# Patient Record
Sex: Female | Born: 1945
Health system: Southern US, Community
[De-identification: ages and names within clinical notes are randomized; demographics above are authoritative.]

## PROBLEM LIST (undated history)

## (undated) DIAGNOSIS — E785 Hyperlipidemia, unspecified: Secondary | ICD-10-CM

## (undated) DIAGNOSIS — M797 Fibromyalgia: Secondary | ICD-10-CM

## (undated) DIAGNOSIS — M8580 Other specified disorders of bone density and structure, unspecified site: Secondary | ICD-10-CM

## (undated) DIAGNOSIS — I10 Essential (primary) hypertension: Secondary | ICD-10-CM

## (undated) DIAGNOSIS — I639 Cerebral infarction, unspecified: Secondary | ICD-10-CM

## (undated) DIAGNOSIS — M1990 Unspecified osteoarthritis, unspecified site: Secondary | ICD-10-CM

## (undated) DIAGNOSIS — H269 Unspecified cataract: Secondary | ICD-10-CM

## (undated) DIAGNOSIS — H409 Unspecified glaucoma: Secondary | ICD-10-CM

## (undated) HISTORY — DX: Essential (primary) hypertension: I10

## (undated) HISTORY — DX: Unspecified osteoarthritis, unspecified site: M19.90

## (undated) HISTORY — PX: CATARACT EXTRACTION: SUR2

## (undated) HISTORY — DX: Cerebral infarction, unspecified: I63.9

## (undated) HISTORY — DX: Other specified disorders of bone density and structure, unspecified site: M85.80

## (undated) HISTORY — PX: CYSTOCELE REPAIR: SHX163

## (undated) HISTORY — DX: Unspecified cataract: H26.9

## (undated) HISTORY — DX: Unspecified glaucoma: H40.9

## (undated) HISTORY — PX: ROTATOR CUFF REPAIR: SHX139

## (undated) HISTORY — PX: ABDOMINAL HYSTERECTOMY: SHX81

## (undated) HISTORY — PX: COLONOSCOPY: SHX174

## (undated) HISTORY — DX: Hyperlipidemia, unspecified: E78.5

---

## 2008-03-29 LAB — CONVERTED CEMR LAB: Pap Smear: NORMAL

## 2008-04-07 ENCOUNTER — Encounter: Payer: Self-pay | Admitting: Internal Medicine

## 2009-11-30 ENCOUNTER — Other Ambulatory Visit: Admission: RE | Admit: 2009-11-30 | Discharge: 2009-11-30 | Payer: Self-pay | Admitting: Internal Medicine

## 2009-11-30 ENCOUNTER — Ambulatory Visit: Payer: Self-pay | Admitting: Internal Medicine

## 2009-11-30 DIAGNOSIS — F329 Major depressive disorder, single episode, unspecified: Secondary | ICD-10-CM

## 2009-11-30 DIAGNOSIS — M810 Age-related osteoporosis without current pathological fracture: Secondary | ICD-10-CM | POA: Insufficient documentation

## 2009-11-30 DIAGNOSIS — F3289 Other specified depressive episodes: Secondary | ICD-10-CM | POA: Insufficient documentation

## 2009-11-30 DIAGNOSIS — I10 Essential (primary) hypertension: Secondary | ICD-10-CM | POA: Insufficient documentation

## 2009-11-30 DIAGNOSIS — E785 Hyperlipidemia, unspecified: Secondary | ICD-10-CM | POA: Insufficient documentation

## 2009-11-30 LAB — CONVERTED CEMR LAB
ALT: 20 units/L (ref 0–35)
AST: 21 units/L (ref 0–37)
Albumin: 4.7 g/dL (ref 3.5–5.2)
Alkaline Phosphatase: 66 units/L (ref 39–117)
BUN: 18 mg/dL (ref 6–23)
Basophils Absolute: 0 10*3/uL (ref 0.0–0.1)
Basophils Relative: 0.7 % (ref 0.0–3.0)
Bilirubin, Direct: <0.1 mg/dL (ref 0.0–0.3)
CO2: 17 meq/L — ABNORMAL LOW (ref 19–32)
Calcium: 9.8 mg/dL (ref 8.4–10.5)
Chloride: 106 meq/L (ref 96–112)
Cholesterol, target level: 200 mg/dL
Cholesterol: 197 mg/dL (ref 0–200)
Creatinine, Ser: 0.82 mg/dL (ref 0.40–1.20)
Eosinophils Absolute: 0.1 10*3/uL (ref 0.0–0.7)
Glucose, Bld: 71 mg/dL (ref 70–99)
Hemoglobin, Urine: NEGATIVE
Hemoglobin: 13.3 g/dL (ref 12.0–15.0)
Ketones, ur: NEGATIVE mg/dL
LDL Goal: 130 mg/dL
Leukocytes, UA: NEGATIVE
MCHC: 33.1 g/dL (ref 30.0–36.0)
Monocytes Relative: 9.2 % (ref 3.0–12.0)
Neutrophils Relative %: 51.9 % (ref 43.0–77.0)
Platelets: 186 10*3/uL (ref 150.0–400.0)
Potassium: 4 meq/L (ref 3.5–5.3)
RDW: 13.1 % (ref 11.5–14.6)
Specific Gravity, Urine: 1.025 (ref 1.000–1.030)
Total Protein, Urine: NEGATIVE mg/dL
Total Protein: 7.8 g/dL (ref 6.0–8.3)
pH: 5.5 (ref 5.0–8.0)

## 2009-12-02 ENCOUNTER — Encounter: Payer: Self-pay | Admitting: Internal Medicine

## 2009-12-04 ENCOUNTER — Telehealth (INDEPENDENT_AMBULATORY_CARE_PROVIDER_SITE_OTHER): Payer: Self-pay | Admitting: *Deleted

## 2009-12-05 ENCOUNTER — Encounter: Payer: Self-pay | Admitting: Internal Medicine

## 2009-12-13 ENCOUNTER — Telehealth (INDEPENDENT_AMBULATORY_CARE_PROVIDER_SITE_OTHER): Payer: Self-pay | Admitting: *Deleted

## 2009-12-19 ENCOUNTER — Encounter: Admission: RE | Admit: 2009-12-19 | Discharge: 2009-12-19 | Payer: Self-pay | Admitting: Internal Medicine

## 2009-12-19 LAB — HM MAMMOGRAPHY

## 2010-03-20 ENCOUNTER — Ambulatory Visit: Payer: Self-pay | Admitting: Internal Medicine

## 2010-03-20 DIAGNOSIS — J3089 Other allergic rhinitis: Secondary | ICD-10-CM | POA: Insufficient documentation

## 2010-03-20 DIAGNOSIS — J019 Acute sinusitis, unspecified: Secondary | ICD-10-CM | POA: Insufficient documentation

## 2010-05-13 ENCOUNTER — Telehealth: Payer: Self-pay | Admitting: Internal Medicine

## 2010-12-17 NOTE — Progress Notes (Signed)
Summary: Medical authorization received  Medical authorization received and faxed to Dr. Weldon Picking @ [phone]. Wilder Glade  December 04, 2009 4:47 PM

## 2010-12-17 NOTE — Assessment & Plan Note (Signed)
Summary: sore throat, achey-lb   Vital Signs:  Patient profile:   65 year old female Height:      64 inches Weight:      144 pounds BMI:     24.81 O2 Sat:      94 % on Room air Temp:     98.5 degrees F oral Pulse rate:   56 / minute Pulse rhythm:   regular Resp:     16 per minute BP sitting:   140 / 82  (left arm) Cuff size:   large  Vitals Entered By: Rock Nephew CMA (Mar 20, 2010 4:00 PM)  O2 Flow:  Room air CC: allergies, headache, Bilateral ear pressure, sore throat, URI symptoms Is Patient Diabetic? No Pain Assessment Patient in pain? no        Primary Care Provider:  Etta Grandchild MD  CC:  allergies, headache, Bilateral ear pressure, sore throat, and URI symptoms.  History of Present Illness:  URI Symptoms      This is a 65 year old woman who presents with URI symptoms.  The symptoms began 5 days ago.  The severity is described as mild.  The patient reports nasal congestion, purulent nasal discharge, sore throat, and productive cough, but denies earache and sick contacts.  The patient denies fever, stiff neck, dyspnea, wheezing, rash, vomiting, diarrhea, use of an antipyretic, and response to antipyretic.  The patient also reports itchy throat, sneezing, and seasonal symptoms.  The patient denies headache, muscle aches, and severe fatigue.    Preventive Screening-Counseling & Management  Alcohol-Tobacco     Alcohol drinks/day: 1     Alcohol type: wine     >5/day in last 3 mos: no     Alcohol Counseling: not indicated; use of alcohol is not excessive or problematic     Feels need to cut down: no     Feels annoyed by complaints: no     Feels guilty re: drinking: no     Needs 'eye opener' in am: no     Smoking Status: never  Caffeine-Diet-Exercise     Does Patient Exercise: no  Hep-HIV-STD-Contraception     Hepatitis Risk: no risk noted     HIV Risk: no risk noted     STD Risk: no risk noted     SBE monthly: yes     SBE Education/Counseling: to  perform regular SBE      Sexual History:  currently monogamous.        Drug Use:  no.        Blood Transfusions:  no.    Medications Prior to Update: 1)  Lotrel 5-10 Mg Caps (Amlodipine Besy-Benazepril Hcl) .... Once Daily 2)  Lumigan 0.03 % Soln (Bimatoprost) 3)  Estradiol 0.5 Mg Tabs (Estradiol) .... Once Daily 4)  Simvastatin 20 Mg Tabs (Simvastatin) .... Once Daily 5)  Baby Asa 6)  Vitamin D3 2000iu 7)  Citalopram Hydrobromide 20 Mg Tabs (Citalopram Hydrobromide) .... Once Daily 8)  Atelvia .... One By Mouth Once A Week For Osteoporosis  Current Medications (verified): 1)  Lotrel 5-10 Mg Caps (Amlodipine Besy-Benazepril Hcl) .... Once Daily 2)  Lumigan 0.03 % Soln (Bimatoprost) 3)  Estradiol 0.5 Mg Tabs (Estradiol) .... Once Daily 4)  Simvastatin 20 Mg Tabs (Simvastatin) .... Once Daily 5)  Baby Asa 6)  Vitamin D3 2000iu 7)  Citalopram Hydrobromide 20 Mg Tabs (Citalopram Hydrobromide) .... Once Daily 8)  Cefuroxime Axetil 500 Mg Tabs (Cefuroxime Axetil) .... One By  Mouth Two Times A Day For 10 Days  Allergies (verified): 1)  ! Erythromycin  Past History:  Past Medical History: Reviewed history from 11/30/2009 and no changes required. Hyperlipidemia Hypertension Osteoporosis  Family History: n/a  Social History: Married Never Smoked Alcohol use-no Drug use-no Regular exercise-no Drug Use:  no Does Patient Exercise:  no  Review of Systems  The patient denies anorexia, chest pain, dyspnea on exertion, prolonged cough, headaches, hemoptysis, abdominal pain, hematuria, difficulty walking, depression, and enlarged lymph nodes.    Physical Exam  General:  alert, well-developed, well-nourished, well-hydrated, appropriate dress, normal appearance, healthy-appearing, cooperative to examination, and good hygiene.   Head:  normocephalic, atraumatic, no abnormalities observed, and no abnormalities palpated.   Ears:  R ear normal and L ear normal.   Nose:  no airflow  obstruction, no intranasal foreign body, no nasal polyps, no nasal mucosal lesions, no mucosal friability, no active bleeding or clots, no sinus percussion tenderness, no septum abnormalities, nasal dischargemucosal pallor, and mucosal edema.   Mouth:  no exudates, no posterior lymphoid hypertrophy, no postnasal drip, no pharyngeal crowing, no lesions, no aphthous ulcers, no erosions, no tongue abnormalities, no leukoplakia, and pharyngeal erythema.   Neck:  No deformities, masses, or tenderness noted. Lungs:  Normal respiratory effort, chest expands symmetrically. Lungs are clear to auscultation, no crackles or wheezes. Heart:  Normal rate and regular rhythm. S1 and S2 normal without gallop, murmur, click, rub or other extra sounds. Abdomen:  Bowel sounds positive,abdomen soft and non-tender without masses, organomegaly or hernias noted. Msk:  No deformity or scoliosis noted of thoracic or lumbar spine.   Pulses:  R and L carotid,radial,femoral,dorsalis pedis and posterior tibial pulses are full and equal bilaterally Extremities:  No clubbing, cyanosis, edema, or deformity noted with normal full range of motion of all joints.   Neurologic:  No cranial nerve deficits noted. Station and gait are normal. Plantar reflexes are down-going bilaterally. DTRs are symmetrical throughout. Sensory, motor and coordinative functions appear intact. Skin:  Intact without suspicious lesions or rashes Cervical Nodes:  no anterior cervical adenopathy and no posterior cervical adenopathy.   Psych:  Cognition and judgment appear intact. Alert and cooperative with normal attention span and concentration. No apparent delusions, illusions, hallucinations   Impression & Recommendations:  Problem # 1:  SINUSITIS- ACUTE-NOS (ICD-461.9) Assessment New continue otc anti-histamines and decongestants Her updated medication list for this problem includes:    Cefuroxime Axetil 500 Mg Tabs (Cefuroxime axetil) ..... One by mouth  two times a day for 10 days  Instructed on treatment. Call if symptoms persist or worsen.   Problem # 2:  ALLERGIC RHINITIS DUE TO OTHER ALLERGEN (ICD-477.8) Assessment: New  Orders: Depo- Medrol 80mg  (J1040) Admin of Therapeutic Inj  intramuscular or subcutaneous (09811)  Complete Medication List: 1)  Lotrel 5-10 Mg Caps (Amlodipine besy-benazepril hcl) .... Once daily 2)  Lumigan 0.03 % Soln (Bimatoprost) 3)  Estradiol 0.5 Mg Tabs (Estradiol) .... Once daily 4)  Simvastatin 20 Mg Tabs (Simvastatin) .... Once daily 5)  Baby Asa  6)  Vitamin D3 2000iu  7)  Citalopram Hydrobromide 20 Mg Tabs (Citalopram hydrobromide) .... Once daily 8)  Cefuroxime Axetil 500 Mg Tabs (Cefuroxime axetil) .... One by mouth two times a day for 10 days  Patient Instructions: 1)  Please schedule a follow-up appointment in 1 month. 2)  Acute sinusitis symptoms for less than 10 days are not helped by antibiotics.Use warm moist compresses, and over the counter decongestants ( only  as directed). Call if no improvement in 5-7 days, sooner if increasing pain, fever, or new symptoms. Prescriptions: CEFUROXIME AXETIL 500 MG TABS (CEFUROXIME AXETIL) One by mouth two times a day for 10 days  #20 x 0   Entered and Authorized by:   Etta Grandchild MD   Signed by:   Etta Grandchild MD on 03/20/2010   Method used:   Electronically to        [facility] [phone]* (retail)       [address]       [place], [place]  [address]       Ph: [phone] or [phone]       Fax: [phone]   RxID:   [id number]    Medication Administration  Injection # 1:    Medication: Depo- Medrol 80mg     Diagnosis: ALLERGIC RHINITIS DUE TO OTHER ALLERGEN (ICD-477.8)    Route: IM    Site: LUOQ gluteus    Exp Date: 09/17/2012    Lot #: [id number]    Mfr: [employer]    Comments: Patient received a total of 120mg  Depo.    Patient tolerated injection without complications    Given by: Lucious Groves  (Mar 20, 2010 4:20 PM)  Injection # 2:    Medication: Depo- Medrol 40mg     Diagnosis: ALLERGIC RHINITIS DUE TO OTHER ALLERGEN (ICD-477.8)    Route: IM    Site: LUOQ gluteus    Exp Date: 09/17/2012    Lot #: [id number]    Mfr: [employer]  Orders Added: 1)  Depo- Medrol 80mg  [J1040] 2)  Admin of Therapeutic Inj  intramuscular or subcutaneous [96372] 3)  Est. Patient Level IV [43329]

## 2010-12-17 NOTE — Assessment & Plan Note (Signed)
Summary: NEW PT PHY W/PAP--NO GYN--BCBS-#-PKG-STC--RS'D/CD   Vital Signs:  Patient profile:   65 year old female Height:      64 inches Weight:      142 pounds BMI:     24.46 O2 Sat:      94 % on Room air Temp:     98.2 degrees F oral Pulse rate:   51 / minute Pulse rhythm:   regular Resp:     16 per minute BP sitting:   112 / 82  (left arm) Cuff size:   large  Vitals Entered By: Rock Nephew CMA (November 30, 2009 10:33 AM)  O2 Flow:  Room air  Primary Care Provider:  Etta Grandchild MD   History of Present Illness: New to me for a complete physical. 1. She has osteoporosis and wants to restart meds for this 2. She is having anxiety and insomnia (FA's) and wants to restart Citalopram  Depression History:      The patient presents with symptoms of depression which have been present for greater than two weeks.  The patient is having a depressed mood most of the day and has a diminished interest in her usual daily activities.  Positive alarm features for depression include insomnia, fatigue (loss of energy), and feelings of worthlessness (guilt).  However, she denies significant weight loss, significant weight gain, hypersomnia, psychomotor agitation, psychomotor retardation, impaired concentration (indecisiveness), and recurrent thoughts of death or suicide.  The patient denies symptoms of a manic disorder including persistently & abnormally elevated mood, abnormally & persistently irritable mood, less need for sleep, talkative or feels need to keep talking, distractibility, increase in goal-directed activity, psychomotor agitation, inflated self-esteem or grandiosity, excessive buying sprees, and excessive sexual indiscretions.        Psychosocial stress factors include a recent divorce and major life changes.  Risk factors for depression include a personal history of depression.  The patient denies that she feels like life is not worth living, denies that she wishes that she were  dead, and denies that she has thought about ending her life.         Depression Treatment History:  Prior Medication Used:   Start Date: Assessment of Effect:   Comments:  Celexa (citalopram)     11/30/2007   much improvement     --  Hypertension History:      She denies headache, chest pain, palpitations, dyspnea with exertion, orthopnea, PND, peripheral edema, visual symptoms, neurologic problems, syncope, and side effects from treatment.  She notes no problems with any antihypertensive medication side effects.        Positive major cardiovascular risk factors include female age 25 years old or older, hyperlipidemia, and hypertension.  Negative major cardiovascular risk factors include no history of diabetes, negative family history for ischemic heart disease, and non-tobacco-user status.        Further assessment for target organ damage reveals no history of ASHD, cardiac end-organ damage (CHF/LVH), stroke/TIA, peripheral vascular disease, renal insufficiency, or hypertensive retinopathy.    Lipid Management History:      Positive NCEP/ATP III risk factors include female age 38 years old or older and hypertension.  Negative NCEP/ATP III risk factors include non-diabetic, no family history for ischemic heart disease, non-tobacco-user status, no ASHD (atherosclerotic heart disease), no prior stroke/TIA, no peripheral vascular disease, and no history of aortic aneurysm.        The patient states that she knows about the "Therapeutic Lifestyle Change" diet.  Her  compliance with the TLC diet is excellent.  The patient expresses understanding of adjunctive measures for cholesterol lowering.  Adjunctive measures started by the patient include aerobic exercise, fiber, ASA, omega-3 supplements, limit alcohol consumpton, and weight reduction.  She expresses no side effects from her lipid-lowering medication.  The patient denies any symptoms to suggest myopathy or liver disease.       Preventive  Screening-Counseling & Management  Alcohol-Tobacco     Alcohol drinks/day: 1     Alcohol type: wine     >5/day in last 3 mos: no     Alcohol Counseling: not indicated; use of alcohol is not excessive or problematic     Feels need to cut down: no     Feels annoyed by complaints: no     Feels guilty re: drinking: no     Needs 'eye opener' in am: no     Smoking Status: never  Caffeine-Diet-Exercise     Does Patient Exercise: yes     Type of exercise: cv     Exercise (avg: min/session): 40     Times/week: 4  Hep-HIV-STD-Contraception     Hepatitis Risk: no risk noted     HIV Risk: no risk noted     STD Risk: no risk noted     SBE monthly: yes     SBE Education/Counseling: to perform regular SBE      Sexual History:  currently monogamous.        Drug Use:  never.        Blood Transfusions:  no.    Current Medications (verified): 1)  Lotrel 5-10 Mg Caps (Amlodipine Besy-Benazepril Hcl) 2)  Lumigan 0.03 % Soln (Bimatoprost) 3)  Estradiol 0.5 Mg Tabs (Estradiol) 4)  Simvastatin 20 Mg Tabs (Simvastatin) 5)  Baby Asa 6)  Vitamin D3 2000iu  Allergies (verified): 1)  ! Erythromycin  Past History:  Past Medical History: Hyperlipidemia Hypertension Osteoporosis  Social History: Smoking Status:  never Does Patient Exercise:  yes Hepatitis Risk:  no risk noted HIV Risk:  no risk noted STD Risk:  no risk noted Sexual History:  currently monogamous Drug Use:  never Blood Transfusions:  no  Review of Systems       The patient complains of depression.  The patient denies anorexia, fever, weight loss, weight gain, chest pain, syncope, dyspnea on exertion, peripheral edema, prolonged cough, headaches, hemoptysis, abdominal pain, melena, hematochezia, severe indigestion/heartburn, hematuria, incontinence, muscle weakness, suspicious skin lesions, difficulty walking, enlarged lymph nodes, angioedema, and breast masses.    Physical Exam  General:  alert, well-developed,  well-nourished, well-hydrated, appropriate dress, normal appearance, healthy-appearing, cooperative to examination, and good hygiene.   Head:  normocephalic, atraumatic, no abnormalities observed, and no abnormalities palpated.   Eyes:  No corneal or conjunctival inflammation noted. EOMI. Perrla. Funduscopic exam benign, without hemorrhages, exudates or papilledema. Vision grossly normal. Mouth:  Oral mucosa and oropharynx without lesions or exudates.  Teeth in good repair. Neck:  supple, full ROM, no masses, no thyromegaly, no thyroid nodules or tenderness, no JVD, normal carotid upstroke, no carotid bruits, no cervical lymphadenopathy, and no neck tenderness.   Breasts:  No mass, nodules, thickening, tenderness, bulging, retraction, inflamation, nipple discharge or skin changes noted.   Lungs:  normal respiratory effort, no intercostal retractions, no accessory muscle use, normal breath sounds, no dullness, no fremitus, no crackles, and no wheezes.   Heart:  normal rate, regular rhythm, no murmur, no gallop, and no rub.   Abdomen:  soft,  non-tender, normal bowel sounds, no distention, no masses, no guarding, no rigidity, no rebound tenderness, no hepatomegaly, no splenomegaly, and abdominal scar(s).   Rectal:  No external abnormalities noted. Normal sphincter tone. No rectal masses or tenderness. heme negative stool. Genitalia:  normal introitus, no external lesions, no vaginal discharge, mucosa pink and moist, no friaility or hemorrhage, and no adnexal masses or tenderness.   Msk:  normal ROM, no joint tenderness, no joint swelling, no joint warmth, no redness over joints, no joint deformities, no joint instability, and no crepitation.   Pulses:  R and L carotid,radial,femoral,dorsalis pedis and posterior tibial pulses are full and equal bilaterally Extremities:  No clubbing, cyanosis, edema, or deformity noted with normal full range of motion of all joints.   Neurologic:  No cranial nerve deficits  noted. Station and gait are normal. Plantar reflexes are down-going bilaterally. DTRs are symmetrical throughout. Sensory, motor and coordinative functions appear intact. Skin:  turgor normal, color normal, no rashes, no suspicious lesions, no ecchymoses, no petechiae, no purpura, no ulcerations, and no edema.   Cervical Nodes:  no anterior cervical adenopathy and no posterior cervical adenopathy.   Axillary Nodes:  no R axillary adenopathy and no L axillary adenopathy.   Inguinal Nodes:  no R inguinal adenopathy and no L inguinal adenopathy.   Psych:  Oriented X3, memory intact for recent and remote, good eye contact, not anxious appearing, not depressed appearing, not agitated, not suicidal, not homicidal, and subdued.   Additional Exam:  EKG is normal.   Impression & Recommendations:  Problem # 1:  ROUTINE GENERAL MEDICAL EXAM@HEALTH  CARE FACL (ICD-V70.0) Assessment New  Orders: Radiology Referral (Radiology) Hemoccult Guaiac-1 spec.(in office) (82270) EKG w/ Interpretation (93000)  Mammogram: Normal Bilateral (04/04/2008) Pap smear: Normal (03/29/2008) Colonoscopy: Normal (03/23/2007) Bone Density: Osteoporosis (04/05/2008)  Discussed using sunscreen, use of alcohol, drug use, self breast exam, routine dental care, routine eye care, schedule for GYN exam, routine physical exam, seat belts, multiple vitamins, osteoporosis prevention, adequate calcium intake in diet, recommendations for immunizations, mammograms and Pap smears.  Discussed exercise and checking cholesterol.    Problem # 2:  OSTEOPOROSIS (ICD-733.00)  Orders: T-Bone Densitometry (16109) Venipuncture (60454) TLB-Lipid Panel (80061-LIPID) TLB-BMP (Basic Metabolic Panel-BMET) (80048-METABOL) TLB-CBC Platelet - w/Differential (85025-CBCD) TLB-Hepatic/Liver Function Pnl (80076-HEPATIC) TLB-TSH (Thyroid Stimulating Hormone) (84443-TSH) TLB-Udip w/ Micro (81001-URINE) T-Vitamin D (25-Hydroxy) (09811-91478)  Discussed  medication use, applications of heat or ice, and exercises.   Problem # 3:  HYPERTENSION (ICD-401.9) Assessment: Improved  Her updated medication list for this problem includes:    Lotrel 5-10 Mg Caps (Amlodipine besy-benazepril hcl) ..... Once daily  Orders: Venipuncture (29562) TLB-Lipid Panel (80061-LIPID) TLB-BMP (Basic Metabolic Panel-BMET) (80048-METABOL) TLB-CBC Platelet - w/Differential (85025-CBCD) TLB-Hepatic/Liver Function Pnl (80076-HEPATIC) TLB-TSH (Thyroid Stimulating Hormone) (84443-TSH) TLB-Udip w/ Micro (81001-URINE) T-Vitamin D (25-Hydroxy) (13086-57846)  Problem # 4:  HYPERLIPIDEMIA (ICD-272.4) Assessment: Unchanged  Her updated medication list for this problem includes:    Simvastatin 20 Mg Tabs (Simvastatin) ..... Once daily  Orders: Venipuncture (96295) TLB-Lipid Panel (80061-LIPID) TLB-BMP (Basic Metabolic Panel-BMET) (80048-METABOL) TLB-CBC Platelet - w/Differential (85025-CBCD) TLB-Hepatic/Liver Function Pnl (80076-HEPATIC) TLB-TSH (Thyroid Stimulating Hormone) (84443-TSH) TLB-Udip w/ Micro (81001-URINE) T-Vitamin D (25-Hydroxy) (28413-24401)  Problem # 5:  DEPRESSIVE DISORDER (ICD-311) Assessment: Deteriorated  Her updated medication list for this problem includes:    Citalopram Hydrobromide 20 Mg Tabs (Citalopram hydrobromide) ..... Once daily  Discussed treatment options, including trial of antidpressant medication. Will refer to behavioral health. Follow-up call in in 24-48 hours and recheck  in 2 weeks, sooner as needed. Patient agrees to call if any worsening of symptoms or thoughts of doing harm arise. Verified that the patient has no suicidal ideation at this time.   Complete Medication List: 1)  Lotrel 5-10 Mg Caps (Amlodipine besy-benazepril hcl) .... Once daily 2)  Lumigan 0.03 % Soln (Bimatoprost) 3)  Estradiol 0.5 Mg Tabs (Estradiol) .... Once daily 4)  Simvastatin 20 Mg Tabs (Simvastatin) .... Once daily 5)  Baby Asa  6)  Vitamin  D3 2000iu  7)  Citalopram Hydrobromide 20 Mg Tabs (Citalopram hydrobromide) .... Once daily 8)  Atelvia  .... One by mouth once a week for osteoporosis  Hypertension Assessment/Plan:      The patient's hypertensive risk group is category B: At least one risk factor (excluding diabetes) with no target organ damage.  Today's blood pressure is 112/82.  Her blood pressure goal is < 140/90.  Lipid Assessment/Plan:      Based on NCEP/ATP III, the patient's risk factor category is "2 or more risk factors and a calculated 10 year CAD risk of > 20%".  The patient's lipid goals are as follows: Total cholesterol goal is 200; LDL cholesterol goal is 130; HDL cholesterol goal is 40; Triglyceride goal is 150.    Colorectal Screening:  Current Recommendations:    Hemoccult: NEG X 1 today  Colonoscopy Results:    Date of Exam: 03/23/2007    Results: Normal  PAP Screening:    Hx Cervical Dysplasia in last 5 yrs? No    3 normal PAP smears in last 5 yrs? Yes    Last PAP smear:  03/29/2008  PAP Smear Results:    Date of Exam:  03/29/2008    Results:  Normal  Mammogram Screening:    Last Mammogram:  04/04/2008  Mammogram Results:    Date of Exam:  04/04/2008    Results:  Normal Bilateral  Osteoporosis Risk Assessment:  Risk Factors for Fracture or Low Bone Density:   Race (White or Asian):     yes   Hx of Fractures:       no   FH of Osteoporosis:     yes   Hx of falls:       no   Physically inactive:     no   Smoking status:       never   High alcohol use:     no   High caffeine use:     no   Low calcium/Vit. D intake:   yes   Corticosteroid use:     no   Thyroid replacement:     no   Dilantin use:       no   Heparin use:       no   Thyroid disease:     no   Rheumatoid Arthritis:     no   Parathyroid disease:     no  Bone Density (DEXA Scan) Results:    Date of Exam:  04/05/2008    Results:  Osteoporosis  Hormone Replacement Therapy Discussion:    Reviewed risks and benefits of  hormone replacement therapy; based on discussion patient wants to continue HRT.  Patient Instructions: 1)  Please schedule a follow-up appointment in 4 months. 2)  It is important that you exercise regularly at least 20 minutes 5 times a week. If you develop chest pain, have severe difficulty breathing, or feel very tired , stop exercising immediately and seek medical attention. 3)  Schedule your  mammogram. 4)  You need to have a Pap Smear to prevent cervical cancer. 5)  Take calcium +Vitamin D daily. 6)  Check your Blood Pressure regularly. If it is above 140/90: you should make an appointment. Prescriptions: SIMVASTATIN 20 MG TABS (SIMVASTATIN) once daily  #90 x 3   Entered and Authorized by:   Etta Grandchild MD   Signed by:   Etta Grandchild MD on 11/30/2009   Method used:   Electronically to        [facility]* (mail-order)             ,          Ph: [phone]       Fax: [phone]   RxID:   [id number] ESTRADIOL 0.5 MG TABS (ESTRADIOL) once daily  #90 x 3   Entered and Authorized by:   Etta Grandchild MD   Signed by:   Etta Grandchild MD on 11/30/2009   Method used:   Electronically to        [facility]* (mail-order)             ,          Ph: [phone]       Fax: [phone]   RxID:   [id number] LOTREL 5-10 MG CAPS (AMLODIPINE BESY-BENAZEPRIL HCL) once daily  #90 x 3   Entered and Authorized by:   Etta Grandchild MD   Signed by:   Etta Grandchild MD on 11/30/2009   Method used:   Electronically to        [facility]* (mail-order)             ,          Ph: [phone]       Fax: [phone]   RxID:   [id number] ATELVIA One by mouth once a week for osteoporosis  #6 x 0   Entered and Authorized by:   Etta Grandchild MD   Signed by:   Etta Grandchild MD on 11/30/2009   Method used:   Samples Given   RxID:   [id number] CITALOPRAM HYDROBROMIDE 20 MG TABS (CITALOPRAM HYDROBROMIDE) once daily  #30 x 11   Entered and Authorized by:    Etta Grandchild MD   Signed by:   Etta Grandchild MD on 11/30/2009   Method used:   Print then Give to Patient   RxID:   [id number]   Preventive Care Screening  Pap Smear:    Date:  03/17/2008    Results:  normal   Mammogram:    Date:  04/07/2000    Results:  normal      Appended Document: Orders Update    Clinical Lists Changes  Orders: Added new Test order of T- * Misc. Laboratory test [phone]) - Signed

## 2010-12-17 NOTE — Progress Notes (Signed)
Summary: REFILL   Phone Note Refill Request Call back at Home Phone [phone]   Refills Requested: Medication #1:  CITALOPRAM HYDROBROMIDE 20 MG TABS once daily. Patient is requesting rx to go to [facility] and would like a call back when complete. Ok for year of refills?   Initial call taken by: Lamar Sprinkles, CMA,  May 13, 2010 9:38 AM  Follow-up for Phone Call        yes Follow-up by: Etta Grandchild MD,  May 13, 2010 11:16 AM  Additional Follow-up for Phone Call Additional follow up Details #1::        Pt informed  Additional Follow-up by: Lamar Sprinkles, CMA,  May 13, 2010 2:56 PM    Prescriptions: CITALOPRAM HYDROBROMIDE 20 MG TABS (CITALOPRAM HYDROBROMIDE) once daily  #90 x 3   Entered by:   Lamar Sprinkles, CMA   Authorized by:   Etta Grandchild MD   Signed by:   Lamar Sprinkles, CMA on 05/13/2010   Method used:   Faxed to ...       [facility] (mail-order)             , [place]         Ph:        Fax: [phone]   RxID:   [id number]

## 2010-12-17 NOTE — Letter (Signed)
Summary: Lipid Letter  [facility]  [address] [place], [place] [address]   Phone: [phone]  Fax: [phone]    12/02/2009  Pam Scott [address] [place], [place]  [address]  Dear Pam Scott:  We have carefully reviewed your last lipid profile from 11/30/2009 and the results are noted below with a summary of recommendations for lipid management.    Cholesterol:       197     Goal: <200   HDL "good" Cholesterol:   89     Goal: >40   LDL "bad" Cholesterol:   98     Goal: <130   Triglycerides:       51     Goal: <150 EXCELLENT RESULTS   kidney and liver tests are normal and your Vitamin D level is good.    TLC Diet (Therapeutic Lifestyle Change): Saturated Fats & Transfatty acids should be kept < 7% of total calories ***Reduce Saturated Fats Polyunstaurated Fat can be up to 10% of total calories Monounsaturated Fat Fat can be up to 20% of total calories Total Fat should be no greater than 25-35% of total calories Carbohydrates should be 50-60% of total calories Protein should be approximately 15% of total calories Fiber should be at least 20-30 grams a day ***Increased fiber may help lower LDL Total Cholesterol should be < 200mg /day Consider adding plant stanol/sterols to diet (example: Benacol spread) ***A higher intake of unsaturated fat may reduce Triglycerides and Increase HDL    Adjunctive Measures (may lower LIPIDS and reduce risk of Heart Attack) include: Aerobic Exercise (20-30 minutes 3-4 times a week) Limit Alcohol Consumption Weight Reduction Aspirin 75-81 mg a day by mouth (if not allergic or contraindicated) Dietary Fiber 20-30 grams a day by mouth     Current Medications: 1)    Lotrel 5-10 Mg Caps (Amlodipine besy-benazepril hcl) .... Once daily 2)    Lumigan 0.03 % Soln (Bimatoprost) 3)    Estradiol 0.5 Mg Tabs (Estradiol) .... Once daily 4)    Simvastatin 20 Mg Tabs (Simvastatin) .... Once daily 5)    Baby Asa    6)    Vitamin D3 2000iu  7)    Citalopram Hydrobromide 20 Mg Tabs (Citalopram hydrobromide) .... Once daily 8)    Atelvia  .... One by mouth once a week for osteoporosis  If you have any questions, please call. We appreciate being able to work with you.   Sincerely,    [facility] Etta Grandchild MD

## 2010-12-17 NOTE — Letter (Signed)
Summary: Results Follow-up Letter  [facility]  [address] [place], [place] [address]   Phone: [phone]  Fax: [phone]    12/05/2009  [address] [place], [place]  [address]  Dear Ms. Adderly,   The following are the results of your recent test(s):  Test     Result     Pap Smear    Normal____x___  Not Normal_____       Comments: _________________________________________________________   _________________________________________________________  Please call for an appointment as directed _________________________________________________________ _________________________________________________________ _________________________________________________________  Sincerely,  Sanda Linger MD [facility]

## 2010-12-17 NOTE — Progress Notes (Signed)
Summary: Record received  45 pages of records received from Dr. Sanjuana Kava office. Forwarded to Dr. Yetta Barre for review. [id number] aware.

## 2011-01-13 ENCOUNTER — Other Ambulatory Visit: Payer: Self-pay | Admitting: Internal Medicine

## 2011-01-13 DIAGNOSIS — Z1231 Encounter for screening mammogram for malignant neoplasm of breast: Secondary | ICD-10-CM

## 2011-01-22 ENCOUNTER — Ambulatory Visit
Admission: RE | Admit: 2011-01-22 | Discharge: 2011-01-22 | Disposition: A | Discharge status: Home / Self Care | Payer: BC Managed Care – PPO | Source: Ambulatory Visit | Attending: Internal Medicine | Admitting: Internal Medicine

## 2011-01-22 DIAGNOSIS — Z1231 Encounter for screening mammogram for malignant neoplasm of breast: Secondary | ICD-10-CM

## 2011-01-23 ENCOUNTER — Telehealth: Payer: Self-pay | Admitting: Internal Medicine

## 2011-01-28 NOTE — Progress Notes (Signed)
    PAP Screening:    Last PAP smear:  11/30/2009  Mammogram Screening:    Last Mammogram:  01/22/2011  Mammogram Results:    Date of Exam:  01/22/2011    Results:  Normal Bilateral  Osteoporosis Risk Assessment:  Risk Factors for Fracture or Low Bone Density:   Race (White or Asian):     yes   Hx of Fractures:       no   FH of Osteoporosis:     yes   Hx of falls:       no   Physically inactive:     no   Smoking status:       never   High alcohol use:     no   Low calcium/Vit. D intake:   yes   Corticosteroid use:     no   Heparin use:       no   Thyroid disease:     no   Rheumatoid Arthritis:     no

## 2011-03-01 ENCOUNTER — Other Ambulatory Visit: Payer: Self-pay | Admitting: Internal Medicine

## 2011-03-12 ENCOUNTER — Telehealth: Payer: Self-pay

## 2011-03-12 MED ORDER — AMLODIPINE BESY-BENAZEPRIL HCL 5-10 MG PO CAPS: 1.0000 | ORAL_CAPSULE | Freq: Every day | ORAL | Status: DC

## 2011-03-12 NOTE — Telephone Encounter (Signed)
Patient called due to medication being denied. She has made appt for cpx (04/02/11) and I advised that we will send in enough to last until her appt. I lmovm advising that due to this not being a 90day suppply it will be sent to [facility] unless she calls back and given a different local pharmacy

## 2011-03-17 ENCOUNTER — Ambulatory Visit: Payer: BC Managed Care – PPO | Admitting: Internal Medicine

## 2011-03-25 ENCOUNTER — Encounter: Payer: Self-pay | Admitting: Internal Medicine

## 2011-03-27 ENCOUNTER — Encounter: Payer: Self-pay | Admitting: Internal Medicine

## 2011-03-27 ENCOUNTER — Other Ambulatory Visit (INDEPENDENT_AMBULATORY_CARE_PROVIDER_SITE_OTHER): Payer: BC Managed Care – PPO

## 2011-03-27 ENCOUNTER — Other Ambulatory Visit (INDEPENDENT_AMBULATORY_CARE_PROVIDER_SITE_OTHER): Payer: BC Managed Care – PPO | Admitting: Internal Medicine

## 2011-03-27 ENCOUNTER — Ambulatory Visit (INDEPENDENT_AMBULATORY_CARE_PROVIDER_SITE_OTHER): Payer: BC Managed Care – PPO | Admitting: Internal Medicine

## 2011-03-27 DIAGNOSIS — M810 Age-related osteoporosis without current pathological fracture: Secondary | ICD-10-CM

## 2011-03-27 DIAGNOSIS — Z23 Encounter for immunization: Secondary | ICD-10-CM

## 2011-03-27 DIAGNOSIS — Z13220 Encounter for screening for lipoid disorders: Secondary | ICD-10-CM

## 2011-03-27 DIAGNOSIS — Z0000 Encounter for general adult medical examination without abnormal findings: Secondary | ICD-10-CM

## 2011-03-27 DIAGNOSIS — T63461A Toxic effect of venom of wasps, accidental (unintentional), initial encounter: Secondary | ICD-10-CM

## 2011-03-27 DIAGNOSIS — I10 Essential (primary) hypertension: Secondary | ICD-10-CM

## 2011-03-27 DIAGNOSIS — T63441A Toxic effect of venom of bees, accidental (unintentional), initial encounter: Secondary | ICD-10-CM

## 2011-03-27 DIAGNOSIS — E785 Hyperlipidemia, unspecified: Secondary | ICD-10-CM

## 2011-03-27 DIAGNOSIS — W57XXXA Bitten or stung by nonvenomous insect and other nonvenomous arthropods, initial encounter: Secondary | ICD-10-CM

## 2011-03-27 DIAGNOSIS — T148XXA Other injury of unspecified body region, initial encounter: Secondary | ICD-10-CM

## 2011-03-27 DIAGNOSIS — T6391XA Toxic effect of contact with unspecified venomous animal, accidental (unintentional), initial encounter: Secondary | ICD-10-CM

## 2011-03-27 DIAGNOSIS — T148 Other injury of unspecified body region: Secondary | ICD-10-CM

## 2011-03-27 LAB — URINALYSIS, ROUTINE W REFLEX MICROSCOPIC
Bilirubin Urine: NEGATIVE
Ketones, ur: NEGATIVE
Nitrite: NEGATIVE
Urine Glucose: NEGATIVE
pH: 6 (ref 5.0–8.0)

## 2011-03-27 LAB — COMPREHENSIVE METABOLIC PANEL
Albumin: 4.2 g/dL (ref 3.5–5.2)
BUN: 19 mg/dL (ref 6–23)
CO2: 27 mEq/L (ref 19–32)
Calcium: 9.3 mg/dL (ref 8.4–10.5)
Chloride: 105 mEq/L (ref 96–112)
GFR: 52.19 mL/min — ABNORMAL LOW (ref >60.00)
Glucose, Bld: 84 mg/dL (ref 70–99)
Potassium: 4.2 mEq/L (ref 3.5–5.1)
Sodium: 141 mEq/L (ref 135–145)

## 2011-03-27 LAB — LIPID PANEL
Cholesterol: 230 mg/dL — ABNORMAL HIGH (ref 0–200)
Total CHOL/HDL Ratio: 3
Triglycerides: 57 mg/dL (ref 0.0–149.0)
VLDL: 11.4 mg/dL (ref 0.0–40.0)

## 2011-03-27 LAB — CBC WITH DIFFERENTIAL/PLATELET
Basophils Relative: 0.5 % (ref 0.0–3.0)
Lymphocytes Relative: 27.8 % (ref 12.0–46.0)
Lymphs Abs: 1.6 10*3/uL (ref 0.7–4.0)
Monocytes Relative: 10.6 % (ref 3.0–12.0)
Neutro Abs: 3.4 10*3/uL (ref 1.4–7.7)
WBC: 5.9 10*3/uL (ref 4.5–10.5)

## 2011-03-27 LAB — TSH: TSH: 1.2 u[IU]/mL (ref 0.35–5.50)

## 2011-03-27 MED ORDER — SIMVASTATIN 20 MG PO TABS: 20.0000 mg | ORAL_TABLET | Freq: Every day | ORAL | Status: DC

## 2011-03-27 MED ORDER — METHYLPREDNISOLONE ACETATE 80 MG/ML IJ SUSP
120.0000 mg | Freq: Once | INTRAMUSCULAR | Status: AC
  Administered 2011-03-27: 120 mg via INTRAMUSCULAR

## 2011-03-27 MED ORDER — AMLODIPINE BESY-BENAZEPRIL HCL 5-10 MG PO CAPS: 1.0000 | ORAL_CAPSULE | Freq: Every day | ORAL | Status: DC

## 2011-03-27 NOTE — Progress Notes (Signed)
Subjective:    Patient ID: Pam Scott, female    DOB: 1946/01/15, 65 y.o.   MRN: [id number]  Poison [place] This is a new problem. The current episode started in the past 7 days. The problem has been gradually worsening since onset. The rash is diffuse. The rash is characterized by itchiness. She was exposed to a spider bite and plant contact. Pertinent negatives include no anorexia, congestion, cough, diarrhea, eye pain, facial edema, fatigue, fever, joint pain, nail changes, rhinorrhea, shortness of breath, sore throat or vomiting. Past treatments include antihistamine and topical steroids. The treatment provided mild relief.  Hypertension This is a chronic problem. The current episode started more than 1 year ago. The problem has been gradually improving since onset. The problem is controlled. Pertinent negatives include no anxiety, blurred vision, chest pain, headaches, malaise/fatigue, neck pain, orthopnea, palpitations, peripheral edema, PND, shortness of breath or sweats. There are no associated agents to hypertension. Past treatments include ACE inhibitors and calcium channel blockers. The current treatment provides significant improvement. There are no compliance problems.  There is no history of chronic renal disease.  Hyperlipidemia This is a chronic problem. The problem is controlled. Recent lipid tests were reviewed and are variable. She has no history of chronic renal disease, diabetes, hypothyroidism, liver disease, obesity or nephrotic syndrome. Factors aggravating her hyperlipidemia include estrogen. Pertinent negatives include no chest pain, myalgias or shortness of breath. Current antihyperlipidemic treatment includes statins. The current treatment provides moderate improvement of lipids. Compliance problems include medication cost.       Review of Systems  Constitutional: Negative for fever, chills, malaise/fatigue, diaphoresis, activity change, appetite change, fatigue and  unexpected weight change.  HENT: Negative for congestion, sore throat, facial swelling, rhinorrhea, trouble swallowing, neck pain and voice change.   Eyes: Negative for blurred vision and pain.  Respiratory: Negative for apnea, cough, choking, chest tightness, shortness of breath, wheezing and stridor.   Cardiovascular: Negative for chest pain, palpitations, orthopnea, leg swelling and PND.  Gastrointestinal: Negative for nausea, vomiting, abdominal pain, diarrhea, constipation, blood in stool, abdominal distention and anorexia.  Genitourinary: Negative for dysuria, urgency, frequency, hematuria, flank pain, decreased urine volume, vaginal bleeding, vaginal discharge, enuresis, difficulty urinating, genital sores, vaginal pain, menstrual problem, pelvic pain and dyspareunia.  Musculoskeletal: Negative for myalgias, back pain, joint pain, joint swelling, arthralgias and gait problem.  Skin: Positive for rash and wound (tick bite on right hip, husband took the tick out but the area is red and very itchy). Negative for nail changes, color change and pallor.  Neurological: Negative for dizziness, tremors, seizures, syncope, facial asymmetry, speech difficulty, weakness, light-headedness, numbness and headaches.  Hematological: Negative for adenopathy. Does not bruise/bleed easily.  Psychiatric/Behavioral: Negative.        Objective:   Physical Exam  Constitutional: She is oriented to person, place, and time. She appears well-developed and well-nourished. No distress.  HENT:  Head: Normocephalic and atraumatic.  Right Ear: External ear normal.  Left Ear: External ear normal.  Nose: Nose normal.  Mouth/Throat: Oropharynx is clear and moist. No oropharyngeal exudate.  Eyes: Conjunctivae and EOM are normal. Pupils are equal, round, and reactive to light. Right eye exhibits no discharge. Left eye exhibits no discharge. No scleral icterus.  Neck: Normal range of motion. Neck supple. No JVD present. No  tracheal deviation present. No thyromegaly present.  Cardiovascular: Normal rate, regular rhythm, normal heart sounds and intact distal pulses.  Exam reveals no gallop and no friction rub.   No murmur  heard. Pulmonary/Chest: Effort normal and breath sounds normal. No stridor. No respiratory distress. She has no wheezes. She has no rales. She exhibits no tenderness.  Abdominal: Soft. Bowel sounds are normal. She exhibits no distension and no mass. There is no tenderness. There is no rebound and no guarding.  Musculoskeletal: Normal range of motion. She exhibits no edema and no tenderness.  Lymphadenopathy:    She has no cervical adenopathy.  Neurological: She is alert and oriented to person, place, and time. She has normal reflexes. She displays normal reflexes. No cranial nerve deficit. She exhibits normal muscle tone. Coordination normal.  Skin: Skin is warm and dry. Abrasion, lesion and rash noted. No bruising, no burn, no ecchymosis, no laceration, no petechiae and no purpura noted. Rash is papular. Rash is not macular, not nodular, not pustular, not vesicular and not urticarial. She is not diaphoretic. There is erythema. No cyanosis. No pallor. Nails show no clubbing.       Area of tick bite on the right hip shows a central area of excoriation but no FB, there is a faint surrounding area of erythema but no warmth, exudate, ttp, fluctuance, induration.  The exposed areas on her arms show faint papular rash with slight excoriation and scaling, but no pustules/exudate/induration/warmth/streaking.  Psychiatric: She has a normal mood and affect. Her behavior is normal. Judgment and thought content normal.          Assessment & Plan:

## 2011-03-27 NOTE — Assessment & Plan Note (Addendum)
Tdap given, continue fluosinolide cream, gave depo-medrol IM in the office today. There is no evidence of infection or ECM to concern me about tick-borne infection.

## 2011-03-27 NOTE — Patient Instructions (Signed)
Hypertension (High Blood Pressure) As your heart beats, it forces blood through your arteries. This force is your blood pressure. If the pressure is too high, it is called hypertension (HTN) or high blood pressure. HTN is dangerous because you may have it and not know it. High blood pressure may mean that your heart has to work harder to pump blood. Your arteries may be narrow or stiff. The extra work puts you at risk for heart disease, stroke, and other problems.  Blood pressure consists of two numbers, a higher number over a lower, 110/72, for example. It is stated as "110 over 72." The ideal is below 120 for the top number (systolic) and under 80 for the bottom (diastolic). Write down your blood pressure today. You should pay close attention to your blood pressure if you have certain conditions such as:  Heart failure.  Prior heart attack.   Diabetes  Chronic kidney Hypercholesterolemia High Blood Cholesterol Cholesterol is a white, waxy, fat-like protein needed by your body in small amounts. The liver makes all the cholesterol you need. It is carried from the liver by the blood through the blood vessels. Deposits (plaque) may build up on blood vessel walls. This makes the arteries narrower and stiffer. Plaque increases the risk for heart attack and stroke. You cannot feel your cholesterol level even if it is very high. The only way to know is by a blood test to check your lipid (fats) levels. Once you know your cholesterol levels, you should keep a record of the test results. Work with your caregiver to to keep your levels in the desired range. WHAT THE RESULTS MEAN: Total cholesterol is a rough measure of all the cholesterol in your blood.  LDL is the so-called bad cholesterol. This is the type that deposits cholesterol in the walls of the arteries. You want this level to be low.  HDL is the good cholesterol because it cleans the arteries and carries the LDL away. You want this level to be high.   Triglycerides are fat that the body can either burn for energy or store. High levels are closely linked to heart disease.  DESIRED LEVELS: Total cholesterol below 200.  LDL below 100 for people at risk, below 70 for very high risk.  HDL above 50 is good, above 60 is best.  Triglycerides below 150.  HOW TO LOWER YOUR CHOLESTEROL: Diet.  Choose fish or white meat chicken and [place], roasted or baked. Limit fatty cuts of red meat, fried foods, and processed meats, such as sausage and lunch meat.  Eat lots of fresh fruits and vegetables. Choose whole grains, beans, pasta, potatoes and cereals.  Use only small amounts of olive, corn or canola oils. Avoid butter, mayonnaise, shortening or palm kernel oils. Avoid foods with trans-fats.  Use skim/nonfat milk and low-fat/nonfat yogurt and cheeses. Avoid whole milk, cream, ice cream, egg yolks and cheeses. Healthy desserts include angel food cake, gingersnaps, animal crackers, hard candy, popsicles, and low-fat/nonfat frozen yogurt. Avoid pastries, cakes, pies and cookies.  Exercise.  A regular program helps decrease LDL and raises HDL.  Helps with weight control.  Do things that increase your activity level like gardening, walking, or taking the stairs.  Medication.  May be prescribed by your caregiver to help lowering cholesterol and the risk for heart disease.  You may need medicine even if your levels are normal if you have several risk factors.  HOME CARE INSTRUCTIONS Follow your diet and exercise programs as suggested by  your caregiver.  Take medications as directed.  Have blood work done when your caregiver feels it is necessary.  MAKE SURE YOU:  Understand these instructions.  Will watch your condition.  Will get help right away if you are not doing well or get worse.  Document Released: 11/03/2005 Document Re-Released: 10/16/2008  [facility] Patient Information 2011 [place], [place].disease.   Prior stroke.   Multiple risk factors for  heart disease.   To see if you have HTN, your blood pressure should be measured while you are seated with your arm held at the level of the heart. It should be measured at least twice. A one-time elevated blood pressure reading (especially in the Emergency Department) does not mean that you need treatment. There may be conditions in which the blood pressure is different between your right and left arms. It is important to see your caregiver soon for a recheck. Most people have essential hypertension which means that there is not a specific cause. This type of high blood pressure may be lowered by changing lifestyle factors such as:  Stress.  Smoking.   Lack of exercise.   Excessive weight.  Drug/tobacco/alcohol use.   Eating less salt.   Most people do not have symptoms from high blood pressure until it has caused damage to the body. Effective treatment can often prevent, delay or reduce that damage. TREATMENT Treatment for high blood pressure, when a cause has been identified, is directed at the cause. There are a large number of medications to treat HTN. These fall into several categories, and your caregiver will help you select the medicines that are best for you. Medications may have side effects. You should review side effects with your caregiver. If your blood pressure stays high after you have made lifestyle changes or started on medicines,   Your medication(s) may need to be changed.   Other problems may need to be addressed.   Be certain you understand your prescriptions, and know how and when to take your medicine.   Be sure to follow up with your caregiver within the time frame advised (usually within two weeks) to have your blood pressure rechecked and to review your medications.   If you are taking more than one medicine to lower your blood pressure, make sure you know how and at what times they should be taken. Taking two medicines at the same time can result in blood  pressure that is too low.  SEEK IMMEDIATE MEDICAL CARE IF YOU DEVELOP:  A severe headache, blurred or changing vision, or confusion.   Unusual weakness or numbness, or a faint feeling.   Severe chest or abdominal pain, vomiting, or breathing problems.  MAKE SURE YOU:   Understand these instructions.   Will watch your condition.   Will get help right away if you are not doing well or get worse.  Document Released: 11/03/2005 Document Re-Released: 04/23/2010 [facility] Patient Information 2011 [place], [place].

## 2011-03-27 NOTE — Progress Notes (Signed)
Addended by: Rock Nephew on: 03/27/2011 11:55 AM   Modules accepted: Orders

## 2011-03-27 NOTE — Assessment & Plan Note (Signed)
Depo-medrol IM given in the office today and continue fluosinolide cream

## 2011-03-27 NOTE — Assessment & Plan Note (Signed)
She has been out of the statin for 2 weeks so today I will check her FLP and liver enzymes

## 2011-03-27 NOTE — Assessment & Plan Note (Signed)
Her BP is well controlled, I will check her lytes and renal function today 

## 2011-03-27 NOTE — Assessment & Plan Note (Signed)
Labs ordered, other issues are up to date and have been addressed

## 2011-03-27 NOTE — Assessment & Plan Note (Addendum)
I will check her vitamin d level and other labs, she does not want to take a statin but she does want to continue the estrogen

## 2011-03-30 LAB — VITAMIN D 1,25 DIHYDROXY: Vitamin D 1, 25 (OH)2 Total: 50 pg/mL (ref 18–72)

## 2011-05-07 ENCOUNTER — Other Ambulatory Visit: Payer: Self-pay | Admitting: Internal Medicine

## 2011-07-02 ENCOUNTER — Other Ambulatory Visit: Payer: Self-pay | Admitting: Internal Medicine

## 2011-07-04 ENCOUNTER — Ambulatory Visit (INDEPENDENT_AMBULATORY_CARE_PROVIDER_SITE_OTHER): Payer: BC Managed Care – PPO | Admitting: Internal Medicine

## 2011-07-04 ENCOUNTER — Encounter: Payer: Self-pay | Admitting: Internal Medicine

## 2011-07-04 VITALS — BP 118/78 | HR 56 | Temp 98.0°F | Resp 16 | Wt 143.0 lb

## 2011-07-04 DIAGNOSIS — L259 Unspecified contact dermatitis, unspecified cause: Secondary | ICD-10-CM | POA: Insufficient documentation

## 2011-07-04 DIAGNOSIS — I10 Essential (primary) hypertension: Secondary | ICD-10-CM

## 2011-07-04 MED ORDER — METHYLPREDNISOLONE ACETATE 80 MG/ML IJ SUSP
120.0000 mg | Freq: Once | INTRAMUSCULAR | Status: AC
  Administered 2011-07-04: 120 mg via INTRAMUSCULAR

## 2011-07-04 MED ORDER — FLUOCINONIDE 0.05 % EX CREA: TOPICAL_CREAM | CUTANEOUS | Status: DC

## 2011-07-04 NOTE — Progress Notes (Signed)
  Subjective:    Patient ID: Pam Scott, female    DOB: October 25, 1946, 65 y.o.   MRN: [id number]  Rash This is a new problem. The current episode started yesterday. The problem has been gradually worsening since onset. The affected locations include the face, left lower leg, right lower leg, left arm and right arm. The rash is characterized by itchiness and redness. She was exposed to plant contact. Pertinent negatives include no anorexia, congestion, cough, diarrhea, eye pain, facial edema, fatigue, fever, joint pain, nail changes, rhinorrhea, shortness of breath, sore throat or vomiting. Past treatments include topical steroids. The treatment provided mild relief.      Review of Systems  Constitutional: Negative for fever, chills, diaphoresis, activity change, appetite change, fatigue and unexpected weight change.  HENT: Negative for congestion, sore throat, facial swelling, rhinorrhea, neck pain and neck stiffness.   Eyes: Negative for photophobia, pain, discharge, redness, itching and visual disturbance.  Respiratory: Negative for apnea, cough, choking, chest tightness, shortness of breath, wheezing and stridor.   Cardiovascular: Negative for chest pain, palpitations and leg swelling.  Gastrointestinal: Negative for vomiting, abdominal pain, diarrhea, constipation, anal bleeding and anorexia.  Genitourinary: Negative for dysuria, urgency, frequency, hematuria, decreased urine volume, enuresis, difficulty urinating and dyspareunia.  Musculoskeletal: Negative for myalgias, back pain, joint pain, joint swelling, arthralgias and gait problem.  Skin: Positive for rash. Negative for nail changes, color change, pallor and wound.  Neurological: Negative.   Hematological: Negative for adenopathy. Does not bruise/bleed easily.  Psychiatric/Behavioral: Negative.        Objective:   Physical Exam  Vitals reviewed. Constitutional: She is oriented to person, place, and time. She appears  well-developed and well-nourished. No distress.  HENT:  Head: Normocephalic and atraumatic.  Right Ear: External ear normal.  Left Ear: External ear normal.  Nose: Nose normal.  Mouth/Throat: Oropharynx is clear and moist. No oropharyngeal exudate.  Eyes: Conjunctivae and EOM are normal. Pupils are equal, round, and reactive to light. Right eye exhibits no discharge. Left eye exhibits no discharge. No scleral icterus.  Neck: Normal range of motion. Neck supple. No JVD present. No tracheal deviation present. No thyromegaly present.  Cardiovascular: Normal rate, regular rhythm, normal heart sounds and intact distal pulses.  Exam reveals no gallop and no friction rub.   No murmur heard. Pulmonary/Chest: Effort normal and breath sounds normal. No stridor. No respiratory distress. She has no wheezes. She has no rales. She exhibits no tenderness.  Abdominal: Soft. Bowel sounds are normal. She exhibits no distension and no mass. There is no tenderness. There is no rebound and no guarding.  Musculoskeletal: Normal range of motion. She exhibits no edema and no tenderness.  Lymphadenopathy:    She has no cervical adenopathy.  Neurological: She is alert and oriented to person, place, and time. She has normal reflexes. She displays normal reflexes. No cranial nerve deficit. She exhibits normal muscle tone. Coordination normal.  Skin: Skin is warm, dry and intact. Rash noted. No abrasion, no bruising, no burn, no ecchymosis, no laceration, no lesion, no petechiae and no purpura noted. Rash is papular. Rash is not macular, not maculopapular, not nodular, not pustular, not vesicular and not urticarial. She is not diaphoretic. No cyanosis or erythema. No pallor. Nails show no clubbing.     Psychiatric: She has a normal mood and affect. Her behavior is normal. Judgment and thought content normal.          Assessment & Plan:

## 2011-07-04 NOTE — Patient Instructions (Signed)
Poison Ivy (Toxicodendron Dermatitis) Poison ivy is a inflammation of the skin (contact dermatitis) caused by touching the allergens on the leaves of the ivy plant following previous exposure to the plant. The rash usually appears 48 hours after exposure. The rash is usually bumps (papules) or blisters (vesicles) in a linear pattern. Depending on your own sensitivity, the rash may simply cause redness and itching, or it may also progress to blisters which may break open. These must be well cared for to prevent secondary bacterial (germ) infection, followed by scarring. Keep any open areas dry, clean, dressed, and covered with an antibacterial ointment if needed. The eyes may also get puffy. The puffiness is worst in the morning and gets better as the day progresses. This dermatitis usually heals without scarring, within 2 to 3 weeks without treatment. HOME CARE INSTRUCTIONS Thoroughly wash with soap and water as soon as you have been exposed to poison ivy. You have about one half hour to remove the plant resin before it will cause the rash. This washing will destroy the oil or antigen on the skin that is causing, or will cause, the rash. Be sure to wash under your fingernails as any plant resin there will continue to spread the rash. Do not rub skin vigorously when washing affected area. Poison ivy cannot spread if no oil from the plant remains on your body. A rash that has progressed to weeping sores will not spread the rash unless you have not washed thoroughly. It is also important to wash any clothes you have been wearing as these may carry active allergens. The rash will return if you wear the unwashed clothing, even several days later. Avoidance of the plant in the future is the best measure. Poison ivy plant can be recognized by the number of leaves. Generally, poison ivy has three leaves with flowering branches on a single stem. Diphenhydramine may be purchased over the counter and used as needed for  itching. Do not drive with this medication if it makes you drowsy. Ask your caregiver about medication for children. SEEK MEDICAL CARE IF   Open sores develop.   Redness spreads beyond area of rash.   You notice purulent (pus-like) discharge.   You have increased pain.   Other signs of infection develop (such as fever).  Document Released: 10/31/2000 Document Re-Released: 10/22/2009 [employer] Patient Information 2011 [employer]. 

## 2011-07-04 NOTE — Assessment & Plan Note (Signed)
Her BP is well controlled 

## 2011-07-04 NOTE — Assessment & Plan Note (Signed)
The lesions look like poison ivy and since her face is involved I have given her an injection of depo-medrol Im and have asked her to use lidex to the areas affected with the exception of her face where she will use OTC hydrocortisone cream

## 2011-10-29 ENCOUNTER — Other Ambulatory Visit: Payer: Self-pay | Admitting: Internal Medicine

## 2012-01-21 ENCOUNTER — Other Ambulatory Visit: Payer: Self-pay | Admitting: Internal Medicine

## 2012-01-21 DIAGNOSIS — Z1231 Encounter for screening mammogram for malignant neoplasm of breast: Secondary | ICD-10-CM

## 2012-02-03 ENCOUNTER — Ambulatory Visit: Payer: BC Managed Care – PPO

## 2012-02-04 ENCOUNTER — Ambulatory Visit
Admission: RE | Admit: 2012-02-04 | Discharge: 2012-02-04 | Disposition: A | Discharge status: Home / Self Care | Payer: BC Managed Care – PPO | Source: Ambulatory Visit | Attending: Internal Medicine | Admitting: Internal Medicine

## 2012-02-04 DIAGNOSIS — Z1231 Encounter for screening mammogram for malignant neoplasm of breast: Secondary | ICD-10-CM

## 2012-02-05 LAB — HM MAMMOGRAPHY: HM Mammogram: NORMAL

## 2012-02-24 ENCOUNTER — Telehealth: Payer: Self-pay | Admitting: Internal Medicine

## 2012-02-24 NOTE — Telephone Encounter (Signed)
Patient notified

## 2012-02-24 NOTE — Telephone Encounter (Signed)
I don't treat glaucoma, she needs to ask her eye doctor about this

## 2012-02-24 NOTE — Telephone Encounter (Signed)
Pt says pharm/[facility] in [facility] faxed form requesting lumigan eye drop .3%--pt ph# [phone]

## 2012-04-06 ENCOUNTER — Ambulatory Visit: Payer: BC Managed Care – PPO | Admitting: Internal Medicine

## 2012-04-19 ENCOUNTER — Ambulatory Visit (INDEPENDENT_AMBULATORY_CARE_PROVIDER_SITE_OTHER): Payer: BC Managed Care – PPO | Admitting: Internal Medicine

## 2012-04-19 ENCOUNTER — Other Ambulatory Visit (INDEPENDENT_AMBULATORY_CARE_PROVIDER_SITE_OTHER): Payer: BC Managed Care – PPO

## 2012-04-19 ENCOUNTER — Encounter: Payer: BC Managed Care – PPO | Admitting: Internal Medicine

## 2012-04-19 ENCOUNTER — Encounter: Payer: Self-pay | Admitting: Internal Medicine

## 2012-04-19 VITALS — BP 120/82 | HR 63 | Temp 97.3°F | Resp 16 | Wt 150.5 lb

## 2012-04-19 DIAGNOSIS — E785 Hyperlipidemia, unspecified: Secondary | ICD-10-CM

## 2012-04-19 DIAGNOSIS — M810 Age-related osteoporosis without current pathological fracture: Secondary | ICD-10-CM

## 2012-04-19 DIAGNOSIS — Z0000 Encounter for general adult medical examination without abnormal findings: Secondary | ICD-10-CM

## 2012-04-19 DIAGNOSIS — I10 Essential (primary) hypertension: Secondary | ICD-10-CM

## 2012-04-19 LAB — COMPREHENSIVE METABOLIC PANEL
CO2: 26 mEq/L (ref 19–32)
Creatinine, Ser: 0.8 mg/dL (ref 0.4–1.2)
GFR: 74.53 mL/min (ref >60.00)
Glucose, Bld: 84 mg/dL (ref 70–99)
Total Bilirubin: 0.6 mg/dL (ref 0.3–1.2)

## 2012-04-19 LAB — CBC WITH DIFFERENTIAL/PLATELET
Basophils Absolute: 0 10*3/uL (ref 0.0–0.1)
Hemoglobin: 12.4 g/dL (ref 12.0–15.0)
Lymphocytes Relative: 30.2 % (ref 12.0–46.0)
Monocytes Relative: 12.2 % — ABNORMAL HIGH (ref 3.0–12.0)
Platelets: 176 10*3/uL (ref 150.0–400.0)
RDW: 14.2 % (ref 11.5–14.6)

## 2012-04-19 LAB — LIPID PANEL
Cholesterol: 173 mg/dL (ref 0–200)
HDL: 83.2 mg/dL (ref >39.00)
Triglycerides: 63 mg/dL (ref 0.0–149.0)
VLDL: 12.6 mg/dL (ref 0.0–40.0)

## 2012-04-19 LAB — CK: Total CK: 226 U/L — ABNORMAL HIGH (ref 7–177)

## 2012-04-19 NOTE — Assessment & Plan Note (Signed)
I will recheck her Vit D level today and I have asked her to get an updated BMD done

## 2012-04-19 NOTE — Progress Notes (Signed)
Subjective:    Patient ID: Pam Scott, female    DOB: 1946/01/15, 66 y.o.   MRN: [id number]  Hypertension This is a chronic problem. The problem has been gradually improving since onset. The problem is controlled. Pertinent negatives include no anxiety, blurred vision, chest pain, headaches, malaise/fatigue, neck pain, orthopnea, palpitations, peripheral edema, PND, shortness of breath or sweats. Past treatments include calcium channel blockers and ACE inhibitors. The current treatment provides significant improvement. There are no compliance problems.  There is no history of chronic renal disease.  Hyperlipidemia This is a chronic problem. The current episode started more than 1 year ago. The problem is controlled. Recent lipid tests were reviewed and are variable. She has no history of chronic renal disease, diabetes, hypothyroidism, liver disease, obesity or nephrotic syndrome. Factors aggravating her hyperlipidemia include no known factors. Associated symptoms include myalgias. Pertinent negatives include no chest pain, focal sensory loss, focal weakness, leg pain or shortness of breath. Current antihyperlipidemic treatment includes statins. The current treatment provides moderate improvement of lipids. There are no compliance problems.       Review of Systems  Constitutional: Negative for fever, chills, malaise/fatigue, diaphoresis, activity change, appetite change, fatigue and unexpected weight change.  HENT: Negative.  Negative for neck pain.   Eyes: Negative.  Negative for blurred vision.  Respiratory: Negative for apnea, cough, chest tightness, shortness of breath, wheezing and stridor.   Cardiovascular: Negative for chest pain, palpitations, orthopnea, leg swelling and PND.  Gastrointestinal: Negative for nausea, vomiting, abdominal pain, diarrhea, constipation, blood in stool and abdominal distention.  Genitourinary: Negative.   Musculoskeletal: Positive for myalgias. Negative for  back pain, joint swelling, arthralgias and gait problem.  Skin: Negative for color change, pallor, rash and wound.  Neurological: Negative for dizziness, tremors, focal weakness, seizures, syncope, facial asymmetry, speech difficulty, weakness, light-headedness, numbness and headaches.  Hematological: Negative for adenopathy. Does not bruise/bleed easily.  Psychiatric/Behavioral: Negative.        Objective:   Physical Exam  Vitals reviewed. Constitutional: She is oriented to person, place, and time. She appears well-developed and well-nourished. No distress.  HENT:  Head: Normocephalic and atraumatic.  Mouth/Throat: Oropharynx is clear and moist. No oropharyngeal exudate.  Eyes: Conjunctivae are normal. Right eye exhibits no discharge. Left eye exhibits no discharge. No scleral icterus.  Neck: Normal range of motion. Neck supple. No JVD present. No tracheal deviation present. No thyromegaly present.  Cardiovascular: Normal rate, regular rhythm, normal heart sounds and intact distal pulses.  Exam reveals no gallop and no friction rub.   No murmur heard. Pulmonary/Chest: Effort normal and breath sounds normal. No stridor. No respiratory distress. She has no wheezes. She has no rales. Chest wall is not dull to percussion. She exhibits no mass, no tenderness, no bony tenderness, no laceration, no crepitus, no edema, no deformity, no swelling and no retraction. Right breast exhibits no inverted nipple, no mass, no nipple discharge, no skin change and no tenderness. Left breast exhibits no inverted nipple, no mass, no nipple discharge, no skin change and no tenderness. Breasts are symmetrical.  Abdominal: Soft. Bowel sounds are normal. She exhibits no distension and no mass. There is no tenderness. There is no rebound and no guarding.  Musculoskeletal: Normal range of motion. She exhibits no edema and no tenderness.  Lymphadenopathy:    She has no cervical adenopathy.  Neurological: She is oriented  to person, place, and time.  Skin: Skin is warm and dry. No rash noted. She is not diaphoretic. No  erythema. No pallor.  Psychiatric: She has a normal mood and affect. Her behavior is normal. Judgment and thought content normal.      Lab Results  Component Value Date   WBC 5.9 03/27/2011   HGB 13.3 03/27/2011   HCT 38.4 03/27/2011   PLT 202.0 03/27/2011   GLUCOSE 84 03/27/2011   CHOL 230* 03/27/2011   TRIG 57.0 03/27/2011   HDL 89.90 03/27/2011   LDLDIRECT 134.0 03/27/2011   LDLCALC 98 11/30/2009   ALT 24 03/27/2011   AST 21 03/27/2011   NA 141 03/27/2011   K 4.2 03/27/2011   CL 105 03/27/2011   CREATININE 1.1 03/27/2011   BUN 19 03/27/2011   CO2 27 03/27/2011   TSH 1.20 03/27/2011      Assessment & Plan:

## 2012-04-19 NOTE — Assessment & Plan Note (Signed)
She has myalgias so I will check her CPK level and I have asked her to stop simvastatin, I will check her FLP CMP TSH today

## 2012-04-19 NOTE — Assessment & Plan Note (Addendum)

## 2012-04-19 NOTE — Patient Instructions (Signed)
Hypercholesterolemia High Blood Cholesterol Cholesterol is a white, waxy, fat-like protein needed by your body in small amounts. The liver makes all the cholesterol you need. It is carried from the liver by the blood through the blood vessels. Deposits (plaque) may build up on blood vessel walls. This makes the arteries narrower and stiffer. Plaque increases the risk for heart attack and stroke. You cannot feel your cholesterol level even if it is very high. The only way to know is by a blood test to check your lipid (fats) levels. Once you know your cholesterol levels, you should keep a record of the test results. Work with your caregiver to to keep your levels in the desired range. WHAT THE RESULTS MEAN:  Total cholesterol is a rough measure of all the cholesterol in your blood.   LDL is the so-called bad cholesterol. This is the type that deposits cholesterol in the walls of the arteries. You want this level to be low.   HDL is the good cholesterol because it cleans the arteries and carries the LDL away. You want this level to be high.   Triglycerides are fat that the body can either burn for energy or store. High levels are closely linked to heart disease.  DESIRED LEVELS:  Total cholesterol below 200.   LDL below 100 for people at risk, below 70 for very high risk.   HDL above 50 is good, above 60 is best.   Triglycerides below 150.  HOW TO LOWER YOUR CHOLESTEROL:  Diet.   Choose fish or white meat chicken and turkey, roasted or baked. Limit fatty cuts of red meat, fried foods, and processed meats, such as sausage and lunch meat.   Eat lots of fresh fruits and vegetables. Choose whole grains, beans, pasta, potatoes and cereals.   Use only small amounts of olive, corn or canola oils. Avoid butter, mayonnaise, shortening or palm kernel oils. Avoid foods with trans-fats.   Use skim/nonfat milk and low-fat/nonfat yogurt and cheeses. Avoid whole milk, cream, ice cream, egg yolks and  cheeses. Healthy desserts include angel food cake, gingersnaps, animal crackers, hard candy, popsicles, and low-fat/nonfat frozen yogurt. Avoid pastries, cakes, pies and cookies.   Exercise.   A regular program helps decrease LDL and raises HDL.   Helps with weight control.   Do things that increase your activity level like gardening, walking, or taking the stairs.   Medication.   May be prescribed by your caregiver to help lowering cholesterol and the risk for heart disease.   You may need medicine even if your levels are normal if you have several risk factors.  HOME CARE INSTRUCTIONS   Follow your diet and exercise programs as suggested by your caregiver.   Take medications as directed.   Have blood work done when your caregiver feels it is necessary.  MAKE SURE YOU:   Understand these instructions.   Will watch your condition.   Will get help right away if you are not doing well or get worse.  Document Released: 11/03/2005 Document Revised: 10/23/2011 Document Reviewed: 04/21/2007 Exit[employer] Patient Information 2012 [employer].Hypertension As your heart beats, it forces blood through your arteries. This force is your blood pressure. If the pressure is too high, it is called hypertension (HTN) or high blood pressure. HTN is dangerous because you may have it and not know it. High blood pressure may mean that your heart has to work harder to pump blood. Your arteries may be narrow or stiff. The extra work   puts you at risk for heart disease, stroke, and other problems.  Blood pressure consists of two numbers, a higher number over a lower, 110/72, for example. It is stated as "110 over 72." The ideal is below 120 for the top number (systolic) and under 80 for the bottom (diastolic). Write down your blood pressure today. You should pay close attention to your blood pressure if you have certain conditions such as:  Heart failure.   Prior heart attack.   Diabetes   Chronic  kidney disease.   Prior stroke.   Multiple risk factors for heart disease.  To see if you have HTN, your blood pressure should be measured while you are seated with your arm held at the level of the heart. It should be measured at least twice. A one-time elevated blood pressure reading (especially in the Emergency Department) does not mean that you need treatment. There may be conditions in which the blood pressure is different between your right and left arms. It is important to see your caregiver soon for a recheck. Most people have essential hypertension which means that there is not a specific cause. This type of high blood pressure may be lowered by changing lifestyle factors such as:  Stress.   Smoking.   Lack of exercise.   Excessive weight.   Drug/tobacco/alcohol use.   Eating less salt.  Most people do not have symptoms from high blood pressure until it has caused damage to the body. Effective treatment can often prevent, delay or reduce that damage. TREATMENT  When a cause has been identified, treatment for high blood pressure is directed at the cause. There are a large number of medications to treat HTN. These fall into several categories, and your caregiver will help you select the medicines that are best for you. Medications may have side effects. You should review side effects with your caregiver. If your blood pressure stays high after you have made lifestyle changes or started on medicines,   Your medication(s) may need to be changed.   Other problems may need to be addressed.   Be certain you understand your prescriptions, and know how and when to take your medicine.   Be sure to follow up with your caregiver within the time frame advised (usually within two weeks) to have your blood pressure rechecked and to review your medications.   If you are taking more than one medicine to lower your blood pressure, make sure you know how and at what times they should be taken.  Taking two medicines at the same time can result in blood pressure that is too low.  SEEK IMMEDIATE MEDICAL CARE IF:  You develop a severe headache, blurred or changing vision, or confusion.   You have unusual weakness or numbness, or a faint feeling.   You have severe chest or abdominal pain, vomiting, or breathing problems.  MAKE SURE YOU:   Understand these instructions.   Will watch your condition.   Will get help right away if you are not doing well or get worse.  Document Released: 11/03/2005 Document Revised: 10/23/2011 Document Reviewed: 06/23/2008 Exit[employer] Patient Information 2012 [employer]. 

## 2012-04-19 NOTE — Assessment & Plan Note (Signed)
Her BP is well controlled, I will check her lytes and renal function 

## 2012-04-21 LAB — VITAMIN D 1,25 DIHYDROXY
Vitamin D 1, 25 (OH)2 Total: 47 pg/mL (ref 18–72)
Vitamin D2 1, 25 (OH)2: <8 pg/mL

## 2012-04-29 ENCOUNTER — Ambulatory Visit (INDEPENDENT_AMBULATORY_CARE_PROVIDER_SITE_OTHER)
Admission: RE | Admit: 2012-04-29 | Discharge: 2012-04-29 | Disposition: A | Discharge status: Home / Self Care | Payer: BC Managed Care – PPO | Source: Ambulatory Visit | Attending: Internal Medicine | Admitting: Internal Medicine

## 2012-04-29 DIAGNOSIS — M810 Age-related osteoporosis without current pathological fracture: Secondary | ICD-10-CM

## 2012-05-06 ENCOUNTER — Telehealth: Payer: Self-pay | Admitting: *Deleted

## 2012-05-06 NOTE — Telephone Encounter (Signed)
No results yet

## 2012-05-06 NOTE — Telephone Encounter (Signed)
Pt informed

## 2012-05-06 NOTE — Telephone Encounter (Signed)
Pt called requesting results of Bone Density scan done about 1 week ago.

## 2012-05-15 ENCOUNTER — Encounter: Payer: Self-pay | Admitting: Internal Medicine

## 2012-06-07 ENCOUNTER — Other Ambulatory Visit: Payer: Self-pay | Admitting: Internal Medicine

## 2012-06-21 ENCOUNTER — Ambulatory Visit (INDEPENDENT_AMBULATORY_CARE_PROVIDER_SITE_OTHER): Payer: BC Managed Care – PPO | Admitting: Internal Medicine

## 2012-06-21 ENCOUNTER — Encounter: Payer: Self-pay | Admitting: Internal Medicine

## 2012-06-21 VITALS — BP 138/88 | HR 60 | Temp 98.0°F | Resp 16 | Wt 151.0 lb

## 2012-06-21 DIAGNOSIS — E785 Hyperlipidemia, unspecified: Secondary | ICD-10-CM

## 2012-06-21 DIAGNOSIS — I10 Essential (primary) hypertension: Secondary | ICD-10-CM

## 2012-06-21 MED ORDER — OLMESARTAN MEDOXOMIL-HCTZ 20-12.5 MG PO TABS: 1.0000 | ORAL_TABLET | Freq: Every day | ORAL | Status: DC

## 2012-06-21 NOTE — Patient Instructions (Signed)

## 2012-06-21 NOTE — Progress Notes (Signed)
  Subjective:    Patient ID: Pam Scott, female    DOB: 1945-12-05, 66 y.o.   MRN: [id number]  Hypertension This is a chronic problem. The current episode started more than 1 year ago. The problem has been gradually improving since onset. Associated symptoms include peripheral edema. Pertinent negatives include no anxiety, blurred vision, chest pain, headaches, malaise/fatigue, neck pain, orthopnea, palpitations, PND, shortness of breath or sweats. Past treatments include ACE inhibitors and calcium channel blockers. The current treatment provides moderate improvement. Compliance problems include exercise and diet.       Review of Systems  Constitutional: Negative for fever, chills, malaise/fatigue, diaphoresis, activity change, appetite change, fatigue and unexpected weight change.  HENT: Negative.  Negative for neck pain.   Eyes: Negative.  Negative for blurred vision.  Respiratory: Negative for cough, chest tightness, shortness of breath, wheezing and stridor.   Cardiovascular: Positive for leg swelling. Negative for chest pain, palpitations, orthopnea and PND.  Gastrointestinal: Negative.   Genitourinary: Negative.   Musculoskeletal: Negative for myalgias, back pain, joint swelling, arthralgias and gait problem.  Skin: Negative.   Neurological: Negative for dizziness, tremors, seizures, syncope, facial asymmetry, speech difficulty, weakness, light-headedness, numbness and headaches.  Hematological: Negative for adenopathy. Does not bruise/bleed easily.  Psychiatric/Behavioral: Negative.        Objective:   Physical Exam  Vitals reviewed. Constitutional: She is oriented to person, place, and time. She appears well-developed and well-nourished. No distress.  HENT:  Head: Normocephalic and atraumatic.  Mouth/Throat: Oropharynx is clear and moist. No oropharyngeal exudate.  Eyes: Conjunctivae are normal. Right eye exhibits no discharge. Left eye exhibits no discharge. No scleral  icterus.  Neck: Normal range of motion. Neck supple. No JVD present. No tracheal deviation present. No thyromegaly present.  Cardiovascular: Normal rate, regular rhythm, normal heart sounds and intact distal pulses.  Exam reveals no gallop and no friction rub.   No murmur heard. Pulmonary/Chest: Effort normal and breath sounds normal. No stridor. No respiratory distress. She has no wheezes. She has no rales. She exhibits no tenderness.  Abdominal: Soft. Bowel sounds are normal. She exhibits no distension and no mass. There is no tenderness. There is no rebound and no guarding.  Musculoskeletal: Normal range of motion. She exhibits edema (1+ edema around both ankles). She exhibits no tenderness.  Lymphadenopathy:    She has no cervical adenopathy.  Neurological: She is oriented to person, place, and time.  Skin: Skin is warm and dry. No rash noted. She is not diaphoretic. No erythema. No pallor.  Psychiatric: She has a normal mood and affect. Her behavior is normal. Judgment and thought content normal.      Lab Results  Component Value Date   WBC 6.0 04/19/2012   HGB 12.4 04/19/2012   HCT 37.6 04/19/2012   PLT 176.0 04/19/2012   GLUCOSE 84 04/19/2012   CHOL 173 04/19/2012   TRIG 63.0 04/19/2012   HDL 83.20 04/19/2012   LDLDIRECT 134.0 03/27/2011   LDLCALC 77 04/19/2012   ALT 27 04/19/2012   AST 23 04/19/2012   NA 139 04/19/2012   K 4.5 04/19/2012   CL 106 04/19/2012   CREATININE 0.8 04/19/2012   BUN 19 04/19/2012   CO2 26 04/19/2012   TSH 3.19 04/19/2012      Assessment & Plan:

## 2012-06-21 NOTE — Assessment & Plan Note (Signed)
I think the CCB therapy has caused her edema so I have asked her to stop her current meds for HTN and start Benicar-HCT

## 2012-06-21 NOTE — Assessment & Plan Note (Signed)
Her muscle aches have resolved, she wishes to stay off of statins for now

## 2012-07-09 ENCOUNTER — Telehealth: Payer: Self-pay | Admitting: Internal Medicine

## 2012-07-09 MED ORDER — FLUCONAZOLE 150 MG PO TABS: 150.0000 mg | ORAL_TABLET | Freq: Once | ORAL | Status: AC

## 2012-07-09 NOTE — Telephone Encounter (Signed)
Stop benicar and let me know how she feels next week  Rx for diflucan sent to her pharmacy

## 2012-07-09 NOTE — Telephone Encounter (Signed)
Pt advised of MD recommendation and Rx/pharmacy. Agrees and will callback regarding sxs.

## 2012-07-09 NOTE — Telephone Encounter (Signed)
Caller: [name]/Patient; Patient Name: Pam Scott; PCP: Sanda Linger; Best Callback Phone Number: [phone]; Reason for call: Side effects of the medication Benicar? Onset approx 07/05/2012 pt began experiencing a burning sensation in her feet and legs.  Legs and feet don't look swollen but feel swollen. Pt has new onset of arthritic pain in both hands. See Provider in 24 hrs disposition due to 'Persistent or recurrent symptoms'.   Pt concerned she may have a vaginal yeast infection. Onset of symptoms 07/07/12.  See Provider within 24 hrs disposition due to ' Gential itching, burning or redness'.  Home care advice given.   Pt was evaluated 06/21/12 Pt hestitant about making an appt and she wants Dr. Yetta Barre to review the  information.  She will make an appt if the what  Dr. Yetta Barre prefers.   PLEASE FOLLOW UP WITH PT IN REGARDS TO THE SIDE EFFECTS SHE IS EXPERIENCING FROM BENICAR. Thank you.

## 2012-07-28 ENCOUNTER — Other Ambulatory Visit (INDEPENDENT_AMBULATORY_CARE_PROVIDER_SITE_OTHER): Payer: BC Managed Care – PPO

## 2012-07-28 ENCOUNTER — Encounter: Payer: Self-pay | Admitting: Internal Medicine

## 2012-07-28 ENCOUNTER — Ambulatory Visit (INDEPENDENT_AMBULATORY_CARE_PROVIDER_SITE_OTHER): Payer: BC Managed Care – PPO | Admitting: Internal Medicine

## 2012-07-28 VITALS — BP 122/88 | HR 52 | Temp 98.0°F | Resp 16 | Wt 153.0 lb

## 2012-07-28 DIAGNOSIS — M797 Fibromyalgia: Secondary | ICD-10-CM | POA: Insufficient documentation

## 2012-07-28 DIAGNOSIS — I10 Essential (primary) hypertension: Secondary | ICD-10-CM

## 2012-07-28 DIAGNOSIS — IMO0001 Reserved for inherently not codable concepts without codable children: Secondary | ICD-10-CM

## 2012-07-28 LAB — SEDIMENTATION RATE: Sed Rate: 10 mm/hr (ref 0–22)

## 2012-07-28 MED ORDER — HYDROCHLOROTHIAZIDE 12.5 MG PO CAPS: 12.5000 mg | ORAL_CAPSULE | Freq: Every day | ORAL | Status: DC

## 2012-07-28 MED ORDER — PREGABALIN 75 MG PO CAPS: 75.0000 mg | ORAL_CAPSULE | Freq: Two times a day (BID) | ORAL | Status: DC

## 2012-07-28 NOTE — Patient Instructions (Signed)

## 2012-07-28 NOTE — Progress Notes (Signed)
  Subjective:    Patient ID: Pam Scott, female    DOB: Nov 20, 1945, 66 y.o.   MRN: [id number]  HPI  She returns and complains of persistent aching in all of her muscle and joints, She felt better for a few days after she stopped the statin but then the pain returned. She now tells me that this pain dates back over several years and she has been told previously that she has "FMG."  Review of Systems  Constitutional: Negative for fever, chills, diaphoresis, activity change, appetite change, fatigue and unexpected weight change.  HENT: Negative.   Eyes: Negative.   Respiratory: Negative for cough, chest tightness, shortness of breath, wheezing and stridor.   Cardiovascular: Positive for leg swelling (mild ankle edema). Negative for chest pain and palpitations.  Gastrointestinal: Negative.   Genitourinary: Negative.   Musculoskeletal: Positive for myalgias and arthralgias. Negative for back pain, joint swelling and gait problem.  Skin: Negative.   Neurological: Negative.   Hematological: Negative.   Psychiatric/Behavioral: Positive for disturbed wake/sleep cycle. Negative for suicidal ideas, hallucinations, behavioral problems, confusion, self-injury, dysphoric mood, decreased concentration and agitation. The patient is not nervous/anxious and is not hyperactive.        Objective:   Physical Exam  Vitals reviewed. Constitutional: She is oriented to person, place, and time. She appears well-developed and well-nourished. No distress.  HENT:  Head: Normocephalic and atraumatic.  Mouth/Throat: Oropharynx is clear and moist. No oropharyngeal exudate.  Eyes: Conjunctivae normal are normal. Right eye exhibits no discharge. Left eye exhibits no discharge. No scleral icterus.  Neck: Normal range of motion. Neck supple. No JVD present. No tracheal deviation present. No thyromegaly present.  Cardiovascular: Normal rate, regular rhythm, normal heart sounds and intact distal pulses.  Exam reveals  no gallop and no friction rub.   No murmur heard. Pulmonary/Chest: Effort normal and breath sounds normal. No stridor. No respiratory distress. She has no wheezes. She has no rales. She exhibits no tenderness.  Abdominal: Soft. Bowel sounds are normal. She exhibits no distension and no mass. There is no tenderness. There is no rebound and no guarding.  Musculoskeletal: Normal range of motion. She exhibits edema (trace bilateral ankle edema). She exhibits no tenderness.  Lymphadenopathy:    She has no cervical adenopathy.  Neurological: She is oriented to person, place, and time.  Skin: Skin is warm and dry. No rash noted. She is not diaphoretic. No erythema. No pallor.  Psychiatric: She has a normal mood and affect. Her behavior is normal. Judgment and thought content normal.     Lab Results  Component Value Date   WBC 6.0 04/19/2012   HGB 12.4 04/19/2012   HCT 37.6 04/19/2012   PLT 176.0 04/19/2012   GLUCOSE 84 04/19/2012   CHOL 173 04/19/2012   TRIG 63.0 04/19/2012   HDL 83.20 04/19/2012   LDLDIRECT 134.0 03/27/2011   LDLCALC 77 04/19/2012   ALT 27 04/19/2012   AST 23 04/19/2012   NA 139 04/19/2012   K 4.5 04/19/2012   CL 106 04/19/2012   CREATININE 0.8 04/19/2012   BUN 19 04/19/2012   CO2 26 04/19/2012   TSH 3.19 04/19/2012       Assessment & Plan:

## 2012-07-29 ENCOUNTER — Encounter: Payer: Self-pay | Admitting: Internal Medicine

## 2012-07-29 LAB — COMPREHENSIVE METABOLIC PANEL
Alkaline Phosphatase: 52 U/L (ref 39–117)
Creatinine, Ser: 1 mg/dL (ref 0.4–1.2)
Glucose, Bld: 98 mg/dL (ref 70–99)
Sodium: 140 mEq/L (ref 135–145)
Total Bilirubin: 0.9 mg/dL (ref 0.3–1.2)
Total Protein: 7.3 g/dL (ref 6.0–8.3)

## 2012-07-29 LAB — ANA: Anti Nuclear Antibody(ANA): NEGATIVE

## 2012-07-29 LAB — C-REACTIVE PROTEIN: CRP: <0.5 mg/dL (ref 0.5–20.0)

## 2012-07-29 LAB — CK: Total CK: 216 U/L — ABNORMAL HIGH (ref 7–177)

## 2012-07-29 NOTE — Assessment & Plan Note (Signed)
I will check her labs today to look for myositis, CT disease, etc. She will try lyrica for symptom relief.

## 2012-07-29 NOTE — Assessment & Plan Note (Signed)
She did not tolerate benicar very well so I have asked her to try HCTZ as her only BP agent

## 2012-08-20 ENCOUNTER — Encounter: Payer: Self-pay | Admitting: Internal Medicine

## 2012-08-20 ENCOUNTER — Ambulatory Visit (INDEPENDENT_AMBULATORY_CARE_PROVIDER_SITE_OTHER): Payer: BC Managed Care – PPO | Admitting: Internal Medicine

## 2012-08-20 VITALS — BP 128/88 | HR 59 | Temp 98.1°F | Resp 16 | Wt 149.2 lb

## 2012-08-20 DIAGNOSIS — Z23 Encounter for immunization: Secondary | ICD-10-CM

## 2012-08-20 DIAGNOSIS — I10 Essential (primary) hypertension: Secondary | ICD-10-CM

## 2012-08-20 DIAGNOSIS — IMO0001 Reserved for inherently not codable concepts without codable children: Secondary | ICD-10-CM

## 2012-08-20 MED ORDER — PREGABALIN 75 MG PO CAPS: 75.0000 mg | ORAL_CAPSULE | Freq: Two times a day (BID) | ORAL | Status: DC

## 2012-08-20 NOTE — Patient Instructions (Signed)

## 2012-08-22 NOTE — Assessment & Plan Note (Signed)
She has had a great response to lyrica

## 2012-08-22 NOTE — Assessment & Plan Note (Signed)
Her BP is well controlled 

## 2012-08-22 NOTE — Progress Notes (Signed)
  Subjective:    Patient ID: Pam Scott, female    DOB: 06-12-1946, 66 y.o.   MRN: [id number]  Hypertension This is a chronic problem. The current episode started more than 1 year ago. The problem has been gradually improving since onset. The problem is controlled. Pertinent negatives include no anxiety, blurred vision, chest pain, headaches, malaise/fatigue, neck pain, orthopnea, palpitations, peripheral edema, PND, shortness of breath or sweats. Agents associated with hypertension include estrogens. Past treatments include diuretics. The current treatment provides significant improvement. There are no compliance problems.       Review of Systems  Constitutional: Negative for fever, chills, malaise/fatigue, diaphoresis, activity change, appetite change, fatigue and unexpected weight change.  HENT: Negative.  Negative for neck pain.   Eyes: Negative.  Negative for blurred vision.  Respiratory: Negative for cough, chest tightness, shortness of breath, wheezing and stridor.   Cardiovascular: Negative for chest pain, palpitations, orthopnea, leg swelling and PND.  Gastrointestinal: Negative.   Genitourinary: Negative.   Musculoskeletal: Negative for myalgias, back pain, joint swelling, arthralgias and gait problem.  Skin: Negative.   Neurological: Negative.  Negative for headaches.  Hematological: Negative for adenopathy. Does not bruise/bleed easily.  Psychiatric/Behavioral: Negative.        Objective:   Physical Exam  Vitals reviewed. Constitutional: She is oriented to person, place, and time. She appears well-developed and well-nourished. No distress.  HENT:  Head: Normocephalic and atraumatic.  Mouth/Throat: Oropharynx is clear and moist. No oropharyngeal exudate.  Eyes: Conjunctivae normal are normal. Right eye exhibits no discharge. Left eye exhibits no discharge. No scleral icterus.  Neck: Normal range of motion. Neck supple. No JVD present. No tracheal deviation present. No  thyromegaly present.  Cardiovascular: Normal rate, regular rhythm, normal heart sounds and intact distal pulses.  Exam reveals no gallop and no friction rub.   No murmur heard. Pulmonary/Chest: Effort normal and breath sounds normal. No stridor. No respiratory distress. She has no wheezes. She has no rales. She exhibits no tenderness.  Abdominal: Soft. Bowel sounds are normal. She exhibits no distension and no mass. There is no tenderness. There is no rebound and no guarding.  Musculoskeletal: Normal range of motion. She exhibits no edema and no tenderness.  Lymphadenopathy:    She has no cervical adenopathy.  Neurological: She is oriented to person, place, and time.  Skin: Skin is warm and dry. No rash noted. She is not diaphoretic. No erythema. No pallor.  Psychiatric: She has a normal mood and affect. Her behavior is normal. Judgment and thought content normal.          Assessment & Plan:

## 2012-08-23 ENCOUNTER — Telehealth: Payer: Self-pay | Admitting: Internal Medicine

## 2012-08-23 DIAGNOSIS — IMO0001 Reserved for inherently not codable concepts without codable children: Secondary | ICD-10-CM

## 2012-08-23 MED ORDER — PREGABALIN 75 MG PO CAPS: 75.0000 mg | ORAL_CAPSULE | Freq: Two times a day (BID) | ORAL | Status: DC

## 2012-08-23 NOTE — Telephone Encounter (Signed)
Patient needs to her Lyrica sent in to [facility] in [place] because her mail order RX was too expensive

## 2012-09-21 ENCOUNTER — Other Ambulatory Visit: Payer: Self-pay | Admitting: Internal Medicine

## 2012-12-17 ENCOUNTER — Telehealth: Payer: Self-pay | Admitting: Internal Medicine

## 2012-12-17 NOTE — Telephone Encounter (Signed)
Patient Information:  Caller Name: Darel Hong  Phone: [phone]  Patient: Pam Scott, Pam Scott  Gender: Female  DOB: Aug 05, 1946  Age: 67 Years  PCP: Sanda Linger (Adults only)  Office Follow Up:  Does the office need to follow up with this patient?: No  Instructions For The Office: N/A  RN Note:  Advised caller we could not call in antibiotics without evaluation. Patient cannot provide any symptoms other than body aches. offered an appt and declined.  Symptoms  Reason For Call & Symptoms: Patient states illness on Monday with chills, body aches, decreased appetite, +headache. .  Can the physican send me a Zpack?   Patient cannot provide any symptoms other than body aches.  No cough, no runny nose, no congestion, no n/v/d, no cold symptoms.  Reviewed Health History In EMR: Yes  Reviewed Medications In EMR: Yes  Reviewed Allergies In EMR: Yes  Reviewed Surgeries / Procedures: No  Date of Onset of Symptoms: 12/13/2012  Treatments Tried: Thera flu, ibuprofen, alka seltzer  Treatments Tried Worked: No  Guideline(s) Used:  Influenza - Seasonal  Disposition Per Guideline:   Home Care  Reason For Disposition Reached:   Probable influenza with no complications and not HIGH RISK  Advice Given:  Reassurance  For most healthy adults, influenza feels like a bad cold. The dangers of influenza for normal, healthy people (under 17 years of age) are overrated.  The treatment of influenza depends on your main symptoms. Generally, treatment is the same as for other viral respiratory infections (colds). Bed rest is unnecessary.  Here is some care advice that should help.  Treating the Symptoms of Flu  Fever, Muscle Aches, and Headache: For fever more than 101 F (38.3 C), muscle aches, and headaches, take acetaminophen every 4-6 hours (Adults 650 mg) OR ibuprofen every 6-8 hours (Adults 400-600 mg).  Sore Throat: Use throat lozenges, hard candy or warm chicken broth.  Cough: Use cough drops.  Hydrate: Drink extra liquids. If the air in your home is dry, use a humidifier.  Call Back If:  Fever lasts more than 3 days  Runny nose lasts more than 10 days  Cough lasts more than 3 weeks  You become short of breath or worse.

## 2013-01-19 ENCOUNTER — Other Ambulatory Visit: Payer: Self-pay | Admitting: Internal Medicine

## 2013-02-09 ENCOUNTER — Other Ambulatory Visit: Payer: Self-pay

## 2013-02-09 DIAGNOSIS — Z1231 Encounter for screening mammogram for malignant neoplasm of breast: Secondary | ICD-10-CM

## 2013-03-16 ENCOUNTER — Ambulatory Visit: Payer: BC Managed Care – PPO

## 2013-03-24 ENCOUNTER — Ambulatory Visit
Admission: RE | Admit: 2013-03-24 | Discharge: 2013-03-24 | Disposition: A | Discharge status: Home / Self Care | Payer: BC Managed Care – PPO | Source: Ambulatory Visit

## 2013-03-24 DIAGNOSIS — Z1231 Encounter for screening mammogram for malignant neoplasm of breast: Secondary | ICD-10-CM

## 2013-03-25 ENCOUNTER — Telehealth: Payer: Self-pay | Admitting: *Deleted

## 2013-03-25 ENCOUNTER — Telehealth: Payer: Self-pay

## 2013-03-25 MED ORDER — DEXAMETHASONE 1.5 MG PO KIT: 1.0000 | PACK | Freq: Every day | ORAL | Status: DC

## 2013-03-25 MED ORDER — FLUOCINONIDE-E 0.05 % EX CREA: TOPICAL_CREAM | Freq: Two times a day (BID) | CUTANEOUS | Status: DC

## 2013-03-25 MED ORDER — METHYLPREDNISOLONE 4 MG PO KIT: PACK | ORAL | Status: DC

## 2013-03-25 NOTE — Telephone Encounter (Signed)
Pharmacy called stating that Dexamethasone pak is no longer available in 1.5 mg strength. Pharmacy is requesting MD e-script an alternative dose ASAP because pt will be leaving to go out of town tomorrow.

## 2013-03-25 NOTE — Telephone Encounter (Signed)
Pt informed rx's sent to [facility].

## 2013-03-25 NOTE — Telephone Encounter (Signed)
Pt has poison ivy but is going out of town this weekend. She is requesting MD sent in a rx for her-please advise.

## 2013-03-25 NOTE — Telephone Encounter (Signed)
done

## 2013-04-05 ENCOUNTER — Telehealth: Payer: Self-pay

## 2013-04-05 DIAGNOSIS — IMO0001 Reserved for inherently not codable concepts without codable children: Secondary | ICD-10-CM

## 2013-04-05 NOTE — Telephone Encounter (Signed)
Received [facility] request from [facility] for lyrica 75mg , please advise if ok to refill Thanks

## 2013-04-05 NOTE — Telephone Encounter (Signed)
yes

## 2013-04-06 MED ORDER — PREGABALIN 75 MG PO CAPS: 75.0000 mg | ORAL_CAPSULE | Freq: Two times a day (BID) | ORAL | Status: DC

## 2013-04-06 NOTE — Telephone Encounter (Signed)
RX faxed to mail order [facility]

## 2013-04-13 ENCOUNTER — Ambulatory Visit (INDEPENDENT_AMBULATORY_CARE_PROVIDER_SITE_OTHER): Payer: BC Managed Care – PPO | Admitting: Internal Medicine

## 2013-04-13 ENCOUNTER — Encounter: Payer: Self-pay | Admitting: Internal Medicine

## 2013-04-13 ENCOUNTER — Ambulatory Visit (INDEPENDENT_AMBULATORY_CARE_PROVIDER_SITE_OTHER)
Admission: RE | Admit: 2013-04-13 | Discharge: 2013-04-13 | Disposition: A | Discharge status: Home / Self Care | Payer: BC Managed Care – PPO | Source: Ambulatory Visit | Attending: Internal Medicine | Admitting: Internal Medicine

## 2013-04-13 VITALS — BP 140/78 | HR 69 | Temp 98.3°F | Resp 16 | Wt 147.0 lb

## 2013-04-13 DIAGNOSIS — M25519 Pain in unspecified shoulder: Secondary | ICD-10-CM

## 2013-04-13 DIAGNOSIS — M25511 Pain in right shoulder: Secondary | ICD-10-CM

## 2013-04-13 DIAGNOSIS — M67911 Unspecified disorder of synovium and tendon, right shoulder: Secondary | ICD-10-CM

## 2013-04-13 DIAGNOSIS — M719 Bursopathy, unspecified: Secondary | ICD-10-CM

## 2013-04-13 DIAGNOSIS — M67919 Unspecified disorder of synovium and tendon, unspecified shoulder: Secondary | ICD-10-CM

## 2013-04-13 MED ORDER — HYDROCODONE-ACETAMINOPHEN 5-325 MG PO TABS: 1.0000 | ORAL_TABLET | Freq: Four times a day (QID) | ORAL | Status: DC | PRN

## 2013-04-13 NOTE — Patient Instructions (Signed)
Shoulder Pain  The shoulder is the joint that connects your arms to your body. The bones that form the shoulder joint include the upper arm bone (humerus), the shoulder blade (scapula), and the collarbone (clavicle). The top of the humerus is shaped like a ball and fits into a rather flat socket on the scapula (glenoid cavity). A combination of muscles and strong, fibrous tissues that connect muscles to bones (tendons) support your shoulder joint and hold the ball in the socket. Small, fluid-filled sacs (bursae) are located in different areas of the joint. They act as cushions between the bones and the overlying soft tissues and help reduce friction between the gliding tendons and the bone as you move your arm. Your shoulder joint allows a wide range of motion in your arm. This range of motion allows you to do things like scratch your back or throw a ball. However, this range of motion also makes your shoulder more prone to pain from overuse and injury.  Causes of shoulder pain can originate from both injury and overuse and usually can be grouped in the following four categories:   Redness, swelling, and pain (inflammation) of the tendon (tendinitis) or the bursae (bursitis).   Instability, such as a dislocation of the joint.   Inflammation of the joint (arthritis).   Broken bone (fracture).  HOME CARE INSTRUCTIONS    Apply ice to the sore area.   Put ice in a plastic bag.   Place a towel between your skin and the bag.   Leave the ice on for 15-20 minutes, 3-4 times per day for the first 2 days.   Stop using cold packs if they do not help with the pain.   If you have a shoulder sling or immobilizer, wear it as long as your caregiver instructs. Only remove it to shower or bathe. Move your arm as little as possible, but keep your hand moving to prevent swelling.   Squeeze a soft ball or foam pad as much as possible to help prevent swelling.   Only take over-the-counter or prescription medicines for pain,  discomfort, or fever as directed by your caregiver.  SEEK MEDICAL CARE IF:    Your shoulder pain increases, or new pain develops in your arm, hand, or fingers.   Your hand or fingers become cold and numb.   Your pain is not relieved with medicines.  SEEK IMMEDIATE MEDICAL CARE IF:    Your arm, hand, or fingers are numb or tingling.   Your arm, hand, or fingers are significantly swollen or turn white or blue.  MAKE SURE YOU:    Understand these instructions.   Will watch your condition.   Will get help right away if you are not doing well or get worse.  Document Released: 08/13/2005 Document Revised: 07/28/2012 Document Reviewed: 10/18/2011  [employer] Patient Information 2014 [employer].

## 2013-04-14 ENCOUNTER — Ambulatory Visit: Payer: BC Managed Care – PPO | Admitting: Internal Medicine

## 2013-04-14 ENCOUNTER — Encounter: Payer: Self-pay | Admitting: Internal Medicine

## 2013-04-14 DIAGNOSIS — S43429A Sprain of unspecified rotator cuff capsule, initial encounter: Secondary | ICD-10-CM | POA: Insufficient documentation

## 2013-04-14 DIAGNOSIS — M67919 Unspecified disorder of synovium and tendon, unspecified shoulder: Secondary | ICD-10-CM | POA: Insufficient documentation

## 2013-04-14 NOTE — Assessment & Plan Note (Signed)
Ortho referral I question the need for an MRI

## 2013-04-14 NOTE — Assessment & Plan Note (Signed)
Plain xray is normal I think she has a rotator cuff tendonitis Will refer to ortho

## 2013-04-14 NOTE — Progress Notes (Signed)
  Subjective:    Patient ID: Pam Scott, female    DOB: Sep 04, 1946, 67 y.o.   MRN: [id number]  Shoulder Pain  The pain is present in the right shoulder. This is a recurrent problem. The current episode started more than 1 month ago. There has been a history of trauma. The problem occurs intermittently. The problem has been gradually worsening. The pain is at a severity of 4/10. Associated symptoms include a limited range of motion. Pertinent negatives include no fever, inability to bear weight, itching, joint locking, joint swelling, numbness, stiffness or tingling. The symptoms are aggravated by activity. She has tried NSAIDS for the symptoms. The treatment provided mild relief.      Review of Systems  Constitutional: Negative.  Negative for fever.  HENT: Negative.   Eyes: Negative.   Respiratory: Negative.   Cardiovascular: Negative.   Gastrointestinal: Negative.   Endocrine: Negative.   Genitourinary: Negative.   Musculoskeletal: Positive for arthralgias. Negative for stiffness.  Skin: Negative.  Negative for itching.  Allergic/Immunologic: Negative.   Neurological: Negative.  Negative for tingling and numbness.  Hematological: Negative.   Psychiatric/Behavioral: Negative.        Objective:   Physical Exam  Constitutional: She is oriented to person, place, and time. She appears well-developed and well-nourished. No distress.  HENT:  Head: Normocephalic and atraumatic.  Mouth/Throat: Oropharynx is clear and moist. No oropharyngeal exudate.  Eyes: Conjunctivae are normal. Right eye exhibits no discharge. Left eye exhibits no discharge. No scleral icterus.  Neck: Normal range of motion. Neck supple. No JVD present. No tracheal deviation present. No thyromegaly present.  Cardiovascular: Normal rate, regular rhythm, normal heart sounds and intact distal pulses.  Exam reveals no gallop and no friction rub.   No murmur heard. Pulmonary/Chest: Effort normal and breath sounds  normal. No stridor. No respiratory distress. She has no wheezes. She has no rales. She exhibits no tenderness.  Abdominal: Soft. Bowel sounds are normal. She exhibits no distension and no mass. There is no tenderness. There is no rebound and no guarding.  Musculoskeletal: She exhibits no edema and no tenderness.       Right shoulder: She exhibits decreased range of motion and tenderness (posteriorly). She exhibits no bony tenderness, no swelling, no effusion, no crepitus, no deformity, no laceration, no pain, no spasm, normal pulse and normal strength.  Lymphadenopathy:    She has no cervical adenopathy.  Neurological: She is oriented to person, place, and time.  Skin: Skin is warm and dry. No rash noted. She is not diaphoretic. No erythema. No pallor.  Psychiatric: She has a normal mood and affect. Her behavior is normal. Judgment and thought content normal.     Lab Results  Component Value Date   WBC 6.0 04/19/2012   HGB 12.4 04/19/2012   HCT 37.6 04/19/2012   PLT 176.0 04/19/2012   GLUCOSE 98 07/28/2012   CHOL 173 04/19/2012   TRIG 63.0 04/19/2012   HDL 83.20 04/19/2012   LDLDIRECT 134.0 03/27/2011   LDLCALC 77 04/19/2012   ALT 22 07/28/2012   AST 25 07/28/2012   NA 140 07/28/2012   K 4.2 07/28/2012   CL 106 07/28/2012   CREATININE 1.0 07/28/2012   BUN 17 07/28/2012   CO2 24 07/28/2012   TSH 3.19 04/19/2012       Assessment & Plan:

## 2013-04-20 ENCOUNTER — Encounter: Payer: BC Managed Care – PPO | Admitting: Internal Medicine

## 2013-05-22 ENCOUNTER — Other Ambulatory Visit: Payer: Self-pay | Admitting: Internal Medicine

## 2013-06-23 ENCOUNTER — Encounter: Payer: Self-pay | Admitting: Internal Medicine

## 2013-06-23 ENCOUNTER — Other Ambulatory Visit (INDEPENDENT_AMBULATORY_CARE_PROVIDER_SITE_OTHER): Payer: BC Managed Care – PPO

## 2013-06-23 ENCOUNTER — Ambulatory Visit (INDEPENDENT_AMBULATORY_CARE_PROVIDER_SITE_OTHER): Payer: BC Managed Care – PPO | Admitting: Internal Medicine

## 2013-06-23 VITALS — BP 124/82 | HR 59 | Temp 98.0°F | Resp 16 | Ht 64.0 in | Wt 143.5 lb

## 2013-06-23 DIAGNOSIS — E785 Hyperlipidemia, unspecified: Secondary | ICD-10-CM

## 2013-06-23 DIAGNOSIS — IMO0001 Reserved for inherently not codable concepts without codable children: Secondary | ICD-10-CM

## 2013-06-23 DIAGNOSIS — M25519 Pain in unspecified shoulder: Secondary | ICD-10-CM

## 2013-06-23 DIAGNOSIS — I10 Essential (primary) hypertension: Secondary | ICD-10-CM

## 2013-06-23 DIAGNOSIS — I498 Other specified cardiac arrhythmias: Secondary | ICD-10-CM

## 2013-06-23 DIAGNOSIS — Z23 Encounter for immunization: Secondary | ICD-10-CM

## 2013-06-23 DIAGNOSIS — R001 Bradycardia, unspecified: Secondary | ICD-10-CM | POA: Insufficient documentation

## 2013-06-23 DIAGNOSIS — Z0000 Encounter for general adult medical examination without abnormal findings: Secondary | ICD-10-CM

## 2013-06-23 DIAGNOSIS — M67919 Unspecified disorder of synovium and tendon, unspecified shoulder: Secondary | ICD-10-CM

## 2013-06-23 DIAGNOSIS — M25511 Pain in right shoulder: Secondary | ICD-10-CM

## 2013-06-23 DIAGNOSIS — M67912 Unspecified disorder of synovium and tendon, left shoulder: Secondary | ICD-10-CM

## 2013-06-23 DIAGNOSIS — M810 Age-related osteoporosis without current pathological fracture: Secondary | ICD-10-CM

## 2013-06-23 LAB — COMPREHENSIVE METABOLIC PANEL
ALT: 29 U/L (ref 0–35)
AST: 27 U/L (ref 0–37)
Albumin: 4.2 g/dL (ref 3.5–5.2)
Alkaline Phosphatase: 54 U/L (ref 39–117)
BUN: 11 mg/dL (ref 6–23)
CO2: 30 mEq/L (ref 19–32)
Calcium: 9.7 mg/dL (ref 8.4–10.5)
Chloride: 103 mEq/L (ref 96–112)
Creatinine, Ser: 0.9 mg/dL (ref 0.4–1.2)
GFR: 67.56 mL/min (ref >60.00)
Glucose, Bld: 90 mg/dL (ref 70–99)
Potassium: 3.9 mEq/L (ref 3.5–5.1)
Sodium: 140 mEq/L (ref 135–145)
Total Bilirubin: 0.8 mg/dL (ref 0.3–1.2)
Total Protein: 7.2 g/dL (ref 6.0–8.3)

## 2013-06-23 LAB — CBC WITH DIFFERENTIAL/PLATELET
Basophils Relative: 0.6 % (ref 0.0–3.0)
Eosinophils Absolute: 0.1 10*3/uL (ref 0.0–0.7)
Eosinophils Relative: 2 % (ref 0.0–5.0)
Lymphocytes Relative: 30.6 % (ref 12.0–46.0)
Monocytes Absolute: 0.6 10*3/uL (ref 0.1–1.0)
Neutro Abs: 3.5 10*3/uL (ref 1.4–7.7)
Neutrophils Relative %: 56.9 % (ref 43.0–77.0)
Platelets: 178 10*3/uL (ref 150.0–400.0)
RDW: 13.6 % (ref 11.5–14.6)

## 2013-06-23 LAB — URINALYSIS, ROUTINE W REFLEX MICROSCOPIC
Leukocytes, UA: NEGATIVE
RBC / HPF: NONE SEEN (ref 0–?)
Specific Gravity, Urine: 1.01 (ref 1.000–1.030)
Urine Glucose: NEGATIVE
Urobilinogen, UA: 0.2 (ref 0.0–1.0)
WBC, UA: NONE SEEN (ref 0–?)
pH: 6 (ref 5.0–8.0)

## 2013-06-23 LAB — LIPID PANEL
Cholesterol: 246 mg/dL — ABNORMAL HIGH (ref 0–200)
HDL: 75.5 mg/dL (ref >39.00)
Total CHOL/HDL Ratio: 3
Triglycerides: 77 mg/dL (ref 0.0–149.0)
VLDL: 15.4 mg/dL (ref 0.0–40.0)

## 2013-06-23 MED ORDER — CELECOXIB 200 MG PO CAPS: 200.0000 mg | ORAL_CAPSULE | Freq: Every day | ORAL | Status: DC

## 2013-06-23 MED ORDER — HYDROCHLOROTHIAZIDE 12.5 MG PO CAPS: 12.5000 mg | ORAL_CAPSULE | Freq: Every day | ORAL | Status: DC

## 2013-06-23 MED ORDER — HYDROCODONE-ACETAMINOPHEN 5-325 MG PO TABS: 1.0000 | ORAL_TABLET | Freq: Four times a day (QID) | ORAL | Status: DC | PRN

## 2013-06-23 NOTE — Assessment & Plan Note (Signed)
Her BP is well controlled Today will check her lytes and renal function

## 2013-06-23 NOTE — Assessment & Plan Note (Addendum)

## 2013-06-23 NOTE — Assessment & Plan Note (Addendum)
EKG shows sinus brady (HR of 50) but no acute changes She has had some symptoms but no syncope - I do not see anything urgent to treat but I have asked her to see cardiology about this

## 2013-06-23 NOTE — Assessment & Plan Note (Signed)
FLP CMP and TSH today Will advise on treatment if needed

## 2013-06-23 NOTE — Assessment & Plan Note (Addendum)
This appears to be FMG, will continue lyrica and Start celebrex

## 2013-06-23 NOTE — Assessment & Plan Note (Signed)
I have asked her to stop motrin, will control the pain with celebrex and norco She will f/up with her ortho about this

## 2013-06-23 NOTE — Patient Instructions (Signed)
Degenerative Arthritis You have osteoarthritis. This is the wear and tear arthritis that comes with aging. It is also called degenerative arthritis. This is common in people past middle age. It is caused by stress on the joints. The large weight bearing joints of the lower extremities are most often affected. The knees, hips, back, neck, and hands can become painful, swollen, and stiff. This is the most common type of arthritis. It comes on with age, carrying too much weight, or from an injury. Treatment includes resting the sore joint until the pain and swelling improve. Crutches or a walker may be needed for severe flares. Only take over-the-counter or prescription medicines for pain, discomfort, or fever as directed by your caregiver. Local heat therapy may improve motion. Cortisone shots into the joint are sometimes used to reduce pain and swelling during flares. Osteoarthritis is usually not crippling and progresses slowly. There are things you can do to decrease pain:  Avoid high impact activities.  Exercise regularly.  Low impact exercises such as walking, biking and swimming help to keep the muscles strong and keep normal joint function.  Stretching helps to keep your range of motion.  Lose weight if you are overweight. This reduces joint stress. In severe cases when you have pain at rest or increasing disability, joint surgery may be helpful. See your caregiver for follow-up treatment as recommended.  SEEK IMMEDIATE MEDICAL CARE IF:   You have severe joint pain.  Marked swelling and redness in your joint develops.  You develop a high fever. Document Released: 11/03/2005 Document Revised: 01/26/2012 Document Reviewed: 04/05/2007 [facility] Patient Information 2014 [place], [place]. Preventive Care for Adults, Female A healthy lifestyle and preventive care can promote health and wellness. Preventive health guidelines for women include the following key practices.  A routine yearly  physical is a good way to check with your caregiver about your health and preventive screening. It is a chance to share any concerns and updates on your health, and to receive a thorough exam.  Visit your dentist for a routine exam and preventive care every 6 months. Brush your teeth twice a day and floss once a day. Good oral hygiene prevents tooth decay and gum disease.  The frequency of eye exams is based on your age, health, family medical history, use of contact lenses, and other factors. Follow your caregiver's recommendations for frequency of eye exams.  Eat a healthy diet. Foods like vegetables, fruits, whole grains, low-fat dairy products, and lean protein foods contain the nutrients you need without too many calories. Decrease your intake of foods high in solid fats, added sugars, and salt. Eat the right amount of calories for you.Get information about a proper diet from your caregiver, if necessary.  Regular physical exercise is one of the most important things you can do for your health. Most adults should get at least 150 minutes of moderate-intensity exercise (any activity that increases your heart rate and causes you to sweat) each week. In addition, most adults need muscle-strengthening exercises on 2 or more days a week.  Maintain a healthy weight. The body mass index (BMI) is a screening tool to identify possible weight problems. It provides an estimate of body fat based on height and weight. Your caregiver can help determine your BMI, and can help you achieve or maintain a healthy weight.For adults 20 years and older:  A BMI below 18.5 is considered underweight.  A BMI of 18.5 to 24.9 is normal.  A BMI of 25 to 29.9  is considered overweight.  A BMI of 30 and above is considered obese.  Maintain normal blood lipids and cholesterol levels by exercising and minimizing your intake of saturated fat. Eat a balanced diet with plenty of fruit and vegetables. Blood tests for lipids  and cholesterol should begin at age 14 and be repeated every 5 years. If your lipid or cholesterol levels are high, you are over 50, or you are at high risk for heart disease, you may need your cholesterol levels checked more frequently.Ongoing high lipid and cholesterol levels should be treated with medicines if diet and exercise are not effective.  If you smoke, find out from your caregiver how to quit. If you do not use tobacco, do not start.  If you are pregnant, do not drink alcohol. If you are breastfeeding, be very cautious about drinking alcohol. If you are not pregnant and choose to drink alcohol, do not exceed 1 drink per day. One drink is considered to be 12 ounces (355 mL) of beer, 5 ounces (148 mL) of wine, or 1.5 ounces (44 mL) of liquor.  Avoid use of street drugs. Do not share needles with anyone. Ask for help if you need support or instructions about stopping the use of drugs.  High blood pressure causes heart disease and increases the risk of stroke. Your blood pressure should be checked at least every 1 to 2 years. Ongoing high blood pressure should be treated with medicines if weight loss and exercise are not effective.  If you are 63 to 65 years old, ask your caregiver if you should take aspirin to prevent strokes.  Diabetes screening involves taking a blood sample to check your fasting blood sugar level. This should be done once every 3 years, after age 63, if you are within normal weight and without risk factors for diabetes. Testing should be considered at a younger age or be carried out more frequently if you are overweight and have at least 1 risk factor for diabetes.  Breast cancer screening is essential preventive care for women. You should practice "breast self-awareness." This means understanding the normal appearance and feel of your breasts and may include breast self-examination. Any changes detected, no matter how small, should be reported to a caregiver. Women in  their 41s and 30s should have a clinical breast exam (CBE) by a caregiver as part of a regular health exam every 1 to 3 years. After age 55, women should have a CBE every year. Starting at age 55, women should consider having a mammography (breast X-ray test) every year. Women who have a family history of breast cancer should talk to their caregiver about genetic screening. Women at a high risk of breast cancer should talk to their caregivers about having magnetic resonance imaging (MRI) and a mammography every year.  The Pap test is a screening test for cervical cancer. A Pap test can show cell changes on the cervix that might become cervical cancer if left untreated. A Pap test is a procedure in which cells are obtained and examined from the lower end of the uterus (cervix).  Women should have a Pap test starting at age 17.  Between ages 66 and 60, Pap tests should be repeated every 2 years.  Beginning at age 34, you should have a Pap test every 3 years as long as the past 3 Pap tests have been normal.  Some women have medical problems that increase the chance of getting cervical cancer. Talk to your caregiver about  these problems. It is especially important to talk to your caregiver if a new problem develops soon after your last Pap test. In these cases, your caregiver may recommend more frequent screening and Pap tests.  The above recommendations are the same for women who have or have not gotten the vaccine for human papillomavirus (HPV).  If you had a hysterectomy for a problem that was not cancer or a condition that could lead to cancer, then you no longer need Pap tests. Even if you no longer need a Pap test, a regular exam is a good idea to make sure no other problems are starting.  If you are between ages 4 and 3, and you have had normal Pap tests going back 10 years, you no longer need Pap tests. Even if you no longer need a Pap test, a regular exam is a good idea to make sure no other  problems are starting.  If you have had past treatment for cervical cancer or a condition that could lead to cancer, you need Pap tests and screening for cancer for at least 20 years after your treatment.  If Pap tests have been discontinued, risk factors (such as a new sexual partner) need to be reassessed to determine if screening should be resumed.  The HPV test is an additional test that may be used for cervical cancer screening. The HPV test looks for the virus that can cause the cell changes on the cervix. The cells collected during the Pap test can be tested for HPV. The HPV test could be used to screen women aged 62 years and older, and should be used in women of any age who have unclear Pap test results. After the age of 26, women should have HPV testing at the same frequency as a Pap test.  Colorectal cancer can be detected and often prevented. Most routine colorectal cancer screening begins at the age of 20 and continues through age 64. However, your caregiver may recommend screening at an earlier age if you have risk factors for colon cancer. On a yearly basis, your caregiver may provide home test kits to check for hidden blood in the stool. Use of a small camera at the end of a tube, to directly examine the colon (sigmoidoscopy or colonoscopy), can detect the earliest forms of colorectal cancer. Talk to your caregiver about this at age 11, when routine screening begins. Direct examination of the colon should be repeated every 5 to 10 years through age 21, unless early forms of pre-cancerous polyps or small growths are found.  Hepatitis C blood testing is recommended for all people born from [birth year] through 1965 and any individual with known risks for hepatitis C.  Practice safe sex. Use condoms and avoid high-risk sexual practices to reduce the spread of sexually transmitted infections (STIs). STIs include gonorrhea, chlamydia, syphilis, trichomonas, herpes, HPV, and human immunodeficiency  virus (HIV). Herpes, HIV, and HPV are viral illnesses that have no cure. They can result in disability, cancer, and death. Sexually active women aged 68 and younger should be checked for chlamydia. Older women with new or multiple partners should also be tested for chlamydia. Testing for other STIs is recommended if you are sexually active and at increased risk.  Osteoporosis is a disease in which the bones lose minerals and strength with aging. This can result in serious bone fractures. The risk of osteoporosis can be identified using a bone density scan. Women ages 27 and over and women at risk for  fractures or osteoporosis should discuss screening with their caregivers. Ask your caregiver whether you should take a calcium supplement or vitamin D to reduce the rate of osteoporosis.  Menopause can be associated with physical symptoms and risks. Hormone replacement therapy is available to decrease symptoms and risks. You should talk to your caregiver about whether hormone replacement therapy is right for you.  Use sunscreen with sun protection factor (SPF) of 30 or more. Apply sunscreen liberally and repeatedly throughout the day. You should seek shade when your shadow is shorter than you. Protect yourself by wearing long sleeves, pants, a wide-brimmed hat, and sunglasses year round, whenever you are outdoors.  Once a month, do a whole body skin exam, using a mirror to look at the skin on your back. Notify your caregiver of new moles, moles that have irregular borders, moles that are larger than a pencil eraser, or moles that have changed in shape or color.  Stay current with required immunizations.  Influenza. You need a dose every fall (or winter). The composition of the flu vaccine changes each year, so being vaccinated once is not enough.  Pneumococcal polysaccharide. You need 1 to 2 doses if you smoke cigarettes or if you have certain chronic medical conditions. You need 1 dose at age 79 (or  older) if you have never been vaccinated.  Tetanus, diphtheria, pertussis (Tdap, Td). Get 1 dose of Tdap vaccine if you are younger than age 53, are over 51 and have contact with an infant, are a Research scientist (physical sciences), are pregnant, or simply want to be protected from whooping cough. After that, you need a Td booster dose every 10 years. Consult your caregiver if you have not had at least 3 tetanus and diphtheria-containing shots sometime in your life or have a deep or dirty wound.  HPV. You need this vaccine if you are a woman age 35 or younger. The vaccine is given in 3 doses over 6 months.  Measles, mumps, rubella (MMR). You need at least 1 dose of MMR if you were born in 1957 or later. You may also need a second dose.  Meningococcal. If you are age 32 to 44 and a first-year college student living in a residence hall, or have one of several medical conditions, you need to get vaccinated against meningococcal disease. You may also need additional booster doses.  Zoster (shingles). If you are age 73 or older, you should get this vaccine.  Varicella (chickenpox). If you have never had chickenpox or you were vaccinated but received only 1 dose, talk to your caregiver to find out if you need this vaccine.  Hepatitis A. You need this vaccine if you have a specific risk factor for hepatitis A virus infection or you simply wish to be protected from this disease. The vaccine is usually given as 2 doses, 6 to 18 months apart.  Hepatitis B. You need this vaccine if you have a specific risk factor for hepatitis B virus infection or you simply wish to be protected from this disease. The vaccine is given in 3 doses, usually over 6 months. Preventive Services / Frequency Ages 44 to 44  Blood pressure check.** / Every 1 to 2 years.  Lipid and cholesterol check.** / Every 5 years beginning at age 70.  Clinical breast exam.** / Every 3 years for women in their 70s and 30s.  Pap test.** / Every 2 years from  ages 24 through 26. Every 3 years starting at age 60 through age 34  or 70 with a history of 3 consecutive normal Pap tests.  HPV screening.** / Every 3 years from ages 23 through ages 35 to 45 with a history of 3 consecutive normal Pap tests.  Hepatitis C blood test.** / For any individual with known risks for hepatitis C.  Skin self-exam. / Monthly.  Influenza immunization.** / Every year.  Pneumococcal polysaccharide immunization.** / 1 to 2 doses if you smoke cigarettes or if you have certain chronic medical conditions.  Tetanus, diphtheria, pertussis (Tdap, Td) immunization. / A one-time dose of Tdap vaccine. After that, you need a Td booster dose every 10 years.  HPV immunization. / 3 doses over 6 months, if you are 22 and younger.  Measles, mumps, rubella (MMR) immunization. / You need at least 1 dose of MMR if you were born in 1957 or later. You may also need a second dose.  Meningococcal immunization. / 1 dose if you are age 68 to 30 and a first-year college student living in a residence hall, or have one of several medical conditions, you need to get vaccinated against meningococcal disease. You may also need additional booster doses.  Varicella immunization.** / Consult your caregiver.  Hepatitis A immunization.** / Consult your caregiver. 2 doses, 6 to 18 months apart.  Hepatitis B immunization.** / Consult your caregiver. 3 doses usually over 6 months. Ages 76 to 69  Blood pressure check.** / Every 1 to 2 years.  Lipid and cholesterol check.** / Every 5 years beginning at age 52.  Clinical breast exam.** / Every year after age 27.  Mammogram.** / Every year beginning at age 40 and continuing for as long as you are in good health. Consult with your caregiver.  Pap test.** / Every 3 years starting at age 82 through age 89 or 67 with a history of 3 consecutive normal Pap tests.  HPV screening.** / Every 3 years from ages 58 through ages 59 to 50 with a history of 3  consecutive normal Pap tests.  Fecal occult blood test (FOBT) of stool. / Every year beginning at age 81 and continuing until age 19. You may not need to do this test if you get a colonoscopy every 10 years.  Flexible sigmoidoscopy or colonoscopy.** / Every 5 years for a flexible sigmoidoscopy or every 10 years for a colonoscopy beginning at age 19 and continuing until age 84.  Hepatitis C blood test.** / For all people born from [birth year] through 1963 and any individual with known risks for hepatitis C.  Skin self-exam. / Monthly.  Influenza immunization.** / Every year.  Pneumococcal polysaccharide immunization.** / 1 to 2 doses if you smoke cigarettes or if you have certain chronic medical conditions.  Tetanus, diphtheria, pertussis (Tdap, Td) immunization.** / A one-time dose of Tdap vaccine. After that, you need a Td booster dose every 10 years.  Measles, mumps, rubella (MMR) immunization. / You need at least 1 dose of MMR if you were born in 1957 or later. You may also need a second dose.  Varicella immunization.** / Consult your caregiver.  Meningococcal immunization.** / Consult your caregiver.  Hepatitis A immunization.** / Consult your caregiver. 2 doses, 6 to 18 months apart.  Hepatitis B immunization.** / Consult your caregiver. 3 doses, usually over 6 months. Ages 55 and over  Blood pressure check.** / Every 1 to 2 years.  Lipid and cholesterol check.** / Every 5 years beginning at age 84.  Clinical breast exam.** / Every year after age 60.  Mammogram.** / Every year beginning at age 61 and continuing for as long as you are in good health. Consult with your caregiver.  Pap test.** / Every 3 years starting at age 23 through age 21 or 38 with a 3 consecutive normal Pap tests. Testing can be stopped between 65 and 70 with 3 consecutive normal Pap tests and no abnormal Pap or HPV tests in the past 10 years.  HPV screening.** / Every 3 years from ages 89 through ages 46 or 22  with a history of 3 consecutive normal Pap tests. Testing can be stopped between 65 and 70 with 3 consecutive normal Pap tests and no abnormal Pap or HPV tests in the past 10 years.  Fecal occult blood test (FOBT) of stool. / Every year beginning at age 62 and continuing until age 68. You may not need to do this test if you get a colonoscopy every 10 years.  Flexible sigmoidoscopy or colonoscopy.** / Every 5 years for a flexible sigmoidoscopy or every 10 years for a colonoscopy beginning at age 76 and continuing until age 39.  Hepatitis C blood test.** / For all people born from [birth year] through 1963 and any individual with known risks for hepatitis C.  Osteoporosis screening.** / A one-time screening for women ages 75 and over and women at risk for fractures or osteoporosis.  Skin self-exam. / Monthly.  Influenza immunization.** / Every year.  Pneumococcal polysaccharide immunization.** / 1 dose at age 79 (or older) if you have never been vaccinated.  Tetanus, diphtheria, pertussis (Tdap, Td) immunization. / A one-time dose of Tdap vaccine if you are over 65 and have contact with an infant, are a Research scientist (physical sciences), or simply want to be protected from whooping cough. After that, you need a Td booster dose every 10 years.  Varicella immunization.** / Consult your caregiver.  Meningococcal immunization.** / Consult your caregiver.  Hepatitis A immunization.** / Consult your caregiver. 2 doses, 6 to 18 months apart.  Hepatitis B immunization.** / Check with your caregiver. 3 doses, usually over 6 months. ** Family history and personal history of risk and conditions may change your caregiver's recommendations. Document Released: 12/30/2001 Document Revised: 01/26/2012 Document Reviewed: 03/31/2011 [facility] Patient Information 2014 [place], [place].

## 2013-06-23 NOTE — Progress Notes (Signed)
Subjective:    Patient ID: Pam Scott, female    DOB: 1946-10-08, 67 y.o.   MRN: [id number]  Hypertension This is a chronic problem. The problem is unchanged. The problem is controlled. Associated symptoms include malaise/fatigue (she has had some dizzy spells recently) and peripheral edema (occasionally, none today). Pertinent negatives include no anxiety, blurred vision, chest pain, headaches, neck pain, orthopnea, palpitations, PND, shortness of breath or sweats. Agents associated with hypertension include NSAIDs. Past treatments include diuretics. The current treatment provides significant improvement. There are no compliance problems.       Review of Systems  Constitutional: Positive for malaise/fatigue (she has had some dizzy spells recently) and fatigue. Negative for fever, chills, diaphoresis, activity change, appetite change and unexpected weight change.  HENT: Negative.  Negative for neck pain.   Eyes: Negative.  Negative for blurred vision.  Respiratory: Negative.  Negative for cough, choking, chest tightness, shortness of breath, wheezing and stridor.   Cardiovascular: Negative.  Negative for chest pain, palpitations, orthopnea, leg swelling and PND.  Gastrointestinal: Negative.  Negative for nausea, vomiting, abdominal pain, diarrhea and constipation.  Endocrine: Negative.   Genitourinary: Negative.   Musculoskeletal: Positive for myalgias and arthralgias (right shoulder pain). Negative for back pain, joint swelling and gait problem.  Skin: Negative.   Allergic/Immunologic: Negative.   Neurological: Positive for dizziness and light-headedness. Negative for tremors, seizures, syncope, facial asymmetry, speech difficulty, weakness, numbness and headaches.  Hematological: Negative.  Negative for adenopathy. Does not bruise/bleed easily.  Psychiatric/Behavioral: Negative.        Objective:   Physical Exam  Vitals reviewed. Constitutional: She is oriented to person, place,  and time. She appears well-developed and well-nourished. No distress.  HENT:  Head: Normocephalic and atraumatic.  Mouth/Throat: Oropharynx is clear and moist. No oropharyngeal exudate.  Eyes: Conjunctivae are normal. Right eye exhibits no discharge. Left eye exhibits no discharge. No scleral icterus.  Neck: Normal range of motion. Neck supple. No JVD present. No tracheal deviation present. No thyromegaly present.  Cardiovascular: Regular rhythm, S1 normal, S2 normal, normal heart sounds and intact distal pulses.  Bradycardia present.  Exam reveals no gallop.   No murmur heard. Pulses:      Carotid pulses are 1+ on the right side, and 1+ on the left side.      Radial pulses are 1+ on the right side, and 1+ on the left side.       Femoral pulses are 1+ on the right side, and 1+ on the left side.      Popliteal pulses are 1+ on the right side, and 1+ on the left side.       Dorsalis pedis pulses are 1+ on the right side, and 1+ on the left side.       Posterior tibial pulses are 1+ on the right side, and 1+ on the left side.  Pulmonary/Chest: Effort normal and breath sounds normal. No stridor. No respiratory distress. She has no wheezes. She has no rales. She exhibits no tenderness.  Abdominal: Soft. Bowel sounds are normal. She exhibits no distension and no mass. There is no tenderness. There is no rebound and no guarding.  Musculoskeletal: Normal range of motion. She exhibits no edema and no tenderness.  Lymphadenopathy:    She has no cervical adenopathy.  Neurological: She is oriented to person, place, and time.  Skin: Skin is warm and dry. No rash noted. She is not diaphoretic. No erythema. No pallor.  Psychiatric: She has a normal mood  and affect. Her behavior is normal. Judgment and thought content normal.     Lab Results  Component Value Date   WBC 6.0 04/19/2012   HGB 12.4 04/19/2012   HCT 37.6 04/19/2012   PLT 176.0 04/19/2012   GLUCOSE 98 07/28/2012   CHOL 173 04/19/2012   TRIG 63.0  04/19/2012   HDL 83.20 04/19/2012   LDLDIRECT 134.0 03/27/2011   LDLCALC 77 04/19/2012   ALT 22 07/28/2012   AST 25 07/28/2012   NA 140 07/28/2012   K 4.2 07/28/2012   CL 106 07/28/2012   CREATININE 1.0 07/28/2012   BUN 17 07/28/2012   CO2 24 07/28/2012   TSH 3.19 04/19/2012       Assessment & Plan:

## 2013-06-24 ENCOUNTER — Encounter: Payer: Self-pay | Admitting: Internal Medicine

## 2013-07-06 ENCOUNTER — Ambulatory Visit (INDEPENDENT_AMBULATORY_CARE_PROVIDER_SITE_OTHER): Payer: BC Managed Care – PPO | Admitting: Internal Medicine

## 2013-07-06 ENCOUNTER — Encounter: Payer: Self-pay | Admitting: Internal Medicine

## 2013-07-06 ENCOUNTER — Ambulatory Visit (INDEPENDENT_AMBULATORY_CARE_PROVIDER_SITE_OTHER): Payer: BC Managed Care – PPO | Admitting: Family Medicine

## 2013-07-06 ENCOUNTER — Encounter: Payer: Self-pay | Admitting: Family Medicine

## 2013-07-06 ENCOUNTER — Ambulatory Visit (INDEPENDENT_AMBULATORY_CARE_PROVIDER_SITE_OTHER)
Admission: RE | Admit: 2013-07-06 | Discharge: 2013-07-06 | Disposition: A | Discharge status: Home / Self Care | Payer: BC Managed Care – PPO | Source: Ambulatory Visit | Attending: Internal Medicine | Admitting: Internal Medicine

## 2013-07-06 VITALS — BP 140/92 | HR 70 | Wt 143.0 lb

## 2013-07-06 VITALS — BP 140/92 | HR 70 | Temp 98.2°F | Resp 16 | Ht 64.0 in | Wt 143.0 lb

## 2013-07-06 DIAGNOSIS — M67911 Unspecified disorder of synovium and tendon, right shoulder: Secondary | ICD-10-CM

## 2013-07-06 DIAGNOSIS — M5412 Radiculopathy, cervical region: Secondary | ICD-10-CM

## 2013-07-06 DIAGNOSIS — I10 Essential (primary) hypertension: Secondary | ICD-10-CM

## 2013-07-06 DIAGNOSIS — M67919 Unspecified disorder of synovium and tendon, unspecified shoulder: Secondary | ICD-10-CM

## 2013-07-06 DIAGNOSIS — R937 Abnormal findings on diagnostic imaging of other parts of musculoskeletal system: Secondary | ICD-10-CM

## 2013-07-06 MED ORDER — TIZANIDINE HCL 4 MG PO TABS: 4.0000 mg | ORAL_TABLET | Freq: Four times a day (QID) | ORAL | Status: DC | PRN

## 2013-07-06 NOTE — Assessment & Plan Note (Signed)
Patient has findings of rotator cuff syndrome with likely having a partial tear that seems to have healed with calcific tendinosis now of the supraspinatus tendon. Patient had injection as described above and had almost entire resolution of pain immediately. This signifies that most of her pain is secondary to effusion and swelling. Patient given home exercise program and discussed proper technique. Discuss sleeping position and other lifestyle modifications. Patient will continue with her Celebrex which I think will be helpful. Patient will come back again in 3 weeks for further evaluation. She continues to have pain at that time we'll send to formal physical therapy and start nitroglycerin patch.

## 2013-07-06 NOTE — Assessment & Plan Note (Signed)
I do not believe from patient's cervical x-rays that this is true radiculitis and nerve impingement causing any symptoms. The patient does have some more early osteoarthritic changes. Encourage her to continue the Celebrex, given handout of different range of motion exercises, discussed back exercises that could be beneficial and she will continue her Zanaflex and Lyrica as well. She does have early os arthritic changes and Tylenol may be of benefit in the future.

## 2013-07-06 NOTE — Assessment & Plan Note (Signed)
She has adequate BP control 

## 2013-07-06 NOTE — Assessment & Plan Note (Signed)
In light of the xray report I will order an MRI to see if there is spinal stenosis, nerve impingement, etc She will continue the current meds for pain and will add a MR as well

## 2013-07-06 NOTE — Patient Instructions (Addendum)
Torticollis, Acute °You have suddenly (acutely) developed a twisted neck (torticollis). This is usually a self-limited condition. °CAUSES  °Acute torticollis may be caused by malposition, trauma or infection. Most commonly, acute torticollis is caused by sleeping in an awkward position. Torticollis may also be caused by the flexion, extension or twisting of the neck muscles beyond their normal position. Sometimes, the exact cause may not be known. °SYMPTOMS  °Usually, there is pain and limited movement of the neck. Your neck may twist to one side. °DIAGNOSIS  °The diagnosis is often made by physical examination. X-rays, CT scans or MRIs may be done if there is a history of trauma or concern of infection. °TREATMENT  °For a common, stiff neck that develops during sleep, treatment is focused on relaxing the contracted neck muscle. Medications (including shots) may be used to treat the problem. Most cases resolve in several days. Torticollis usually responds to conservative physical therapy. If left untreated, the shortened and spastic neck muscle can cause deformities in the face and neck. Rarely, surgery is required. °HOME CARE INSTRUCTIONS  °· Use over-the-counter and prescription medications as directed by your caregiver. °· Do stretching exercises and massage the neck as directed by your caregiver. °· Follow up with physical therapy if needed and as directed by your caregiver. °SEEK IMMEDIATE MEDICAL CARE IF:  °· You develop difficulty breathing or noisy breathing (stridor). °· You drool, develop trouble swallowing or have pain with swallowing. °· You develop numbness or weakness in the hands or feet. °· You have changes in speech or vision. °· You have problems with urination or bowel movements. °· You have difficulty walking. °· You have a fever. °· You have increased pain. °MAKE SURE YOU:  °· Understand these instructions. °· Will watch your condition. °· Will get help right away if you are not doing well or  get worse. °Document Released: 10/31/2000 Document Revised: 01/26/2012 Document Reviewed: 12/12/2009 °[employer]® Patient Information ©2014 [employer]. ° °
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
## 2013-07-06 NOTE — Progress Notes (Signed)
Subjective:    Patient ID: Pam Scott, female    DOB: 12/20/1945, 67 y.o.   MRN: [id number]  Neck Pain  This is a recurrent problem. The current episode started 1 to 4 weeks ago. The problem occurs intermittently. The problem has been gradually worsening. The pain is associated with nothing. The pain is present in the right side. The quality of the pain is described as aching and stabbing. The pain is at a severity of 5/10. The pain is moderate. The symptoms are aggravated by twisting. Pertinent negatives include no chest pain, fever, headaches, leg pain, numbness, pain with swallowing, paresis, photophobia, syncope, tingling, trouble swallowing, visual change, weakness or weight loss. She has tried acetaminophen, NSAIDs and oral narcotics for the symptoms. The treatment provided moderate relief.      Review of Systems  Constitutional: Negative.  Negative for fever and weight loss.  HENT: Positive for neck pain. Negative for trouble swallowing and neck stiffness.   Eyes: Negative.  Negative for photophobia.  Respiratory: Negative.  Negative for cough, chest tightness, shortness of breath, wheezing and stridor.   Cardiovascular: Negative.  Negative for chest pain, leg swelling and syncope.  Gastrointestinal: Negative for vomiting, abdominal pain, diarrhea, constipation and blood in stool.  Endocrine: Negative.   Genitourinary: Negative.   Musculoskeletal: Positive for arthralgias (right shoulder). Negative for myalgias, back pain, joint swelling and gait problem.  Skin: Negative.   Allergic/Immunologic: Negative.   Neurological: Negative.  Negative for dizziness, tingling, weakness, numbness and headaches.  Hematological: Negative.  Negative for adenopathy. Does not bruise/bleed easily.  Psychiatric/Behavioral: Negative.        Objective:   Physical Exam  Vitals reviewed. Constitutional: She is oriented to person, place, and time. She appears well-developed and well-nourished. No  distress.  HENT:  Head: Normocephalic and atraumatic.  Mouth/Throat: Oropharynx is clear and moist. No oropharyngeal exudate.  Eyes: Conjunctivae are normal. Right eye exhibits no discharge. Left eye exhibits no discharge. No scleral icterus.  Neck: Normal range of motion. Neck supple. No JVD present. No tracheal deviation present. No thyromegaly present.  Cardiovascular: Normal rate, regular rhythm, normal heart sounds and intact distal pulses.  Exam reveals no gallop and no friction rub.   No murmur heard. Pulmonary/Chest: Effort normal and breath sounds normal. No stridor. No respiratory distress. She has no wheezes. She has no rales. She exhibits no tenderness.  Abdominal: Soft. Bowel sounds are normal. She exhibits no distension and no mass. There is no tenderness. There is no rebound and no guarding.  Musculoskeletal: Normal range of motion. She exhibits no edema and no tenderness.       Cervical back: Normal. She exhibits normal range of motion, no tenderness, no bony tenderness, no swelling, no edema, no deformity, no laceration, no pain, no spasm and normal pulse.  Lymphadenopathy:    She has no cervical adenopathy.  Neurological: She is oriented to person, place, and time.  Skin: Skin is warm and dry. No rash noted. She is not diaphoretic. No erythema. No pallor.  Psychiatric: She has a normal mood and affect. Her behavior is normal. Judgment and thought content normal.    Lab Results  Component Value Date   WBC 6.1 06/23/2013   HGB 13.5 06/23/2013   HCT 40.0 06/23/2013   PLT 178.0 06/23/2013   GLUCOSE 90 06/23/2013   CHOL 246* 06/23/2013   TRIG 77.0 06/23/2013   HDL 75.50 06/23/2013   LDLDIRECT 159.9 06/23/2013   LDLCALC 77 04/19/2012   ALT 29  06/23/2013   AST 27 06/23/2013   NA 140 06/23/2013   K 3.9 06/23/2013   CL 103 06/23/2013   CREATININE 0.9 06/23/2013   BUN 11 06/23/2013   CO2 30 06/23/2013   TSH 2.15 06/23/2013        Assessment & Plan:

## 2013-07-07 ENCOUNTER — Telehealth: Payer: Self-pay | Admitting: *Deleted

## 2013-07-07 NOTE — Telephone Encounter (Signed)
I sent her a note in MyChart

## 2013-07-07 NOTE — Telephone Encounter (Signed)
Pt called requesting xray results.  Further states she is concerned she has been scheduled for an MRI but has not received her xray results.  Please advise

## 2013-07-07 NOTE — Telephone Encounter (Signed)
Patient informed to check mychart, will call back with any further questions

## 2013-07-11 ENCOUNTER — Other Ambulatory Visit: Payer: Self-pay | Admitting: *Deleted

## 2013-07-11 DIAGNOSIS — IMO0001 Reserved for inherently not codable concepts without codable children: Secondary | ICD-10-CM

## 2013-07-11 DIAGNOSIS — M67912 Unspecified disorder of synovium and tendon, left shoulder: Secondary | ICD-10-CM

## 2013-07-12 ENCOUNTER — Encounter: Payer: Self-pay | Admitting: Internal Medicine

## 2013-07-13 ENCOUNTER — Encounter: Payer: Self-pay | Admitting: Cardiovascular Disease

## 2013-07-13 ENCOUNTER — Ambulatory Visit (INDEPENDENT_AMBULATORY_CARE_PROVIDER_SITE_OTHER): Payer: BC Managed Care – PPO | Admitting: Cardiovascular Disease

## 2013-07-13 VITALS — BP 150/87 | HR 67 | Ht 64.0 in | Wt 139.0 lb

## 2013-07-13 DIAGNOSIS — R001 Bradycardia, unspecified: Secondary | ICD-10-CM

## 2013-07-13 DIAGNOSIS — I10 Essential (primary) hypertension: Secondary | ICD-10-CM

## 2013-07-13 DIAGNOSIS — E785 Hyperlipidemia, unspecified: Secondary | ICD-10-CM

## 2013-07-13 DIAGNOSIS — I498 Other specified cardiac arrhythmias: Secondary | ICD-10-CM

## 2013-07-13 NOTE — Assessment & Plan Note (Signed)
Well controlled.  Continue current medications and low sodium Dash type diet.   Avoid beta blockers 

## 2013-07-13 NOTE — Patient Instructions (Signed)
Your physician recommends that you schedule a follow-up appointment in: AS NEEDED  Your physician recommends that you continue on your current medications as directed. Please refer to the Current Medication list given to you today.  

## 2013-07-13 NOTE — Assessment & Plan Note (Signed)
Asymptomatic and benign no change in ECG for 3 years and normal HR response to exercise.  No further w/u unless she has new symptoms

## 2013-07-13 NOTE — Progress Notes (Signed)
Patient ID: Pam Scott, female   DOB: Dec 17, 1945, 67 y.o.   MRN: [id number] 67 yo referred for bradycardia  She is asymptomatic.  She is active Works hard in garden.  She has some cervical spine issues and some ? Vertigo and balance issues occasionally.  She note ECG from [facility] years ago with HR in 50's  Reviewed ECG from 2011 and HR 50  Ambulated patient in office and HR easily goes to 82.  No palpitations chest pain or dyspnea She bruises easily.  Not on any heart slowing drugs or AV nodal blocking agents.  No family history of sudden death or pacer. TSH done 2023/07/18 normal  And K 3.9  All other labs reviewed and ok   ROS: Denies fever, malais, weight loss, blurry vision, decreased visual acuity, cough, sputum, SOB, hemoptysis, pleuritic pain, palpitaitons, heartburn, abdominal pain, melena, lower extremity edema, claudication, or rash.  All other systems reviewed and negative   General: Affect appropriate Healthy:  appears stated age HEENT: normal Neck supple with no adenopathy JVP normal no bruits no thyromegaly Lungs clear with no wheezing and good diaphragmatic motion Heart:  S1/S2 no murmur,rub, gallop or click PMI normal Abdomen: benighn, BS positve, no tenderness, no AAA no bruit.  No HSM or HJR Distal pulses intact with no bruits No edema Neuro non-focal Skin warm and dry bruise on right arm No muscular weakness  Medications Current Outpatient Prescriptions  Medication Sig Dispense Refill  . bimatoprost (LUMIGAN) 0.03 % ophthalmic drops 1 drop at bedtime.        . Cholecalciferol (VITAMIN D3) 2000 UNITS TABS Take by mouth.        . citalopram (CELEXA) 20 MG tablet TAKE 1 TABLET ONCE DAILY  90 tablet  3  . estradiol (ESTRACE) 0.5 MG tablet TAKE 1 TABLET ONCE DAILY  90 tablet  3  . hydrochlorothiazide (MICROZIDE) 12.5 MG capsule Take 1 capsule (12.5 mg total) by mouth daily.  90 capsule  2  . HYDROcodone-acetaminophen (NORCO/VICODIN) 5-325 MG per tablet Take 1 tablet by  mouth every 6 (six) hours as needed for pain.  65 tablet  3  . ibuprofen (ADVIL,MOTRIN) 200 MG tablet Take 200 mg by mouth every 6 (six) hours as needed for pain.      . pregabalin (LYRICA) 75 MG capsule Take 1 capsule (75 mg total) by mouth 2 (two) times daily.  180 capsule  1  . aspirin 81 MG tablet Take 81 mg by mouth daily.         No current facility-administered medications for this visit.    Allergies Amlodipine; Simvastatin; and Erythromycin  Family History: Family History  Problem Relation Age of Onset  . Hypertension Father   . Cancer Neg Hx   . Hyperlipidemia Neg Hx   . Heart disease Neg Hx   . Diabetes Neg Hx   . Stroke Neg Hx     Social History: History   Social History  . Marital Status: Married    Spouse Name: N/A    Number of Children: N/A  . Years of Education: N/A   Occupational History  . Not on file.   Social History Main Topics  . Smoking status: Never Smoker   . Smokeless tobacco: Never Used  . Alcohol Use: No  . Drug Use: No  . Sexual Activity: Yes    Birth Control/ Protection: Post-menopausal   Other Topics Concern  . Not on file   Social History Narrative  . No narrative  on file    Electrocardiogram:  8/7 SB rate 50 nonspecific ST/T wave changes normal intervals.  No change from 09/30/10 HR then also 50  Assessment and Plan

## 2013-07-13 NOTE — Assessment & Plan Note (Signed)
Cholesterol is at goal.  Continue current dose of statin and diet Rx.  No myalgias or side effects.  F/U  LFT's in 6 months. Lab Results  Component Value Date   LDLCALC 77 04/19/2012

## 2013-07-14 ENCOUNTER — Ambulatory Visit
Admission: RE | Admit: 2013-07-14 | Discharge: 2013-07-14 | Disposition: A | Discharge status: Home / Self Care | Payer: BC Managed Care – PPO | Source: Ambulatory Visit | Attending: Internal Medicine | Admitting: Internal Medicine

## 2013-07-14 ENCOUNTER — Encounter: Payer: Self-pay | Admitting: Internal Medicine

## 2013-07-14 DIAGNOSIS — R937 Abnormal findings on diagnostic imaging of other parts of musculoskeletal system: Secondary | ICD-10-CM

## 2013-07-14 DIAGNOSIS — M5412 Radiculopathy, cervical region: Secondary | ICD-10-CM

## 2013-07-15 ENCOUNTER — Encounter: Payer: Self-pay | Admitting: Internal Medicine

## 2013-09-06 ENCOUNTER — Telehealth: Payer: Self-pay | Admitting: *Deleted

## 2013-09-06 DIAGNOSIS — M25511 Pain in right shoulder: Secondary | ICD-10-CM

## 2013-09-06 DIAGNOSIS — IMO0001 Reserved for inherently not codable concepts without codable children: Secondary | ICD-10-CM

## 2013-09-06 DIAGNOSIS — M67912 Unspecified disorder of synovium and tendon, left shoulder: Secondary | ICD-10-CM

## 2013-09-06 MED ORDER — HYDROCODONE-ACETAMINOPHEN 5-325 MG PO TABS: 1.0000 | ORAL_TABLET | Freq: Four times a day (QID) | ORAL | Status: DC | PRN

## 2013-09-06 NOTE — Telephone Encounter (Signed)
done

## 2013-09-06 NOTE — Telephone Encounter (Signed)
Pt called requesting Hydrocodone refill.  Please advise 

## 2013-09-07 NOTE — Telephone Encounter (Signed)
Spoke with pt advised rx ready for pick up 

## 2013-09-22 ENCOUNTER — Other Ambulatory Visit: Payer: Self-pay

## 2013-10-19 ENCOUNTER — Ambulatory Visit (INDEPENDENT_AMBULATORY_CARE_PROVIDER_SITE_OTHER)
Admission: RE | Admit: 2013-10-19 | Discharge: 2013-10-19 | Disposition: A | Discharge status: Home / Self Care | Payer: BC Managed Care – PPO | Source: Ambulatory Visit | Attending: Family Medicine | Admitting: Family Medicine

## 2013-10-19 ENCOUNTER — Ambulatory Visit (INDEPENDENT_AMBULATORY_CARE_PROVIDER_SITE_OTHER): Payer: BC Managed Care – PPO | Admitting: Family Medicine

## 2013-10-19 ENCOUNTER — Other Ambulatory Visit: Payer: Self-pay | Admitting: *Deleted

## 2013-10-19 VITALS — BP 136/82 | HR 54 | Wt 142.0 lb

## 2013-10-19 DIAGNOSIS — IMO0001 Reserved for inherently not codable concepts without codable children: Secondary | ICD-10-CM

## 2013-10-19 DIAGNOSIS — M67912 Unspecified disorder of synovium and tendon, left shoulder: Secondary | ICD-10-CM

## 2013-10-19 DIAGNOSIS — M25511 Pain in right shoulder: Secondary | ICD-10-CM

## 2013-10-19 DIAGNOSIS — M25519 Pain in unspecified shoulder: Secondary | ICD-10-CM

## 2013-10-19 DIAGNOSIS — M67919 Unspecified disorder of synovium and tendon, unspecified shoulder: Secondary | ICD-10-CM

## 2013-10-19 DIAGNOSIS — M67911 Unspecified disorder of synovium and tendon, right shoulder: Secondary | ICD-10-CM

## 2013-10-19 DIAGNOSIS — Z23 Encounter for immunization: Secondary | ICD-10-CM

## 2013-10-19 MED ORDER — HYDROCODONE-ACETAMINOPHEN 5-325 MG PO TABS: 1.0000 | ORAL_TABLET | Freq: Four times a day (QID) | ORAL | Status: DC | PRN

## 2013-10-19 NOTE — Patient Instructions (Signed)
Did another injection today Exercises 3 times a week Go to PT 1 time and they will call you Also get xray downstairs today, no news is good news.  Come back again after the new year if still having trouble.

## 2013-10-19 NOTE — Progress Notes (Signed)
CC: Shoulder and neck pain  HPI: Patient is a 67 year old female with a three-month history of right shoulder pain. This has been a recurrent problem. Patient states she had been doing very well with the shoulder pain after last injection but over the course last 2 weeks it started to have pain again. Patient states that the pain is worse when she does overhead moments. Patient states that she had such a good result with the injection she would like to see if that is possible given. Patient denies any new symptoms such as numbness or tingling. Patient does have a degenerative osteoarthritic changes of the neck that was seen previously. Patient states that she still has the neck pain but the shoulder pain seems to be worse. Patient is traveling here for the holidays and would like to be pain free for this interval.  Past medical, surgical, family and social history reviewed. Medications reviewed all in the electronic medical record.   Review of Systems: No headache, visual changes, nausea, vomiting, diarrhea, constipation, dizziness, abdominal pain, skin rash, fevers, chills, night sweats, weight loss, swollen lymph nodes, body aches, joint swelling, muscle aches, chest pain, shortness of breath, mood changes.   Objective:    BP140/92 Pulse  70 Temp(t98.2 F (36.8 C) ) Ht 5'4'' (1.626 m) Wt143 lb (64.864 kg) 24.53 kg/m2     General: No apparent distress alert and oriented x3 mood and affect normal, dressed appropriately.  HEENT: Pupils equal, extraocular movements intact Respiratory: Patient's speak in full sentences and does not appear short of breath Cardiovascular: No lower extremity edema, non tender, no erythema Skin: Warm dry intact with no signs of infection or rash on extremities or on axial skeleton. Abdomen: Soft nontender Neuro: Cranial nerves II through XII are intact, neurovascularly intact in all extremities with 2+ DTRs and 2+ pulses. Lymph: No lymphadenopathy of posterior or  anterior cervical chain or axillae bilaterally.  Gait normal with good balance and coordination.  MSK: Non tender with full range of motion and good stability and symmetric strength and tone of shoulders, elbows, wrist, hip, knee and ankles bilaterally.  Neck: Inspection unremarkable. No palpable stepoffs. Negative Spurling's maneuver. Full neck range of motion Grip strength and sensation normal in bilateral hands Strength good C4 to T1 distribution No sensory change to C4 to T1 Negative Hoffman sign bilaterally Reflexes normal  Shoulder: Right Inspection reveals no abnormalities, atrophy or asymmetry. Palpation is normal with no tenderness over AC joint or bicipital groove. ROM is full in all planes. Patient though does have pain when greater than 170 of forward flexion Rotator cuff strength normal throughout. Positive Neer and Hawkin's tests, empty can sign. Speeds and Yergason's tests normal. No labral pathology noted with negative Obrien's, negative clunk and good stability. Normal scapular function observed. No painful arc and no drop arm sign. No apprehension sign   Procedure: Real-time Ultrasound Guided Injection of right glenohumeral joint Device: GE Logiq E  Ultrasound guided injection is preferred based studies that show increased duration, increased effect, greater accuracy, decreased procedural pain, increased response rate with ultrasound guided versus blind injection.  Verbal informed consent obtained.  Time-out conducted.  Noted no overlying erythema, induration, or other signs of local infection.  Skin prepped in a sterile fashion.  Local anesthesia: Topical Ethyl chloride.  With sterile technique and under real time ultrasound guidance:  Joint visualized.  23g 1  inch needle inserted posterior approach. Pictures taken for needle placement. Patient did have injection of 2 cc of 1%  lidocaine, 2 cc of 0.5% Marcaine, and 1.0 cc of Kenalog 40 mg/dL. Completed without  difficulty  Pain immediately resolved suggesting accurate placement of the medication.  Advised to call if fevers/chills, erythema, induration, drainage, or persistent bleeding.  Images permanently stored and available for review in the ultrasound unit.  Impression: Technically successful ultrasound guided injection.    Impression and Recommendations:     This case required medical decision making of moderate complexity.

## 2013-10-19 NOTE — Telephone Encounter (Signed)
Pt is here for an OV with Dr. Katrinka Blazing & would like a refill on hydrocodone. Last OV 8.20.14 Last filled 10.22.14

## 2013-10-19 NOTE — Assessment & Plan Note (Signed)
Patient did improve after last injection. Patient did have another injection today and he tolerated the procedure very well. Patient will do exercises 3 times weekly Patient will continue her other medications. Discussed that if his pain continued we may need to consider looking at her neck more in tightly. Physical therapy one time

## 2013-10-20 ENCOUNTER — Encounter: Payer: Self-pay | Admitting: Family Medicine

## 2013-10-25 ENCOUNTER — Telehealth: Payer: Self-pay | Admitting: *Deleted

## 2013-10-25 DIAGNOSIS — IMO0001 Reserved for inherently not codable concepts without codable children: Secondary | ICD-10-CM

## 2013-10-25 MED ORDER — PREGABALIN 75 MG PO CAPS: 75.0000 mg | ORAL_CAPSULE | Freq: Two times a day (BID) | ORAL | Status: DC

## 2013-10-25 NOTE — Telephone Encounter (Signed)
Pt called requesting Lyrica refill.  Last OV 8.20.14.  Please advise

## 2013-10-25 NOTE — Telephone Encounter (Signed)
done

## 2013-10-26 NOTE — Telephone Encounter (Signed)
Left message Rx faxed to [facility]

## 2013-10-31 ENCOUNTER — Ambulatory Visit: Payer: BC Managed Care – PPO | Admitting: Physical Therapy

## 2013-12-27 ENCOUNTER — Ambulatory Visit: Payer: BC Managed Care – PPO | Attending: Internal Medicine | Admitting: Physical Therapy

## 2013-12-27 DIAGNOSIS — M25519 Pain in unspecified shoulder: Secondary | ICD-10-CM | POA: Insufficient documentation

## 2013-12-27 DIAGNOSIS — R293 Abnormal posture: Secondary | ICD-10-CM | POA: Insufficient documentation

## 2013-12-27 DIAGNOSIS — IMO0001 Reserved for inherently not codable concepts without codable children: Secondary | ICD-10-CM | POA: Insufficient documentation

## 2014-01-25 ENCOUNTER — Telehealth: Payer: Self-pay

## 2014-01-25 NOTE — Telephone Encounter (Signed)
Pt last seen in December, pt need an appointment, jk/cma

## 2014-01-25 NOTE — Telephone Encounter (Signed)
The patient called hoping to get a refill of her pain med, hydrocodone   Callback - [phone]

## 2014-01-25 NOTE — Telephone Encounter (Signed)
Called patient, she stated she would look at her calendar and call back to schedule an apt

## 2014-02-22 ENCOUNTER — Ambulatory Visit (INDEPENDENT_AMBULATORY_CARE_PROVIDER_SITE_OTHER): Payer: BC Managed Care – PPO | Admitting: Internal Medicine

## 2014-02-22 ENCOUNTER — Ambulatory Visit (INDEPENDENT_AMBULATORY_CARE_PROVIDER_SITE_OTHER): Payer: BC Managed Care – PPO | Admitting: Family Medicine

## 2014-02-22 ENCOUNTER — Encounter: Payer: Self-pay | Admitting: Internal Medicine

## 2014-02-22 ENCOUNTER — Encounter: Payer: Self-pay | Admitting: Family Medicine

## 2014-02-22 ENCOUNTER — Other Ambulatory Visit (INDEPENDENT_AMBULATORY_CARE_PROVIDER_SITE_OTHER): Payer: BC Managed Care – PPO

## 2014-02-22 VITALS — BP 120/78 | HR 73

## 2014-02-22 VITALS — BP 112/60 | HR 60 | Temp 97.9°F | Resp 16 | Ht 64.0 in | Wt 142.0 lb

## 2014-02-22 DIAGNOSIS — IMO0001 Reserved for inherently not codable concepts without codable children: Secondary | ICD-10-CM

## 2014-02-22 DIAGNOSIS — M5412 Radiculopathy, cervical region: Secondary | ICD-10-CM

## 2014-02-22 DIAGNOSIS — I10 Essential (primary) hypertension: Secondary | ICD-10-CM

## 2014-02-22 DIAGNOSIS — M25519 Pain in unspecified shoulder: Secondary | ICD-10-CM

## 2014-02-22 DIAGNOSIS — M67919 Unspecified disorder of synovium and tendon, unspecified shoulder: Secondary | ICD-10-CM

## 2014-02-22 DIAGNOSIS — E785 Hyperlipidemia, unspecified: Secondary | ICD-10-CM

## 2014-02-22 DIAGNOSIS — J3089 Other allergic rhinitis: Secondary | ICD-10-CM

## 2014-02-22 DIAGNOSIS — M719 Bursopathy, unspecified: Secondary | ICD-10-CM

## 2014-02-22 DIAGNOSIS — M25511 Pain in right shoulder: Secondary | ICD-10-CM

## 2014-02-22 MED ORDER — MOMETASONE FUROATE 50 MCG/ACT NA SUSP: 4.0000 | Freq: Every day | NASAL | Status: DC

## 2014-02-22 MED ORDER — HYDROCODONE-ACETAMINOPHEN 5-325 MG PO TABS: 1.0000 | ORAL_TABLET | Freq: Four times a day (QID) | ORAL | Status: DC | PRN

## 2014-02-22 NOTE — Progress Notes (Signed)
Pre visit review using our clinic review tool, if applicable. No additional management support is needed unless otherwise documented below in the visit note. 

## 2014-02-22 NOTE — Assessment & Plan Note (Signed)
Patient had another injection again. Patient did formal physical therapy and learned a lot of exercises. Encourage her to do them on a regular basis. Discussed over-the-counter medications and to be more beneficial as well. There still some concern that some of this could be from her neck the patient responding well to intra-articular injections. Patient can continue to follow up every 3-4 months as needed for injections.

## 2014-02-22 NOTE — Patient Instructions (Signed)
Always good to see you Still do exercises 3 times a week at least.  Ice after activity.  Come see me when you need me. Have the cabana boy carry your bags.

## 2014-02-22 NOTE — Progress Notes (Signed)
CC: Shoulder and neck pain  HPI: Patient is a 68 year old female with a three-month history of right shoulder pain. This has been a recurrent problem. Patient had a corticosteroid injection 4 months ago and states that the pain had completely resolved but the last 2 weeks. Patient started to have the same dull aching sensation with mild radiation down the right arm. Patient states it is becoming uncomfortable at night. Patient is has a trip coming up in one week and would like to be pain free. Patient denies any true injury. And denies any new symptoms.  Past medical, surgical, family and social history reviewed. Medications reviewed all in the electronic medical record.   Review of Systems: No headache, visual changes, nausea, vomiting, diarrhea, constipation, dizziness, abdominal pain, skin rash, fevers, chills, night sweats, weight loss, swollen lymph nodes, body aches, joint swelling, muscle aches, chest pain, shortness of breath, mood changes.   Objective:    Blood pressure 120/78, pulse 73, SpO2 95.00%.    General: No apparent distress alert and oriented x3 mood and affect normal, dressed appropriately.  HEENT: Pupils equal, extraocular movements intact Respiratory: Patient's speak in full sentences and does not appear short of breath Cardiovascular: No lower extremity edema, non tender, no erythema Skin: Warm dry intact with no signs of infection or rash on extremities or on axial skeleton. Abdomen: Soft nontender Neuro: Cranial nerves II through XII are intact, neurovascularly intact in all extremities with 2+ DTRs and 2+ pulses. Lymph: No lymphadenopathy of posterior or anterior cervical chain or axillae bilaterally.  Gait normal with good balance and coordination.  MSK: Non tender with full range of motion and good stability and symmetric strength and tone of shoulders, elbows, wrist, hip, knee and ankles bilaterally.  Neck: Inspection unremarkable. No palpable  stepoffs. Negative Spurling's maneuver. I a limitation of motion with left side bending and left rotation. Grip strength and sensation normal in bilateral hands Strength good C4 to T1 distribution No sensory change to C4 to T1 Negative Hoffman sign bilaterally Reflexes normal  Shoulder: Right Inspection reveals no abnormalities, atrophy or asymmetry. Palpation is normal with no tenderness over AC joint or bicipital groove. ROM is full in all planes. Patient though does have pain when greater than 175 of forward flexion Rotator cuff strength normal throughout. Positive Neer and Hawkin's tests, empty can sign. No change from previous exam Speeds and Yergason's tests normal. No labral pathology noted with negative Obrien's, negative clunk and good stability. Normal scapular function observed. No painful arc and no drop arm sign. No apprehension sign   Procedure: Real-time Ultrasound Guided Injection of right glenohumeral joint Device: GE Logiq E  Ultrasound guided injection is preferred based studies that show increased duration, increased effect, greater accuracy, decreased procedural pain, increased response rate with ultrasound guided versus blind injection.  Verbal informed consent obtained.  Time-out conducted.  Noted no overlying erythema, induration, or other signs of local infection.  Skin prepped in a sterile fashion.  Local anesthesia: Topical Ethyl chloride.  With sterile technique and under real time ultrasound guidance:  Joint visualized.  23g 1  inch needle inserted posterior approach. Pictures taken for needle placement. Patient did have injection of 2 cc of 1% lidocaine, 2 cc of 0.5% Marcaine, and 1.0 cc of Kenalog 40 mg/dL. Completed without difficulty  Pain immediately resolved suggesting accurate placement of the medication.  Advised to call if fevers/chills, erythema, induration, drainage, or persistent bleeding.  Images permanently stored and available for review  in the  ultrasound unit.  Impression: Technically successful ultrasound guided injection.    Impression and Recommendations:     This case required medical decision making of moderate complexity.

## 2014-02-22 NOTE — Assessment & Plan Note (Signed)
Her BP is well controlled I will check her lytes and renal function 

## 2014-02-22 NOTE — Progress Notes (Signed)
Subjective:    Patient ID: Pam Scott, female    DOB: [birth year], 68 y.o.   MRN: [id number]  Hypertension This is a chronic problem. The current episode started more than 1 year ago. The problem has been gradually improving since onset. The problem is controlled. Associated symptoms include neck pain. Pertinent negatives include no anxiety, blurred vision, chest pain, headaches, malaise/fatigue, orthopnea, palpitations, peripheral edema, PND, shortness of breath or sweats. Agents associated with hypertension include estrogens and NSAIDs. Past treatments include diuretics. The current treatment provides moderate improvement. Compliance problems include exercise and diet.       Review of Systems  Constitutional: Negative.  Negative for fever, chills, malaise/fatigue, diaphoresis, appetite change and fatigue.  HENT: Positive for congestion and ear pain (she feels popping and pressure in both ears). Negative for facial swelling, hearing loss, postnasal drip, rhinorrhea, sinus pressure, sneezing, sore throat and trouble swallowing.   Eyes: Negative.  Negative for blurred vision.  Respiratory: Negative.  Negative for cough, choking, chest tightness, shortness of breath, wheezing and stridor.   Cardiovascular: Negative.  Negative for chest pain, palpitations, orthopnea, leg swelling and PND.  Gastrointestinal: Negative.  Negative for nausea, vomiting, abdominal pain, diarrhea, constipation and blood in stool.  Endocrine: Negative.   Genitourinary: Negative.   Musculoskeletal: Positive for arthralgias and neck pain. Negative for back pain, gait problem, joint swelling, myalgias and neck stiffness.  Allergic/Immunologic: Negative.   Neurological: Negative.  Negative for headaches.  Hematological: Negative.  Negative for adenopathy. Does not bruise/bleed easily.  Psychiatric/Behavioral: Negative.        Objective:   Physical Exam  Vitals reviewed. Constitutional: She is oriented to person,  place, and time. She appears well-developed and well-nourished. No distress.  HENT:  Head: Normocephalic and atraumatic.  Right Ear: Hearing, tympanic membrane, external ear and ear canal normal.  Left Ear: Hearing, tympanic membrane, external ear and ear canal normal.  Nose: No mucosal edema or rhinorrhea. Right sinus exhibits no maxillary sinus tenderness and no frontal sinus tenderness. Left sinus exhibits no maxillary sinus tenderness and no frontal sinus tenderness.  Mouth/Throat: Oropharynx is clear and moist and mucous membranes are normal. Mucous membranes are not pale, not dry and not cyanotic. No oral lesions. No trismus in the jaw. No uvula swelling. No oropharyngeal exudate, posterior oropharyngeal edema, posterior oropharyngeal erythema or tonsillar abscesses.  Eyes: Conjunctivae are normal. Right eye exhibits no discharge. Left eye exhibits no discharge. No scleral icterus.  Neck: Normal range of motion. Neck supple. No JVD present. No tracheal deviation present. No thyromegaly present.  Cardiovascular: Normal rate, regular rhythm, normal heart sounds and intact distal pulses.  Exam reveals no gallop and no friction rub.   No murmur heard. Pulmonary/Chest: Effort normal and breath sounds normal. No stridor. No respiratory distress. She has no wheezes. She has no rales. She exhibits no tenderness.  Abdominal: Soft. Bowel sounds are normal. She exhibits no distension and no mass. There is no tenderness. There is no rebound and no guarding.  Musculoskeletal: Normal range of motion. She exhibits no edema and no tenderness.  Lymphadenopathy:    She has no cervical adenopathy.  Neurological: She is oriented to person, place, and time.  Skin: Skin is warm and dry. No rash noted. She is not diaphoretic. No erythema. No pallor.     Lab Results  Component Value Date   WBC 6.1 06/23/2013   HGB 13.5 06/23/2013   HCT 40.0 06/23/2013   PLT 178.0 06/23/2013   GLUCOSE 90  06/23/2013   CHOL 246*  06/23/2013   TRIG 77.0 06/23/2013   HDL 75.50 06/23/2013   LDLDIRECT 159.9 06/23/2013   LDLCALC 77 04/19/2012   ALT 29 06/23/2013   AST 27 06/23/2013   NA 140 06/23/2013   K 3.9 06/23/2013   CL 103 06/23/2013   CREATININE 0.9 06/23/2013   BUN 11 06/23/2013   CO2 30 06/23/2013   TSH 2.15 06/23/2013       Assessment & Plan:

## 2014-02-22 NOTE — Patient Instructions (Signed)

## 2014-02-22 NOTE — Assessment & Plan Note (Signed)
She has some s/s of eustachian tube dysfunction so I have asked her to start nasonex ns

## 2014-02-22 NOTE — Assessment & Plan Note (Signed)
She will cont the current meds for now but I have also asked her to see pain management to consider getting an ESI

## 2014-03-29 ENCOUNTER — Telehealth: Payer: Self-pay

## 2014-03-29 MED ORDER — CITALOPRAM HYDROBROMIDE 20 MG PO TABS: ORAL_TABLET | ORAL | Status: DC

## 2014-03-29 NOTE — Telephone Encounter (Signed)
The pt called and is hoping to get a refill of Celexa   [facility]   Pt callback - [phone]

## 2014-03-30 ENCOUNTER — Other Ambulatory Visit: Payer: Self-pay

## 2014-03-30 DIAGNOSIS — Z1231 Encounter for screening mammogram for malignant neoplasm of breast: Secondary | ICD-10-CM

## 2014-04-04 ENCOUNTER — Ambulatory Visit
Admission: RE | Admit: 2014-04-04 | Discharge: 2014-04-04 | Disposition: A | Discharge status: Home / Self Care | Payer: BC Managed Care – PPO | Source: Ambulatory Visit

## 2014-04-04 DIAGNOSIS — Z1231 Encounter for screening mammogram for malignant neoplasm of breast: Secondary | ICD-10-CM

## 2014-04-05 LAB — HM MAMMOGRAPHY: HM Mammogram: NORMAL

## 2014-06-19 ENCOUNTER — Ambulatory Visit (INDEPENDENT_AMBULATORY_CARE_PROVIDER_SITE_OTHER): Payer: BC Managed Care – PPO | Admitting: Family Medicine

## 2014-06-19 ENCOUNTER — Other Ambulatory Visit (INDEPENDENT_AMBULATORY_CARE_PROVIDER_SITE_OTHER): Payer: BC Managed Care – PPO

## 2014-06-19 ENCOUNTER — Encounter: Payer: Self-pay | Admitting: Family Medicine

## 2014-06-19 VITALS — BP 122/82 | HR 59 | Ht 64.0 in | Wt 143.0 lb

## 2014-06-19 DIAGNOSIS — M719 Bursopathy, unspecified: Secondary | ICD-10-CM

## 2014-06-19 DIAGNOSIS — M67911 Unspecified disorder of synovium and tendon, right shoulder: Secondary | ICD-10-CM

## 2014-06-19 DIAGNOSIS — M25511 Pain in right shoulder: Secondary | ICD-10-CM

## 2014-06-19 DIAGNOSIS — M25519 Pain in unspecified shoulder: Secondary | ICD-10-CM

## 2014-06-19 DIAGNOSIS — M67919 Unspecified disorder of synovium and tendon, unspecified shoulder: Secondary | ICD-10-CM

## 2014-06-19 MED ORDER — DICLOFENAC SODIUM 2 % TD SOLN: TRANSDERMAL | Status: DC

## 2014-06-19 NOTE — Assessment & Plan Note (Signed)
Patient was given another injection today. Patient tolerated the procedure well with near resolution of pain. We discussed the importance of the icing home exercises. We discussed the possibility of formal physical therapy which she declined. We discussed new x-rays which patient declined as well. Patient is limited doing well followup in 4-5 intervals we can always do another corticosteroid injection. We discussed further imaging such as an MRI and the possibility of this being radicular symptoms. Patient was again states that this work so I do anything different. Patient was given a prescription for anti-inflammatories topically to try. Patient will follow up again as needed.  Spent greater than 25 minutes with patient face-to-face and had greater than 50% of counseling including as described above in assessment and plan.

## 2014-06-19 NOTE — Progress Notes (Signed)
CC: Shoulder and neck pain  HPI: Patient is a 68 year old female with a three-month history of right shoulder pain. Patient is right shoulder pain has been along process. Patient does have a rotator cuff disorder. Patient does have x-rays of the neck as well as an MRI of the neck showed cervical spondylosis and degenerative disc diseases. Patient to continue to respond very well to intra-articular injections of the right shoulder. Patient has not been doing home exercises and much and that is why she does she is having more difficulty. It has been 4 months since her last injection. Patient denies any weakness denies any numbness denies any new symptoms is worsening of previous symptoms.  Past medical, surgical, family and social history reviewed. Medications reviewed all in the electronic medical record.   Review of Systems: No headache, visual changes, nausea, vomiting, diarrhea, constipation, dizziness, abdominal pain, skin rash, fevers, chills, night sweats, weight loss, swollen lymph nodes, body aches, joint swelling, muscle aches, chest pain, shortness of breath, mood changes.   Objective:    Blood pressure 122/82, pulse 59, height 5\' 4"  (1.626 m), weight 143 lb (64.864 kg), SpO2 99.00%.    General: No apparent distress alert and oriented x3 mood and affect normal, dressed appropriately.  HEENT: Pupils equal, extraocular movements intact Respiratory: Patient's speak in full sentences and does not appear short of breath Cardiovascular: No lower extremity edema, non tender, no erythema Skin: Warm dry intact with no signs of infection or rash on extremities or on axial skeleton. Abdomen: Soft nontender Neuro: Cranial nerves II through XII are intact, neurovascularly intact in all extremities with 2+ DTRs and 2+ pulses. Lymph: No lymphadenopathy of posterior or anterior cervical chain or axillae bilaterally.  Gait normal with good balance and coordination.  MSK: Non tender with full range of  motion and good stability and symmetric strength and tone of shoulders, elbows, wrist, hip, knee and ankles bilaterally.  Neck: Inspection unremarkable. No palpable stepoffs. Negative Spurling's maneuver. Mild limitation of motion with left side bending and left rotation. Grip strength and sensation normal in bilateral hands Strength good C4 to T1 distribution No sensory change to C4 to T1 Negative Hoffman sign bilaterally Reflexes normal  Shoulder: Right Inspection reveals no abnormalities, atrophy or asymmetry. Palpation is normal with no tenderness over AC joint or bicipital groove. ROM is full in all planes. Patient though does have pain when greater than 175 of forward flexion as well as internal rotation once again full range. Rotator cuff strength normal throughout. Positive Neer and Hawkin's tests, empty can sign. No change from previous exam Speeds and Yergason's tests normal. No labral pathology noted with negative Obrien's, negative clunk and good stability. Normal scapular function observed. No painful arc and no drop arm sign. No apprehension sign   Procedure: Real-time Ultrasound Guided Injection of right glenohumeral joint Device: GE Logiq E  Ultrasound guided injection is preferred based studies that show increased duration, increased effect, greater accuracy, decreased procedural pain, increased response rate with ultrasound guided versus blind injection.  Verbal informed consent obtained.  Time-out conducted.  Noted no overlying erythema, induration, or other signs of local infection.  Skin prepped in a sterile fashion.  Local anesthesia: Topical Ethyl chloride.  With sterile technique and under real time ultrasound guidance:  Joint visualized.  23g 1  inch needle inserted posterior approach. Pictures taken for needle placement. Patient did have injection of 2 cc of 1% lidocaine, 2 cc of 0.5% Marcaine, and 1.0 cc of Kenalog 40  mg/dL. Completed without difficulty   Pain immediately resolved suggesting accurate placement of the medication.  Advised to call if fevers/chills, erythema, induration, drainage, or persistent bleeding.  Images permanently stored and available for review in the ultrasound unit.  Impression: Technically successful ultrasound guided injection.    Impression and Recommendations:     This case required medical decision making of moderate complexity.

## 2014-06-19 NOTE — Patient Instructions (Signed)
Good to see you Ice is your friend Exercises 3 times a day Pennsaid twice daily as needed See me when you need me.

## 2014-06-28 ENCOUNTER — Encounter: Payer: Self-pay | Admitting: Internal Medicine

## 2014-06-28 ENCOUNTER — Other Ambulatory Visit (INDEPENDENT_AMBULATORY_CARE_PROVIDER_SITE_OTHER): Payer: BC Managed Care – PPO

## 2014-06-28 ENCOUNTER — Ambulatory Visit: Payer: BC Managed Care – PPO | Admitting: Family Medicine

## 2014-06-28 ENCOUNTER — Telehealth: Payer: Self-pay | Admitting: Internal Medicine

## 2014-06-28 ENCOUNTER — Ambulatory Visit (INDEPENDENT_AMBULATORY_CARE_PROVIDER_SITE_OTHER): Payer: BC Managed Care – PPO | Admitting: Internal Medicine

## 2014-06-28 VITALS — BP 130/90 | HR 60 | Temp 98.4°F | Resp 16 | Ht 64.0 in | Wt 139.0 lb

## 2014-06-28 DIAGNOSIS — F329 Major depressive disorder, single episode, unspecified: Secondary | ICD-10-CM

## 2014-06-28 DIAGNOSIS — G4700 Insomnia, unspecified: Secondary | ICD-10-CM | POA: Insufficient documentation

## 2014-06-28 DIAGNOSIS — R9431 Abnormal electrocardiogram [ECG] [EKG]: Secondary | ICD-10-CM | POA: Insufficient documentation

## 2014-06-28 DIAGNOSIS — M5412 Radiculopathy, cervical region: Secondary | ICD-10-CM

## 2014-06-28 DIAGNOSIS — M67911 Unspecified disorder of synovium and tendon, right shoulder: Secondary | ICD-10-CM

## 2014-06-28 DIAGNOSIS — I10 Essential (primary) hypertension: Secondary | ICD-10-CM

## 2014-06-28 DIAGNOSIS — E785 Hyperlipidemia, unspecified: Secondary | ICD-10-CM

## 2014-06-28 DIAGNOSIS — R5381 Other malaise: Secondary | ICD-10-CM

## 2014-06-28 DIAGNOSIS — Z0000 Encounter for general adult medical examination without abnormal findings: Secondary | ICD-10-CM

## 2014-06-28 DIAGNOSIS — R5383 Other fatigue: Secondary | ICD-10-CM

## 2014-06-28 DIAGNOSIS — R001 Bradycardia, unspecified: Secondary | ICD-10-CM

## 2014-06-28 DIAGNOSIS — F3289 Other specified depressive episodes: Secondary | ICD-10-CM

## 2014-06-28 DIAGNOSIS — M67919 Unspecified disorder of synovium and tendon, unspecified shoulder: Secondary | ICD-10-CM

## 2014-06-28 DIAGNOSIS — M719 Bursopathy, unspecified: Secondary | ICD-10-CM

## 2014-06-28 DIAGNOSIS — IMO0001 Reserved for inherently not codable concepts without codable children: Secondary | ICD-10-CM

## 2014-06-28 DIAGNOSIS — I498 Other specified cardiac arrhythmias: Secondary | ICD-10-CM

## 2014-06-28 DIAGNOSIS — M810 Age-related osteoporosis without current pathological fracture: Secondary | ICD-10-CM

## 2014-06-28 LAB — LIPID PANEL
Cholesterol: 280 mg/dL — ABNORMAL HIGH (ref 0–200)
HDL: 97.8 mg/dL (ref >39.00)
LDL Cholesterol: 166 mg/dL — ABNORMAL HIGH (ref 0–99)
NONHDL: 182.2
Total CHOL/HDL Ratio: 3
Triglycerides: 80 mg/dL (ref 0.0–149.0)
VLDL: 16 mg/dL (ref 0.0–40.0)

## 2014-06-28 LAB — COMPREHENSIVE METABOLIC PANEL
ALT: 23 U/L (ref 0–35)
AST: 18 U/L (ref 0–37)
Albumin: 4.4 g/dL (ref 3.5–5.2)
Alkaline Phosphatase: 54 U/L (ref 39–117)
BUN: 22 mg/dL (ref 6–23)
CALCIUM: 9.6 mg/dL (ref 8.4–10.5)
CHLORIDE: 100 meq/L (ref 96–112)
CO2: 30 meq/L (ref 19–32)
Creatinine, Ser: 0.9 mg/dL (ref 0.4–1.2)
GFR: 63.23 mL/min (ref >60.00)
GLUCOSE: 87 mg/dL (ref 70–99)
POTASSIUM: 4 meq/L (ref 3.5–5.1)
SODIUM: 138 meq/L (ref 135–145)
TOTAL PROTEIN: 7.6 g/dL (ref 6.0–8.3)
Total Bilirubin: 1.2 mg/dL (ref 0.2–1.2)

## 2014-06-28 LAB — TSH: TSH: 2.44 u[IU]/mL (ref 0.35–4.50)

## 2014-06-28 LAB — CBC WITH DIFFERENTIAL/PLATELET
Basophils Absolute: 0 10*3/uL (ref 0.0–0.1)
Basophils Relative: 0.4 % (ref 0.0–3.0)
EOS PCT: 1.1 % (ref 0.0–5.0)
Eosinophils Absolute: 0.1 10*3/uL (ref 0.0–0.7)
HCT: 43.5 % (ref 36.0–46.0)
Hemoglobin: 14.6 g/dL (ref 12.0–15.0)
LYMPHS PCT: 26.6 % (ref 12.0–46.0)
Lymphs Abs: 2.2 10*3/uL (ref 0.7–4.0)
MCHC: 33.6 g/dL (ref 30.0–36.0)
MCV: 93 fl (ref 78.0–100.0)
MONOS PCT: 9.5 % (ref 3.0–12.0)
Monocytes Absolute: 0.8 10*3/uL (ref 0.1–1.0)
NEUTROS PCT: 62.4 % (ref 43.0–77.0)
Neutro Abs: 5.2 10*3/uL (ref 1.4–7.7)
PLATELETS: 222 10*3/uL (ref 150.0–400.0)
RBC: 4.67 Mil/uL (ref 3.87–5.11)
RDW: 13.8 % (ref 11.5–15.5)
WBC: 8.3 10*3/uL (ref 4.0–10.5)

## 2014-06-28 MED ORDER — HYDROCODONE-ACETAMINOPHEN 5-325 MG PO TABS: 1.0000 | ORAL_TABLET | Freq: Four times a day (QID) | ORAL | Status: DC | PRN

## 2014-06-28 MED ORDER — DOXEPIN HCL 3 MG PO TABS: 1.0000 | ORAL_TABLET | Freq: Every evening | ORAL | Status: DC | PRN

## 2014-06-28 NOTE — Progress Notes (Signed)
Pre visit review using our clinic review tool, if applicable. No additional management support is needed unless otherwise documented below in the visit note. 

## 2014-06-28 NOTE — Patient Instructions (Signed)
Bradycardia °Bradycardia is a term for a heart rate (pulse) that, in adults, is slower than 60 beats per minute. A normal rate is 60 to 100 beats per minute. A heart rate below 60 beats per minute may be normal for some adults with healthy hearts. If the rate is too slow, the heart may have trouble pumping the volume of blood the body needs. If the heart rate gets too low, blood flow to the brain may be decreased and may make you feel lightheaded, dizzy, or faint. °The heart has a natural pacemaker in the top of the heart called the SA node (sinoatrial or sinus node). This pacemaker sends out regular electrical signals to the muscle of the heart, telling the heart muscle when to beat (contract). The electrical signal travels from the upper parts of the heart (atria) through the AV node (atrioventricular node), to the lower chambers of the heart (ventricles). The ventricles squeeze, pumping the blood from your heart to your lungs and to the rest of your body. °CAUSES  °· Problem with the heart's electrical system. °· Problem with the heart's natural pacemaker. °· Heart disease, damage, or infection. °· Medications. °· Problems with minerals and salts (electrolytes). °SYMPTOMS  °· Fainting (syncope). °· Fatigue and weakness. °· Shortness of breath (dyspnea). °· Chest pain (angina). °· Drowsiness. °· Confusion. °DIAGNOSIS  °· An electrocardiogram (ECG) can help your caregiver determine the type of slow heart rate you have. °· If the cause is not seen on an ECG, you may need to wear a heart monitor that records your heart rhythm for several hours or days. °· Blood tests. °TREATMENT  °· Electrolyte supplements. °· Medications. °· Withholding medication which is causing a slow heart rate. °· Pacemaker placement. °SEEK IMMEDIATE MEDICAL CARE IF:  °· You feel lightheaded or faint. °· You develop an irregular heart rate. °· You feel chest pain or have trouble breathing. °MAKE SURE YOU:  °· Understand these  instructions. °· Will watch your condition. °· Will get help right away if you are not doing well or get worse. °Document Released: 07/26/2002 Document Revised: 01/26/2012 Document Reviewed: 02/08/2014 °[employer]® Patient Information ©2015 [employer]. This information is not intended to replace advice given to you by your health care provider. Make sure you discuss any questions you have with your health care provider. ° °

## 2014-06-28 NOTE — Progress Notes (Signed)
   Subjective:    Patient ID: Pam Scott, female    DOB: July 05, 1946, 68 y.o.   MRN: [id number]  Hypertension This is a chronic problem. The current episode started more than 1 year ago. The problem has been gradually improving since onset. The problem is controlled. Associated symptoms include malaise/fatigue. Pertinent negatives include no anxiety, blurred vision, chest pain, headaches, neck pain, orthopnea, palpitations, peripheral edema, PND, shortness of breath or sweats. Agents associated with hypertension include estrogens. Past treatments include diuretics. The current treatment provides moderate improvement. There are no compliance problems.       Review of Systems  Constitutional: Positive for malaise/fatigue and fatigue. Negative for fever, chills, diaphoresis and appetite change.  HENT: Negative.   Eyes: Negative.  Negative for blurred vision.  Respiratory: Negative.  Negative for shortness of breath.   Cardiovascular: Negative.  Negative for chest pain, palpitations, orthopnea, leg swelling and PND.  Gastrointestinal: Negative.  Negative for nausea, vomiting, abdominal pain, diarrhea, constipation and blood in stool.  Endocrine: Negative.   Genitourinary: Negative.   Musculoskeletal: Positive for arthralgias. Negative for back pain, gait problem, joint swelling, myalgias, neck pain and neck stiffness.  Skin: Negative.  Negative for rash.  Allergic/Immunologic: Negative.   Neurological: Negative.  Negative for headaches.  Hematological: Negative.  Negative for adenopathy. Does not bruise/bleed easily.  Psychiatric/Behavioral: Positive for sleep disturbance (FA's). Negative for suicidal ideas, behavioral problems, self-injury and decreased concentration. The patient is not nervous/anxious.        Objective:   Physical Exam  Vitals reviewed. Constitutional: She is oriented to person, place, and time. She appears well-developed and well-nourished. No distress.  HENT:    Head: Normocephalic and atraumatic.  Mouth/Throat: Oropharynx is clear and moist. No oropharyngeal exudate.  Eyes: Conjunctivae are normal. Right eye exhibits no discharge. Left eye exhibits no discharge. No scleral icterus.  Neck: Normal range of motion. Neck supple. No JVD present. No tracheal deviation present. No thyromegaly present.  Cardiovascular: Normal rate, regular rhythm, normal heart sounds and intact distal pulses.  Exam reveals no gallop and no friction rub.   No murmur heard. Pulmonary/Chest: Effort normal and breath sounds normal. No stridor. No respiratory distress. She has no wheezes. She has no rales. She exhibits no tenderness.  Abdominal: Soft. Bowel sounds are normal. She exhibits no distension and no mass. There is no tenderness. There is no rebound and no guarding.  Musculoskeletal: Normal range of motion. She exhibits no edema and no tenderness.  Lymphadenopathy:    She has no cervical adenopathy.  Neurological: She is oriented to person, place, and time.  Skin: Skin is warm and dry. No rash noted. She is not diaphoretic. No erythema. No pallor.     Lab Results  Component Value Date   WBC 6.1 06/23/2013   HGB 13.5 06/23/2013   HCT 40.0 06/23/2013   PLT 178.0 06/23/2013   GLUCOSE 90 06/23/2013   CHOL 246* 06/23/2013   TRIG 77.0 06/23/2013   HDL 75.50 06/23/2013   LDLDIRECT 159.9 06/23/2013   LDLCALC 77 04/19/2012   ALT 29 06/23/2013   AST 27 06/23/2013   NA 140 06/23/2013   K 3.9 06/23/2013   CL 103 06/23/2013   CREATININE 0.9 06/23/2013   BUN 11 06/23/2013   CO2 30 06/23/2013   TSH 2.15 06/23/2013       Assessment & Plan:

## 2014-06-28 NOTE — Telephone Encounter (Signed)
Patient is in lobby requesting a new script for Hydrocodone before she leaves.

## 2014-07-03 NOTE — Assessment & Plan Note (Signed)
She will cont norco as needed for pain 

## 2014-07-03 NOTE — Assessment & Plan Note (Signed)
Her BP is well controlled 

## 2014-07-03 NOTE — Assessment & Plan Note (Signed)
I will check her labs to look for secondary causes of fatigue

## 2014-07-03 NOTE — Assessment & Plan Note (Signed)
She will try silenor for this

## 2014-07-03 NOTE — Assessment & Plan Note (Addendum)
Her EKG shows marked sinus bradycardia, I have ordered an event monitor to see how low her heart rate goes as this may explain of her fatigue and may require intervention I am also concerned about CAD so will get a lexiscan done

## 2014-07-04 ENCOUNTER — Encounter (HOSPITAL_COMMUNITY): Payer: BC Managed Care – PPO

## 2014-07-04 ENCOUNTER — Telehealth: Payer: Self-pay | Admitting: Internal Medicine

## 2014-07-04 DIAGNOSIS — IMO0001 Reserved for inherently not codable concepts without codable children: Secondary | ICD-10-CM

## 2014-07-04 MED ORDER — PREGABALIN 75 MG PO CAPS: 75.0000 mg | ORAL_CAPSULE | Freq: Two times a day (BID) | ORAL | Status: DC

## 2014-07-04 NOTE — Telephone Encounter (Signed)
Pt request refill for Lyrica to be send to [facility]. Please advise.

## 2014-07-05 ENCOUNTER — Telehealth: Payer: Self-pay

## 2014-07-05 NOTE — Telephone Encounter (Signed)
RX for Lyrica called in to pt pharmacy

## 2014-07-18 ENCOUNTER — Encounter (HOSPITAL_COMMUNITY): Payer: BC Managed Care – PPO

## 2014-07-19 ENCOUNTER — Telehealth: Payer: Self-pay | Admitting: Internal Medicine

## 2014-07-19 MED ORDER — ESTRADIOL 0.5 MG PO TABS: ORAL_TABLET | ORAL | Status: DC

## 2014-07-19 NOTE — Telephone Encounter (Signed)
Notified pt rx has been sent.../lmb 

## 2014-07-19 NOTE — Telephone Encounter (Signed)
Pt called in requesting refill on estradiol (ESTRACE) 0.5 MG.  Last filled on 05/23/2013.  [facility] advise if she can refill.    Best number [phone]  Thank You!!

## 2014-07-31 ENCOUNTER — Encounter: Payer: Self-pay | Admitting: *Deleted

## 2014-07-31 DIAGNOSIS — R001 Bradycardia, unspecified: Secondary | ICD-10-CM

## 2014-07-31 DIAGNOSIS — R9431 Abnormal electrocardiogram [ECG] [EKG]: Secondary | ICD-10-CM

## 2014-07-31 NOTE — Progress Notes (Signed)
Patient ID: Pam Scott, female   DOB: February 11, 1946, 68 y.o.   MRN: [id number] E-Cardio 48 hour holter monitor applied to patient.

## 2014-08-01 ENCOUNTER — Encounter: Payer: Self-pay | Admitting: Internal Medicine

## 2014-08-01 ENCOUNTER — Telehealth: Payer: Self-pay

## 2014-08-01 NOTE — Telephone Encounter (Signed)
Received a call from shelly from [facility], you ordered a cardiac event monitor for the pt, and the technician accidentally gave the pt a 48 hr holter monitor.  Shelly called the pt to inform her of the mistake and the pt states that she wants to continue to wear the 48 hr holter monitor.  Shelly instructed the pt that she would call and notify our office of the pt's request.  Do you approve?  Please advise

## 2014-08-01 NOTE — Telephone Encounter (Signed)
I prefer the event monitor are ordered

## 2014-08-01 NOTE — Telephone Encounter (Signed)
LMOM

## 2014-09-01 ENCOUNTER — Encounter: Payer: Self-pay | Admitting: Family Medicine

## 2014-09-01 ENCOUNTER — Ambulatory Visit (INDEPENDENT_AMBULATORY_CARE_PROVIDER_SITE_OTHER): Payer: BC Managed Care – PPO | Admitting: Family Medicine

## 2014-09-01 ENCOUNTER — Other Ambulatory Visit (INDEPENDENT_AMBULATORY_CARE_PROVIDER_SITE_OTHER): Payer: BC Managed Care – PPO

## 2014-09-01 VITALS — BP 116/84 | HR 62 | Ht 64.0 in | Wt 145.0 lb

## 2014-09-01 DIAGNOSIS — M5412 Radiculopathy, cervical region: Secondary | ICD-10-CM

## 2014-09-01 DIAGNOSIS — M25511 Pain in right shoulder: Secondary | ICD-10-CM

## 2014-09-01 DIAGNOSIS — Z634 Disappearance and death of family member: Secondary | ICD-10-CM

## 2014-09-01 DIAGNOSIS — M67911 Unspecified disorder of synovium and tendon, right shoulder: Secondary | ICD-10-CM

## 2014-09-01 DIAGNOSIS — F4321 Adjustment disorder with depressed mood: Secondary | ICD-10-CM

## 2014-09-01 MED ORDER — HYDROXYZINE HCL 10 MG PO TABS: 10.0000 mg | ORAL_TABLET | Freq: Three times a day (TID) | ORAL | Status: DC | PRN

## 2014-09-01 NOTE — Assessment & Plan Note (Signed)
Discussed with patient at great length. Discussed that we are here for her. Patient was given some hydroxyzine for any type of anxiety release. Patient did not want to take any medications daily. Discussed the patient would like to talk to someone and she declined any therapy at this time. Patient will come back in 3 weeks to make sure she is doing relatively well.

## 2014-09-01 NOTE — Patient Instructions (Signed)
So sorry for your loss.  Ice is your friend.  Exercises still  Call me next week and if not better we will do injection in your neck.

## 2014-09-01 NOTE — Progress Notes (Signed)
CC: Shoulder and neck pain  HPI: Patient is a 68 year old female with a three-month history of right shoulder pain. Patient is right shoulder pain has been along process. Patient does have a rotator cuff disorder. Patient does have x-rays of the neck as well as an MRI of the neck showed cervical spondylosis and degenerative disc diseases. Patient to continue to respond very well to intra-articular injections of the right shoulder. Patient's last injection into the right shoulder was 9 weeks ago. Patient stating that the pain is coming back at this time and is as bad as it was previously. Patient is also noticed a mild increase in her neck pain as well.  Patient has recently had a loss and her family where her step son died from a abnormal heart hypoplasia. This was set in. Patient is having some difficulty with anxiety as well. The patient feels that she does have a good support system around her. States that it is more difficult because her husband is not doing so well.  Past medical, surgical, family and social history reviewed. Medications reviewed all in the electronic medical record.   Review of Systems: No headache, visual changes, nausea, vomiting, diarrhea, constipation, dizziness, abdominal pain, skin rash, fevers, chills, night sweats, weight loss, swollen lymph nodes, body aches, joint swelling, muscle aches, chest pain, shortness of breath, mood changes.   Objective:    Blood pressure 116/84, pulse 62, weight 145 lb (65.772 kg), SpO2 97.00%.    General: No apparent distress alert and oriented x3 mood and affect normal, dressed appropriately.  HEENT: Pupils equal, extraocular movements intact Respiratory: Patient's speak in full sentences and does not appear short of breath Cardiovascular: No lower extremity edema, non tender, no erythema Skin: Warm dry intact with no signs of infection or rash on extremities or on axial skeleton. Abdomen: Soft nontender Neuro: Cranial nerves II  through XII are intact, neurovascularly intact in all extremities with 2+ DTRs and 2+ pulses. Lymph: No lymphadenopathy of posterior or anterior cervical chain or axillae bilaterally.  Gait normal with good balance and coordination.  MSK: Non tender with full range of motion and good stability and symmetric strength and tone of shoulders, elbows, wrist, hip, knee and ankles bilaterally.  Neck: Inspection unremarkable. No palpable stepoffs. Positive Spurling's maneuver with right radiculopathy which is a new symptoms from previous exam. Mild limitation of motion with left side bending and left rotation. Grip strength and sensation normal in bilateral hands Strength good C4 to T1 distribution No sensory change to C4 to T1 Negative Hoffman sign bilaterally Reflexes normal  Shoulder: Right Inspection reveals no abnormalities, atrophy or asymmetry. Palpation is normal with no tenderness over AC joint or bicipital groove. ROM is full in all planes. Patient though does have pain when greater than 175 of forward flexion. Rotator cuff strength normal throughout. Positive Neer and Hawkin's tests, empty can sign. No change from previous exam Speeds and Yergason's tests normal. No labral pathology noted with negative Obrien's, negative clunk and good stability. Normal scapular function observed. No painful arc and no drop arm sign. No apprehension sign   Procedure: Real-time Ultrasound Guided Injection of right glenohumeral joint Device: GE Logiq E  Ultrasound guided injection is preferred based studies that show increased duration, increased effect, greater accuracy, decreased procedural pain, increased response rate with ultrasound guided versus blind injection.  Verbal informed consent obtained.  Time-out conducted.  Noted no overlying erythema, induration, or other signs of local infection.  Skin prepped in a sterile  fashion.  Local anesthesia: Topical Ethyl chloride.  With sterile  technique and under real time ultrasound guidance:  Joint visualized.  23g 1  inch needle inserted posterior approach. Pictures taken for needle placement. Patient did have injection of 2 cc of 1% lidocaine, 2 cc of 0.5% Marcaine, and 1.0 cc of Kenalog 40 mg/dL. Completed without difficulty  Pain immediately resolved suggesting accurate placement of the medication.  Advised to call if fevers/chills, erythema, induration, drainage, or persistent bleeding.  Images permanently stored and available for review in the ultrasound unit.  Impression: Technically successful ultrasound guided injection.    Impression and Recommendations:     This case required medical decision making of moderate complexity.

## 2014-09-01 NOTE — Assessment & Plan Note (Signed)
Patient was given another injection today. We will see patient next improvement. Patient will call next week so she does not know what consider that this is more cervical radiculopathy with patient having a positive Spurling status. If that is true we will need to consider doing this epidural steroid injection of the cervical spine. Will discuss that later. Patient encouraged to do home exercises, icing protocol and over-the-counter supplementation.

## 2014-09-01 NOTE — Assessment & Plan Note (Signed)
Patient's symptoms could be secondary to the degenerative changes of the cervical spine. Patient does not make any significant improvement with the shoulder injection she will come back and we will likely order an epidural steroid injection of the cervical spine.

## 2014-09-04 DIAGNOSIS — Z0279 Encounter for issue of other medical certificate: Secondary | ICD-10-CM

## 2014-09-08 ENCOUNTER — Ambulatory Visit (HOSPITAL_COMMUNITY): Payer: BC Managed Care – PPO | Attending: Internal Medicine | Admitting: Radiology

## 2014-09-08 VITALS — BP 199/100 | HR 58 | Ht 64.0 in | Wt 145.0 lb

## 2014-09-08 DIAGNOSIS — R9431 Abnormal electrocardiogram [ECG] [EKG]: Secondary | ICD-10-CM | POA: Diagnosis not present

## 2014-09-08 DIAGNOSIS — R002 Palpitations: Secondary | ICD-10-CM | POA: Insufficient documentation

## 2014-09-08 DIAGNOSIS — R5383 Other fatigue: Secondary | ICD-10-CM | POA: Diagnosis not present

## 2014-09-08 DIAGNOSIS — I10 Essential (primary) hypertension: Secondary | ICD-10-CM | POA: Diagnosis not present

## 2014-09-08 DIAGNOSIS — R5381 Other malaise: Secondary | ICD-10-CM

## 2014-09-08 MED ORDER — TECHNETIUM TC 99M SESTAMIBI GENERIC - CARDIOLITE
11.0000 | Freq: Once | INTRAVENOUS | Status: AC | PRN
  Administered 2014-09-08: 11 via INTRAVENOUS

## 2014-09-08 MED ORDER — TECHNETIUM TC 99M SESTAMIBI GENERIC - CARDIOLITE
33.0000 | Freq: Once | INTRAVENOUS | Status: AC | PRN
  Administered 2014-09-08: 33 via INTRAVENOUS

## 2014-09-08 MED ORDER — REGADENOSON 0.4 MG/5ML IV SOLN
0.4000 mg | Freq: Once | INTRAVENOUS | Status: AC
Start: 2014-09-08 — End: 2014-09-08
  Administered 2014-09-08: 0.4 mg via INTRAVENOUS

## 2014-09-08 NOTE — Progress Notes (Signed)
[facility] [address] [place], [place] [address] [phone]    Cardiology Nuclear Med Study  Pam Scott is a 68 y.o. female     MRN : [id number]     DOB: 1946-07-31  Procedure Date: 09/08/2014  Nuclear Med Background Indication for Stress Test:  Evaluation for Ischemia and Abnormal EKG History:  no prior cardiac hx or testing Cardiac Risk Factors: Hypertension  Symptoms:  Fatigue and Palpitations   Nuclear Pre-Procedure Caffeine/Decaff Intake:  None NPO After: 7:30pm   Lungs:  clear O2 Sat: 95% on room air. IV 0.9% NS with Angio Cath:  20g  IV Site: L Antecubital  IV Started by:  Ileene Hutchinson, EMT-P  Chest Size (in):  38 Cup Size: B  Height: 5\' 4"  (1.626 m)  Weight:  145 lb (65.772 kg)  BMI:  Body mass index is 24.88 kg/(m^2). Tech Comments:  Held HCTZ this am    Nuclear Med Study 1 or 2 day study: 1 day  Stress Test Type:  Treadmill/Lexiscan  Reading MD: Dola Argyle, MD  Order Authorizing Provider:  Scarlette Calico, MD  Resting Radionuclide: Technetium 60m Sestamibi  Resting Radionuclide Dose: 11.0 mCi   Stress Radionuclide:  Technetium 29m Sestamibi  Stress Radionuclide Dose: 33.0 mCi           Stress Protocol Rest HR: 58 Stress HR: 100  Rest BP: 197/100 Stress BP: 139/92  Exercise Time (min): n/a METS: n/a   Predicted Max HR: 154 bpm % Max HR: 64.94 bpm Rate Pressure Product: 19700   Dose of Adenosine (mg):  n/a Dose of Lexiscan: 0.4 mg  Dose of Atropine (mg): n/a Dose of Dobutamine: n/a mcg/kg/min (at max HR)  Stress Test Technologist: Glade Lloyd, BS-ES  Nuclear Technologist:  Earl Many, CNMT     Rest Procedure:  Myocardial perfusion imaging was performed at rest 45 minutes following the intravenous administration of Technetium 72m Sestamibi. Rest ECG: Normal sinus rhythm. Normal EKG.  Stress Procedure:  The patient received IV Lexiscan 0.4 mg over 15-seconds with concurrent low level exercise and then  Technetium 76m Sestamibi was injected at 30-seconds while the patient continued walking one more minute.  Quantitative spect images were obtained after a 45-minute delay.  During the infusion of Lexiscan the patient complained of head feeling funny and lightheadeness.  These symptoms began to resolve in recovery.  Stress ECG: No significant change from baseline ECG  QPS Raw Data Images:  Normal; no motion artifact; normal heart/lung ratio. Stress Images:  Normal uptake in all segments. Rest Images:  Normal uptake in all segments. Subtraction (SDS):  No evidence of ischemia. Transient Ischemic Dilatation (Normal <1.22):  0.99 Lung/Heart Ratio (Normal <0.45):  0.27  Quantitative Gated Spect Images QGS EDV:  86 ml QGS ESV:  40 ml  Impression Exercise Capacity:  Lexiscan with low level exercise. BP Response:  Normal blood pressure response. Clinical Symptoms:  Lightheadedness ECG Impression:  No significant ST segment change suggestive of ischemia. Comparison with Prior Nuclear Study: No images to compare  Overall Impression:  Normal stress nuclear study. There is no scar or ischemia. This is a low-risk scan.  LV Ejection Fraction: 54%.  LV Wall Motion:  Normal Wall Motion.  Dola Argyle, MD

## 2014-09-11 ENCOUNTER — Encounter: Payer: Self-pay | Admitting: Internal Medicine

## 2014-09-13 ENCOUNTER — Telehealth: Payer: Self-pay | Admitting: Internal Medicine

## 2014-09-13 DIAGNOSIS — M5412 Radiculopathy, cervical region: Secondary | ICD-10-CM

## 2014-09-13 DIAGNOSIS — IMO0001 Reserved for inherently not codable concepts without codable children: Secondary | ICD-10-CM

## 2014-09-13 MED ORDER — HYDROCODONE-ACETAMINOPHEN 5-325 MG PO TABS: 1.0000 | ORAL_TABLET | Freq: Four times a day (QID) | ORAL | Status: DC | PRN

## 2014-09-13 NOTE — Telephone Encounter (Signed)
done

## 2014-09-13 NOTE — Telephone Encounter (Signed)
Pt needs refill Hydrocodone. Please call pt when available. [phone]

## 2014-09-14 NOTE — Telephone Encounter (Signed)
Notified pt rx ready for pick-up.../lmb 

## 2014-10-26 ENCOUNTER — Telehealth: Payer: Self-pay | Admitting: *Deleted

## 2014-10-26 DIAGNOSIS — M5412 Radiculopathy, cervical region: Secondary | ICD-10-CM

## 2014-10-26 DIAGNOSIS — IMO0001 Reserved for inherently not codable concepts without codable children: Secondary | ICD-10-CM

## 2014-10-26 MED ORDER — HYDROCODONE-ACETAMINOPHEN 5-325 MG PO TABS: 1.0000 | ORAL_TABLET | Freq: Four times a day (QID) | ORAL | Status: DC | PRN

## 2014-10-26 NOTE — Telephone Encounter (Signed)
Notified pt rx ready for pick-up.../lmb 

## 2014-10-26 NOTE — Telephone Encounter (Signed)
Left msg on triage requesting refill on her hydrocodone. MD is out of office. pls advise...[name]

## 2014-10-26 NOTE — Telephone Encounter (Signed)
Done hardcopy to D

## 2014-11-02 ENCOUNTER — Telehealth: Payer: Self-pay | Admitting: Internal Medicine

## 2014-11-02 NOTE — Telephone Encounter (Signed)
Pt needs BP med - 3 mos supply sent to [facility]

## 2014-11-07 ENCOUNTER — Other Ambulatory Visit: Payer: Self-pay | Admitting: Geriatric Medicine

## 2014-11-07 ENCOUNTER — Other Ambulatory Visit: Payer: Self-pay | Admitting: Internal Medicine

## 2014-11-07 DIAGNOSIS — I10 Essential (primary) hypertension: Secondary | ICD-10-CM

## 2014-11-07 MED ORDER — HYDROCHLOROTHIAZIDE 12.5 MG PO CAPS: 12.5000 mg | ORAL_CAPSULE | Freq: Every day | ORAL | Status: DC

## 2014-11-07 NOTE — Telephone Encounter (Signed)
Pt calling back to check on status of this (see 12/17 note) pt has 1 BP pill.  Also can a prescription be sent to local pharmacy [facility]/ in [place] [phone] pharm#  Pt going out of town 12/24 and needs this. Thank you.

## 2014-11-07 NOTE — Telephone Encounter (Signed)
Sent to pharmacy 

## 2014-12-05 ENCOUNTER — Encounter: Payer: Self-pay | Admitting: Family Medicine

## 2014-12-05 ENCOUNTER — Other Ambulatory Visit (INDEPENDENT_AMBULATORY_CARE_PROVIDER_SITE_OTHER): Payer: BLUE CROSS/BLUE SHIELD

## 2014-12-05 ENCOUNTER — Ambulatory Visit (INDEPENDENT_AMBULATORY_CARE_PROVIDER_SITE_OTHER): Payer: BLUE CROSS/BLUE SHIELD | Admitting: Family Medicine

## 2014-12-05 VITALS — BP 138/86 | HR 78 | Ht 64.0 in | Wt 140.0 lb

## 2014-12-05 DIAGNOSIS — M67911 Unspecified disorder of synovium and tendon, right shoulder: Secondary | ICD-10-CM

## 2014-12-05 DIAGNOSIS — M25511 Pain in right shoulder: Secondary | ICD-10-CM

## 2014-12-05 NOTE — Assessment & Plan Note (Signed)
Patient was given another injection today and did work with the athletic trainer to learn home exercises in greater detail. Encourage patient to continue the topical anti-inflammatories in the over-the-counter medications. I do not think this is likely secondary to her cervical radiculitis. Patient continues to have difficulty we may need to consider an MR arthrogram to evaluate for her labrum. Patient though would like to avoid any type of surgical intervention. Patient will come back in 3 weeks if pain is still there and we'll consider advanced imaging. Patient though continues to do well we can continue to repeat the injections every 3-4 months as needed.

## 2014-12-05 NOTE — Progress Notes (Signed)
CC: Shoulder and neck pain  HPI: Patient is a 69 year old female with a three-month history of right shoulder pain. Patient is right shoulder pain has been along process. Patient does have a rotator cuff disorder. Patient does have x-rays of the neck as well as an MRI of the neck showed cervical spondylosis and degenerative disc diseases. Patient last injection in her shoulder was October  17th 2015. Patient states she is been doing very well recently. Patient states though that started having a dull throbbing aching pain again. Patient denies any numbness or tingling. Patient denies any radiation down the arm. Patient states that this feels like her similar presentation. Patient once again wants to avoid any type of surgical intervention and that is why advance imaging has not been done. States that the injections do seem to help.  Past medical, surgical, family and social history reviewed. Medications reviewed all in the electronic medical record.   Review of Systems: No headache, visual changes, nausea, vomiting, diarrhea, constipation, dizziness, abdominal pain, skin rash, fevers, chills, night sweats, weight loss, swollen lymph nodes, body aches, joint swelling, muscle aches, chest pain, shortness of breath, mood changes.   Objective:    Blood pressure 138/86, pulse 78, height 5\' 4"  (1.626 m), weight 140 lb (63.504 kg), SpO2 98 %.    General: No apparent distress alert and oriented x3 mood and affect normal, dressed appropriately.  HEENT: Pupils equal, extraocular movements intact Respiratory: Patient's speak in full sentences and does not appear short of breath Cardiovascular: No lower extremity edema, non tender, no erythema Skin: Warm dry intact with no signs of infection or rash on extremities or on axial skeleton. Abdomen: Soft nontender Neuro: Cranial nerves II through XII are intact, neurovascularly intact in all extremities with 2+ DTRs and 2+ pulses. Lymph: No lymphadenopathy of  posterior or anterior cervical chain or axillae bilaterally.  Gait normal with good balance and coordination.  MSK: Non tender with full range of motion and good stability and symmetric strength and tone of shoulders, elbows, wrist, hip, knee and ankles bilaterally.  Neck: Inspection unremarkable. No palpable stepoffs. Positive Spurling's maneuver with right radiculopathy which is a new symptoms from previous exam. Mild limitation of motion with left side bending and left rotation. Grip strength and sensation normal in bilateral hands Strength good C4 to T1 distribution No sensory change to C4 to T1 Negative Hoffman sign bilaterally Reflexes normal  Shoulder: Right Inspection reveals no abnormalities, atrophy or asymmetry. Palpation is normal with no tenderness over AC joint or bicipital groove. ROM is full in all planes. . Rotator cuff strength normal throughout. Positive Neer and Hawkin's tests, empty can sign. No change from previous exam Speeds and Yergason's tests normal. Mild positive labral pathology which is new. Normal scapular function observed. No painful arc and no drop arm sign. No apprehension sign   Procedure: Real-time Ultrasound Guided Injection of right glenohumeral joint Device: GE Logiq E  Ultrasound guided injection is preferred based studies that show increased duration, increased effect, greater accuracy, decreased procedural pain, increased response rate with ultrasound guided versus blind injection.  Verbal informed consent obtained.  Time-out conducted.  Noted no overlying erythema, induration, or other signs of local infection.  Skin prepped in a sterile fashion.  Local anesthesia: Topical Ethyl chloride.  With sterile technique and under real time ultrasound guidance:  Joint visualized.  23g 1  inch needle inserted posterior approach. Pictures taken for needle placement. Patient did have injection of 2 cc of 1% lidocaine,  2 cc of 0.5% Marcaine, and 1.0  cc of Kenalog 40 mg/dL. Completed without difficulty  Pain immediately resolved suggesting accurate placement of the medication.  Advised to call if fevers/chills, erythema, induration, drainage, or persistent bleeding.  Images permanently stored and available for review in the ultrasound unit.  Impression: Technically successful ultrasound guided injection.  Procedure note 25956; 15 minutes spent for Therapeutic exercises as stated in above notes.  This included exercises focusing on stretching, strengthening, with significant focus on eccentric aspects.   Proper technique shown and discussed handout in great detail with ATC.  All questions were discussed and answered.      Impression and Recommendations:     This case required medical decision making of moderate complexity.

## 2014-12-05 NOTE — Patient Instructions (Signed)
Good to see you Ice still at night Continue the vitamin s and the pennsaid We gave you a refresher on the exercises See me when you need me.

## 2014-12-19 ENCOUNTER — Ambulatory Visit: Payer: BLUE CROSS/BLUE SHIELD

## 2014-12-26 ENCOUNTER — Telehealth: Payer: Self-pay | Admitting: Internal Medicine

## 2014-12-26 DIAGNOSIS — M5412 Radiculopathy, cervical region: Secondary | ICD-10-CM

## 2014-12-26 DIAGNOSIS — IMO0001 Reserved for inherently not codable concepts without codable children: Secondary | ICD-10-CM

## 2014-12-26 MED ORDER — HYDROCODONE-ACETAMINOPHEN 5-325 MG PO TABS
1.0000 | ORAL_TABLET | Freq: Four times a day (QID) | ORAL | Status: DC | PRN
Start: 2014-12-26 — End: 2015-02-15

## 2014-12-26 NOTE — Telephone Encounter (Signed)
Pt called in requesting refill on her HYDROcodone-acetaminophen (NORCO/VICODIN) 5-325 MG per tablet [[id number]]

## 2014-12-26 NOTE — Telephone Encounter (Signed)
done

## 2014-12-26 NOTE — Telephone Encounter (Signed)
Notified pt rx ready for pick-up.../lmb 

## 2015-01-30 ENCOUNTER — Telehealth: Payer: Self-pay

## 2015-01-30 NOTE — Telephone Encounter (Signed)
LVM for the patient to call the practice to confirm date of flu vaccination or to call the office for an apt to take a flu shot. 

## 2015-02-10 ENCOUNTER — Other Ambulatory Visit: Payer: Self-pay | Admitting: Internal Medicine

## 2015-02-15 ENCOUNTER — Telehealth: Payer: Self-pay | Admitting: Internal Medicine

## 2015-02-15 DIAGNOSIS — M5412 Radiculopathy, cervical region: Secondary | ICD-10-CM

## 2015-02-15 DIAGNOSIS — IMO0001 Reserved for inherently not codable concepts without codable children: Secondary | ICD-10-CM

## 2015-02-15 MED ORDER — HYDROCODONE-ACETAMINOPHEN 5-325 MG PO TABS: 1.0000 | ORAL_TABLET | Freq: Four times a day (QID) | ORAL | Status: DC | PRN

## 2015-02-15 NOTE — Telephone Encounter (Signed)
Left vm for pt to pick this up.

## 2015-02-15 NOTE — Telephone Encounter (Signed)
Pt request refill for HYDROcodone-acetaminophen (NORCO/VICODIN) 5-325 MG. Please call pt back

## 2015-02-15 NOTE — Telephone Encounter (Signed)
done

## 2015-02-21 ENCOUNTER — Encounter: Payer: Self-pay | Admitting: Family Medicine

## 2015-02-21 ENCOUNTER — Ambulatory Visit (INDEPENDENT_AMBULATORY_CARE_PROVIDER_SITE_OTHER)
Admission: RE | Admit: 2015-02-21 | Discharge: 2015-02-21 | Disposition: A | Discharge status: Home / Self Care | Payer: BLUE CROSS/BLUE SHIELD | Source: Ambulatory Visit | Attending: Family Medicine | Admitting: Family Medicine

## 2015-02-21 ENCOUNTER — Other Ambulatory Visit (INDEPENDENT_AMBULATORY_CARE_PROVIDER_SITE_OTHER): Payer: BLUE CROSS/BLUE SHIELD

## 2015-02-21 ENCOUNTER — Ambulatory Visit (INDEPENDENT_AMBULATORY_CARE_PROVIDER_SITE_OTHER): Payer: BLUE CROSS/BLUE SHIELD | Admitting: Family Medicine

## 2015-02-21 VITALS — BP 122/84 | HR 68 | Ht 64.0 in | Wt 150.0 lb

## 2015-02-21 DIAGNOSIS — M67911 Unspecified disorder of synovium and tendon, right shoulder: Secondary | ICD-10-CM

## 2015-02-21 DIAGNOSIS — M25511 Pain in right shoulder: Secondary | ICD-10-CM

## 2015-02-21 NOTE — Assessment & Plan Note (Signed)
Discussed with patient at this time that I do think repeat x-rays necessary. Patient was given an injection with near complete resolution of pain. I do think that further evaluation may be necessary. If no better in 2 weeks will need MRI.   Spent  25 minutes with patient face-to-face and had greater than 50% of counseling including as described above in assessment and plan.

## 2015-02-21 NOTE — Progress Notes (Signed)
Pre visit review using our clinic review tool, if applicable. No additional management support is needed unless otherwise documented below in the visit note. 

## 2015-02-21 NOTE — Progress Notes (Signed)
CC: Shoulder and neck pain  HPI: Patient is a 69 year old female with a three-month history of right shoulder pain. Patient has had this pain intermittently for quite some time. Patient has had injections multiple times over the course last year and a half. Patient did have x-rays in 2014 that showed minimal osteophytic changes. Patient has had workup of cervical neck disease withdifficulty nerve root impingement. Significant arthritis. Patient is taking her pain medication to try to help. Over the course last 2 weeks she has started having increasing pain again in the shoulder. Patient does not know of any activity it seems to bring it on. Patient's last injection was in January which is greater than 3 months ago.  Past medical, surgical, family and social history reviewed. Medications reviewed all in the electronic medical record.   Review of Systems: No headache, visual changes, nausea, vomiting, diarrhea, constipation, dizziness, abdominal pain, skin rash, fevers, chills, night sweats, weight loss, swollen lymph nodes, body aches, joint swelling, muscle aches, chest pain, shortness of breath, mood changes.   Objective:    Blood pressure 122/84, pulse 68, height 5\' 4"  (1.626 m), weight 150 lb (68.04 kg), SpO2 96 %.    General: No apparent distress alert and oriented x3 mood and affect normal, dressed appropriately.  HEENT: Pupils equal, extraocular movements intact Respiratory: Patient's speak in full sentences and does not appear short of breath Cardiovascular: No lower extremity edema, non tender, no erythema Skin: Warm dry intact with no signs of infection or rash on extremities or on axial skeleton. Abdomen: Soft nontender Neuro: Cranial nerves II through XII are intact, neurovascularly intact in all extremities with 2+ DTRs and 2+ pulses. Lymph: No lymphadenopathy of posterior or anterior cervical chain or axillae bilaterally.  Gait normal with good balance and coordination.  MSK: Non  tender with full range of motion and good stability and symmetric strength and tone of shoulders, elbows, wrist, hip, knee and ankles bilaterally.  Neck: Inspection unremarkable. No palpable stepoffs. Positive Spurling's maneuver with right radiculopathy which is a new symptoms from previous exam. No significant limitation in range of motion. Grip strength and sensation normal in bilateral hands Strength good C4 to T1 distribution No sensory change to C4 to T1 Negative Hoffman sign bilaterally Reflexes normal  Shoulder: Right Inspection reveals no abnormalities, atrophy or asymmetry. Palpation is normal with no tenderness over AC joint or bicipital groove. ROM is full in all planes. . Rotator cuff strength normal throughout. Positive Neer and Hawkin's tests, empty can sign. No change from previous exam Speeds and Yergason's tests normal. Mild positive labral pathology which is new. Normal scapular function observed. No painful arc and no drop arm sign. No apprehension sign   Procedure: Real-time Ultrasound Guided Injection of right glenohumeral joint Device: GE Logiq E  Ultrasound guided injection is preferred based studies that show increased duration, increased effect, greater accuracy, decreased procedural pain, increased response rate with ultrasound guided versus blind injection.  Verbal informed consent obtained.  Time-out conducted.  Noted no overlying erythema, induration, or other signs of local infection.  Skin prepped in a sterile fashion.  Local anesthesia: Topical Ethyl chloride.  With sterile technique and under real time ultrasound guidance:  Joint visualized.  23g 1  inch needle inserted posterior approach. Pictures taken for needle placement. Patient did have injection of 2 cc of 1% lidocaine, 2 cc of 0.5% Marcaine, and 1.0 cc of Kenalog 40 mg/dL. Completed without difficulty  Pain immediately resolved suggesting accurate placement of the  medication.  Advised to  call if fevers/chills, erythema, induration, drainage, or persistent bleeding.  Images permanently stored and available for review in the ultrasound unit.  Impression: Technically successful ultrasound guided injection.  Impression and Recommendations:     This case required medical decision making of moderate complexity.

## 2015-02-21 NOTE — Patient Instructions (Addendum)
Good to see you  1/2 tab of HCTZ  For next 2 weeks Xray downstairs Keep staying active and icing after activity Continue the vitamins Call me in 2 weeks and if not better we will get MRI

## 2015-03-12 ENCOUNTER — Ambulatory Visit (INDEPENDENT_AMBULATORY_CARE_PROVIDER_SITE_OTHER): Payer: BLUE CROSS/BLUE SHIELD | Admitting: Internal Medicine

## 2015-03-12 ENCOUNTER — Encounter: Payer: Self-pay | Admitting: Internal Medicine

## 2015-03-12 VITALS — BP 150/88 | HR 84 | Temp 98.5°F | Ht 64.0 in | Wt 144.5 lb

## 2015-03-12 DIAGNOSIS — J3089 Other allergic rhinitis: Secondary | ICD-10-CM

## 2015-03-12 MED ORDER — PREDNISONE 20 MG PO TABS: 20.0000 mg | ORAL_TABLET | Freq: Two times a day (BID) | ORAL | Status: DC

## 2015-03-12 MED ORDER — MONTELUKAST SODIUM 10 MG PO TABS: 10.0000 mg | ORAL_TABLET | Freq: Every day | ORAL | Status: DC

## 2015-03-12 NOTE — Progress Notes (Signed)
   Subjective:    Patient ID: Pam Scott, female    DOB: Jan 19, 1946, 69 y.o.   MRN: [id number]  HPI Her symptoms began 03/08/15 after working in the garden. She developed rhinitis with clear postnasal drainage and associated sore throat. She has subsequently developed chest congestion and nonproductive cough. She has sneezing and itchy eyes. She has pressure in her sinuses and ears but no pain. She believes she's had some wheezing with the nonproductive cough.  Since she moved from [place] 6 years ago she has had one prior episode of such symptoms. She has no history of asthma   Review of Systems She denies definitive frontal sinus or facial pain; nasal purulence; dental pain; otic discharge; or purulent sputum. There's been no shortness of breath with the cough or wheezing.    Objective:   Physical Exam  General appearance:Adequately nourished; no acute distress or increased work of breathing is present.   Lymphatic: No  lymphadenopathy about the head, neck, or axilla .  Eyes: No conjunctival inflammation or lid edema is present. There is no scleral icterus.  Ears:  External ear exam shows no significant lesions or deformities.  Otoscopic examination reveals clear canals, tympanic membranes are intact bilaterally without bulging, retraction, inflammation or discharge.  Nose:  External nasal examination shows no deformity or inflammation. Nasal mucosa are moderately erythematous without lesions or exudates No septal dislocation or deviation.Obstruction to airflow. Slight hyponasal speech present.   Oral exam: Dental hygiene is good; lips and gums are healthy appearing.There is no oropharyngeal erythema or exudate . Mouth breathing for the most part.  Neck:  No deformities, thyromegaly, masses, or tenderness noted.   Supple with full range of motion without pain.   Heart:  Normal rate and regular rhythm. S1 and S2 normal without gallop, murmur, click, rub or other extra sounds.    Lungs:Chest clear to auscultation; no wheezes, rhonchi,rales ,or rubs present.  Extremities:  No cyanosis, edema, or clubbing  noted    Skin: Warm & dry w/o tenting or jaundice. No significant lesions or rash.       Assessment & Plan:  #1 allergic rhinitis See orders & AVS

## 2015-03-12 NOTE — Patient Instructions (Signed)

## 2015-03-12 NOTE — Progress Notes (Signed)
Pre visit review using our clinic review tool, if applicable. No additional management support is needed unless otherwise documented below in the visit note. 

## 2015-04-11 ENCOUNTER — Telehealth: Payer: Self-pay | Admitting: Internal Medicine

## 2015-04-11 NOTE — Telephone Encounter (Signed)
Called pt spoke woth husband gave him md response...lmb

## 2015-04-11 NOTE — Telephone Encounter (Signed)
Pt called in and needs refill on her Hydrocodone   Best number is [phone]  Told her she may need office wasn't sure

## 2015-04-11 NOTE — Telephone Encounter (Signed)
She is overdue for an OV with me

## 2015-04-17 ENCOUNTER — Ambulatory Visit (INDEPENDENT_AMBULATORY_CARE_PROVIDER_SITE_OTHER): Payer: BLUE CROSS/BLUE SHIELD | Admitting: Internal Medicine

## 2015-04-17 ENCOUNTER — Other Ambulatory Visit (INDEPENDENT_AMBULATORY_CARE_PROVIDER_SITE_OTHER): Payer: BLUE CROSS/BLUE SHIELD

## 2015-04-17 ENCOUNTER — Ambulatory Visit: Payer: BLUE CROSS/BLUE SHIELD | Admitting: Internal Medicine

## 2015-04-17 VITALS — BP 138/94 | HR 61 | Temp 98.4°F | Resp 16 | Ht 64.0 in | Wt 144.0 lb

## 2015-04-17 DIAGNOSIS — E785 Hyperlipidemia, unspecified: Secondary | ICD-10-CM

## 2015-04-17 DIAGNOSIS — Z0000 Encounter for general adult medical examination without abnormal findings: Secondary | ICD-10-CM | POA: Diagnosis not present

## 2015-04-17 DIAGNOSIS — M19011 Primary osteoarthritis, right shoulder: Secondary | ICD-10-CM | POA: Diagnosis not present

## 2015-04-17 DIAGNOSIS — I10 Essential (primary) hypertension: Secondary | ICD-10-CM | POA: Diagnosis not present

## 2015-04-17 DIAGNOSIS — M609 Myositis, unspecified: Secondary | ICD-10-CM

## 2015-04-17 DIAGNOSIS — M791 Myalgia: Secondary | ICD-10-CM

## 2015-04-17 DIAGNOSIS — IMO0001 Reserved for inherently not codable concepts without codable children: Secondary | ICD-10-CM

## 2015-04-17 DIAGNOSIS — M5412 Radiculopathy, cervical region: Secondary | ICD-10-CM

## 2015-04-17 LAB — CBC WITH DIFFERENTIAL/PLATELET
BASOS PCT: 0.4 % (ref 0.0–3.0)
Basophils Absolute: 0 10*3/uL (ref 0.0–0.1)
EOS PCT: 1.2 % (ref 0.0–5.0)
Eosinophils Absolute: 0.1 10*3/uL (ref 0.0–0.7)
HEMATOCRIT: 41.2 % (ref 36.0–46.0)
Hemoglobin: 13.8 g/dL (ref 12.0–15.0)
Lymphocytes Relative: 30.8 % (ref 12.0–46.0)
Lymphs Abs: 2.1 10*3/uL (ref 0.7–4.0)
MCHC: 33.6 g/dL (ref 30.0–36.0)
MCV: 91.4 fl (ref 78.0–100.0)
MONO ABS: 0.7 10*3/uL (ref 0.1–1.0)
MONOS PCT: 9.9 % (ref 3.0–12.0)
NEUTROS ABS: 4 10*3/uL (ref 1.4–7.7)
Neutrophils Relative %: 57.7 % (ref 43.0–77.0)
Platelets: 222 10*3/uL (ref 150.0–400.0)
RBC: 4.5 Mil/uL (ref 3.87–5.11)
RDW: 14.3 % (ref 11.5–15.5)
WBC: 6.9 10*3/uL (ref 4.0–10.5)

## 2015-04-17 LAB — COMPREHENSIVE METABOLIC PANEL
ALBUMIN: 4.4 g/dL (ref 3.5–5.2)
ALT: 20 U/L (ref 0–35)
AST: 18 U/L (ref 0–37)
Alkaline Phosphatase: 55 U/L (ref 39–117)
BILIRUBIN TOTAL: 0.5 mg/dL (ref 0.2–1.2)
BUN: 19 mg/dL (ref 6–23)
CALCIUM: 9.5 mg/dL (ref 8.4–10.5)
CO2: 32 meq/L (ref 19–32)
CREATININE: 0.89 mg/dL (ref 0.40–1.20)
Chloride: 101 mEq/L (ref 96–112)
GFR: 67.19 mL/min (ref >60.00)
Glucose, Bld: 97 mg/dL (ref 70–99)
Potassium: 3.7 mEq/L (ref 3.5–5.1)
Sodium: 138 mEq/L (ref 135–145)
TOTAL PROTEIN: 7.3 g/dL (ref 6.0–8.3)

## 2015-04-17 LAB — LIPID PANEL
Cholesterol: 274 mg/dL — ABNORMAL HIGH (ref 0–200)
HDL: 88 mg/dL (ref >39.00)
LDL Cholesterol: 171 mg/dL — ABNORMAL HIGH (ref 0–99)
NONHDL: 186
Total CHOL/HDL Ratio: 3
Triglycerides: 77 mg/dL (ref 0.0–149.0)
VLDL: 15.4 mg/dL (ref 0.0–40.0)

## 2015-04-17 LAB — TSH: TSH: 1.42 u[IU]/mL (ref 0.35–4.50)

## 2015-04-17 MED ORDER — HYDROCODONE-ACETAMINOPHEN 5-325 MG PO TABS: 1.0000 | ORAL_TABLET | Freq: Four times a day (QID) | ORAL | Status: DC | PRN

## 2015-04-17 NOTE — Progress Notes (Signed)
Pre visit review using our clinic review tool, if applicable. No additional management support is needed unless otherwise documented below in the visit note. 

## 2015-04-17 NOTE — Patient Instructions (Signed)
Preventive Care for Adults A healthy lifestyle and preventive care can promote health and wellness. Preventive health guidelines for women include the following key practices.  A routine yearly physical is a good way to check with your health care provider about your health and preventive screening. It is a chance to share any concerns and updates on your health and to receive a thorough exam.  Visit your dentist for a routine exam and preventive care every 6 months. Brush your teeth twice a day and floss once a day. Good oral hygiene prevents tooth decay and gum disease.  The frequency of eye exams is based on your age, health, family medical history, use of contact lenses, and other factors. Follow your health care provider's recommendations for frequency of eye exams.  Eat a healthy diet. Foods like vegetables, fruits, whole grains, low-fat dairy products, and lean protein foods contain the nutrients you need without too many calories. Decrease your intake of foods high in solid fats, added sugars, and salt. Eat the right amount of calories for you.Get information about a proper diet from your health care provider, if necessary.  Regular physical exercise is one of the most important things you can do for your health. Most adults should get at least 150 minutes of moderate-intensity exercise (any activity that increases your heart rate and causes you to sweat) each week. In addition, most adults need muscle-strengthening exercises on 2 or more days a week.  Maintain a healthy weight. The body mass index (BMI) is a screening tool to identify possible weight problems. It provides an estimate of body fat based on height and weight. Your health care provider can find your BMI and can help you achieve or maintain a healthy weight.For adults 20 years and older:  A BMI below 18.5 is considered underweight.  A BMI of 18.5 to 24.9 is normal.  A BMI of 25 to 29.9 is considered overweight.  A BMI of  30 and above is considered obese.  Maintain normal blood lipids and cholesterol levels by exercising and minimizing your intake of saturated fat. Eat a balanced diet with plenty of fruit and vegetables. Blood tests for lipids and cholesterol should begin at age 76 and be repeated every 5 years. If your lipid or cholesterol levels are high, you are over 50, or you are at high risk for heart disease, you may need your cholesterol levels checked more frequently.Ongoing high lipid and cholesterol levels should be treated with medicines if diet and exercise are not working.  If you smoke, find out from your health care provider how to quit. If you do not use tobacco, do not start.  Lung cancer screening is recommended for adults aged 22-80 years who are at high risk for developing lung cancer because of a history of smoking. A yearly low-dose CT scan of the lungs is recommended for people who have at least a 30-pack-year history of smoking and are a current smoker or have quit within the past 15 years. A pack year of smoking is smoking an average of 1 pack of cigarettes a day for 1 year (for example: 1 pack a day for 30 years or 2 packs a day for 15 years). Yearly screening should continue until the smoker has stopped smoking for at least 15 years. Yearly screening should be stopped for people who develop a health problem that would prevent them from having lung cancer treatment.  If you are pregnant, do not drink alcohol. If you are breastfeeding,  be very cautious about drinking alcohol. If you are not pregnant and choose to drink alcohol, do not have more than 1 drink per day. One drink is considered to be 12 ounces (355 mL) of beer, 5 ounces (148 mL) of wine, or 1.5 ounces (44 mL) of liquor.  Avoid use of street drugs. Do not share needles with anyone. Ask for help if you need support or instructions about stopping the use of drugs.  High blood pressure causes heart disease and increases the risk of  stroke. Your blood pressure should be checked at least every 1 to 2 years. Ongoing high blood pressure should be treated with medicines if weight loss and exercise do not work.  If you are 75-52 years old, ask your health care provider if you should take aspirin to prevent strokes.  Diabetes screening involves taking a blood sample to check your fasting blood sugar level. This should be done once every 3 years, after age 15, if you are within normal weight and without risk factors for diabetes. Testing should be considered at a younger age or be carried out more frequently if you are overweight and have at least 1 risk factor for diabetes.  Breast cancer screening is essential preventive care for women. You should practice "breast self-awareness." This means understanding the normal appearance and feel of your breasts and may include breast self-examination. Any changes detected, no matter how small, should be reported to a health care provider. Women in their 58s and 30s should have a clinical breast exam (CBE) by a health care provider as part of a regular health exam every 1 to 3 years. After age 16, women should have a CBE every year. Starting at age 53, women should consider having a mammogram (breast X-ray test) every year. Women who have a family history of breast cancer should talk to their health care provider about genetic screening. Women at a high risk of breast cancer should talk to their health care providers about having an MRI and a mammogram every year.  Breast cancer gene (BRCA)-related cancer risk assessment is recommended for women who have family members with BRCA-related cancers. BRCA-related cancers include breast, ovarian, tubal, and peritoneal cancers. Having family members with these cancers may be associated with an increased risk for harmful changes (mutations) in the breast cancer genes BRCA1 and BRCA2. Results of the assessment will determine the need for genetic counseling and  BRCA1 and BRCA2 testing.  Routine pelvic exams to screen for cancer are no longer recommended for nonpregnant women who are considered low risk for cancer of the pelvic organs (ovaries, uterus, and vagina) and who do not have symptoms. Ask your health care provider if a screening pelvic exam is right for you.  If you have had past treatment for cervical cancer or a condition that could lead to cancer, you need Pap tests and screening for cancer for at least 20 years after your treatment. If Pap tests have been discontinued, your risk factors (such as having a new sexual partner) need to be reassessed to determine if screening should be resumed. Some women have medical problems that increase the chance of getting cervical cancer. In these cases, your health care provider may recommend more frequent screening and Pap tests.  The HPV test is an additional test that may be used for cervical cancer screening. The HPV test looks for the virus that can cause the cell changes on the cervix. The cells collected during the Pap test can be  tested for HPV. The HPV test could be used to screen women aged 30 years and older, and should be used in women of any age who have unclear Pap test results. After the age of 30, women should have HPV testing at the same frequency as a Pap test.  Colorectal cancer can be detected and often prevented. Most routine colorectal cancer screening begins at the age of 50 years and continues through age 75 years. However, your health care provider may recommend screening at an earlier age if you have risk factors for colon cancer. On a yearly basis, your health care provider may provide home test kits to check for hidden blood in the stool. Use of a small camera at the end of a tube, to directly examine the colon (sigmoidoscopy or colonoscopy), can detect the earliest forms of colorectal cancer. Talk to your health care provider about this at age 50, when routine screening begins. Direct  exam of the colon should be repeated every 5-10 years through age 75 years, unless early forms of pre-cancerous polyps or small growths are found.  People who are at an increased risk for hepatitis B should be screened for this virus. You are considered at high risk for hepatitis B if:  You were born in a country where hepatitis B occurs often. Talk with your health care provider about which countries are considered high risk.  Your parents were born in a high-risk country and you have not received a shot to protect against hepatitis B (hepatitis B vaccine).  You have HIV or AIDS.  You use needles to inject street drugs.  You live with, or have sex with, someone who has hepatitis B.  You get hemodialysis treatment.  You take certain medicines for conditions like cancer, organ transplantation, and autoimmune conditions.  Hepatitis C blood testing is recommended for all people born from 1945 through 1965 and any individual with known risks for hepatitis C.  Practice safe sex. Use condoms and avoid high-risk sexual practices to reduce the spread of sexually transmitted infections (STIs). STIs include gonorrhea, chlamydia, syphilis, trichomonas, herpes, HPV, and human immunodeficiency virus (HIV). Herpes, HIV, and HPV are viral illnesses that have no cure. They can result in disability, cancer, and death.  You should be screened for sexually transmitted illnesses (STIs) including gonorrhea and chlamydia if:  You are sexually active and are younger than 24 years.  You are older than 24 years and your health care provider tells you that you are at risk for this type of infection.  Your sexual activity has changed since you were last screened and you are at an increased risk for chlamydia or gonorrhea. Ask your health care provider if you are at risk.  If you are at risk of being infected with HIV, it is recommended that you take a prescription medicine daily to prevent HIV infection. This is  called preexposure prophylaxis (PrEP). You are considered at risk if:  You are a heterosexual woman, are sexually active, and are at increased risk for HIV infection.  You take drugs by injection.  You are sexually active with a partner who has HIV.  Talk with your health care provider about whether you are at high risk of being infected with HIV. If you choose to begin PrEP, you should first be tested for HIV. You should then be tested every 3 months for as long as you are taking PrEP.  Osteoporosis is a disease in which the bones lose minerals and strength   with aging. This can result in serious bone fractures or breaks. The risk of osteoporosis can be identified using a bone density scan. Women ages 65 years and over and women at risk for fractures or osteoporosis should discuss screening with their health care providers. Ask your health care provider whether you should take a calcium supplement or vitamin D to reduce the rate of osteoporosis.  Menopause can be associated with physical symptoms and risks. Hormone replacement therapy is available to decrease symptoms and risks. You should talk to your health care provider about whether hormone replacement therapy is right for you.  Use sunscreen. Apply sunscreen liberally and repeatedly throughout the day. You should seek shade when your shadow is shorter than you. Protect yourself by wearing long sleeves, pants, a wide-brimmed hat, and sunglasses year round, whenever you are outdoors.  Once a month, do a whole body skin exam, using a mirror to look at the skin on your back. Tell your health care provider of new moles, moles that have irregular borders, moles that are larger than a pencil eraser, or moles that have changed in shape or color.  Stay current with required vaccines (immunizations).  Influenza vaccine. All adults should be immunized every year.  Tetanus, diphtheria, and acellular pertussis (Td, Tdap) vaccine. Pregnant women should  receive 1 dose of Tdap vaccine during each pregnancy. The dose should be obtained regardless of the length of time since the last dose. Immunization is preferred during the 27th-36th week of gestation. An adult who has not previously received Tdap or who does not know her vaccine status should receive 1 dose of Tdap. This initial dose should be followed by tetanus and diphtheria toxoids (Td) booster doses every 10 years. Adults with an unknown or incomplete history of completing a 3-dose immunization series with Td-containing vaccines should begin or complete a primary immunization series including a Tdap dose. Adults should receive a Td booster every 10 years.  Varicella vaccine. An adult without evidence of immunity to varicella should receive 2 doses or a second dose if she has previously received 1 dose. Pregnant females who do not have evidence of immunity should receive the first dose after pregnancy. This first dose should be obtained before leaving the health care facility. The second dose should be obtained 4-8 weeks after the first dose.  Human papillomavirus (HPV) vaccine. Females aged 13-26 years who have not received the vaccine previously should obtain the 3-dose series. The vaccine is not recommended for use in pregnant females. However, pregnancy testing is not needed before receiving a dose. If a female is found to be pregnant after receiving a dose, no treatment is needed. In that case, the remaining doses should be delayed until after the pregnancy. Immunization is recommended for any person with an immunocompromised condition through the age of 26 years if she did not get any or all doses earlier. During the 3-dose series, the second dose should be obtained 4-8 weeks after the first dose. The third dose should be obtained 24 weeks after the first dose and 16 weeks after the second dose.  Zoster vaccine. One dose is recommended for adults aged 60 years or older unless certain conditions are  present.  Measles, mumps, and rubella (MMR) vaccine. Adults born before 1957 generally are considered immune to measles and mumps. Adults born in 1957 or later should have 1 or more doses of MMR vaccine unless there is a contraindication to the vaccine or there is laboratory evidence of immunity to   each of the three diseases. A routine second dose of MMR vaccine should be obtained at least 28 days after the first dose for students attending postsecondary schools, health care workers, or international travelers. People who received inactivated measles vaccine or an unknown type of measles vaccine during 1963-1967 should receive 2 doses of MMR vaccine. People who received inactivated mumps vaccine or an unknown type of mumps vaccine before 1979 and are at high risk for mumps infection should consider immunization with 2 doses of MMR vaccine. For females of childbearing age, rubella immunity should be determined. If there is no evidence of immunity, females who are not pregnant should be vaccinated. If there is no evidence of immunity, females who are pregnant should delay immunization until after pregnancy. Unvaccinated health care workers born before 1957 who lack laboratory evidence of measles, mumps, or rubella immunity or laboratory confirmation of disease should consider measles and mumps immunization with 2 doses of MMR vaccine or rubella immunization with 1 dose of MMR vaccine.  Pneumococcal 13-valent conjugate (PCV13) vaccine. When indicated, a person who is uncertain of her immunization history and has no record of immunization should receive the PCV13 vaccine. An adult aged 19 years or older who has certain medical conditions and has not been previously immunized should receive 1 dose of PCV13 vaccine. This PCV13 should be followed with a dose of pneumococcal polysaccharide (PPSV23) vaccine. The PPSV23 vaccine dose should be obtained at least 8 weeks after the dose of PCV13 vaccine. An adult aged 19  years or older who has certain medical conditions and previously received 1 or more doses of PPSV23 vaccine should receive 1 dose of PCV13. The PCV13 vaccine dose should be obtained 1 or more years after the last PPSV23 vaccine dose.  Pneumococcal polysaccharide (PPSV23) vaccine. When PCV13 is also indicated, PCV13 should be obtained first. All adults aged 65 years and older should be immunized. An adult younger than age 65 years who has certain medical conditions should be immunized. Any person who resides in a nursing home or long-term care facility should be immunized. An adult smoker should be immunized. People with an immunocompromised condition and certain other conditions should receive both PCV13 and PPSV23 vaccines. People with human immunodeficiency virus (HIV) infection should be immunized as soon as possible after diagnosis. Immunization during chemotherapy or radiation therapy should be avoided. Routine use of PPSV23 vaccine is not recommended for American Indians, [place] Natives, or people younger than 65 years unless there are medical conditions that require PPSV23 vaccine. When indicated, people who have unknown immunization and have no record of immunization should receive PPSV23 vaccine. One-time revaccination 5 years after the first dose of PPSV23 is recommended for people aged 19-64 years who have chronic kidney failure, nephrotic syndrome, asplenia, or immunocompromised conditions. People who received 1-2 doses of PPSV23 before age 65 years should receive another dose of PPSV23 vaccine at age 65 years or later if at least 5 years have passed since the previous dose. Doses of PPSV23 are not needed for people immunized with PPSV23 at or after age 65 years.  Meningococcal vaccine. Adults with asplenia or persistent complement component deficiencies should receive 2 doses of quadrivalent meningococcal conjugate (MenACWY-D) vaccine. The doses should be obtained at least 2 months apart.  Microbiologists working with certain meningococcal bacteria, military recruits, people at risk during an outbreak, and people who travel to or live in countries with a high rate of meningitis should be immunized. A first-year college student up through age   21 years who is living in a residence hall should receive a dose if she did not receive a dose on or after her 16th birthday. Adults who have certain high-risk conditions should receive one or more doses of vaccine.  Hepatitis A vaccine. Adults who wish to be protected from this disease, have certain high-risk conditions, work with hepatitis A-infected animals, work in hepatitis A research labs, or travel to or work in countries with a high rate of hepatitis A should be immunized. Adults who were previously unvaccinated and who anticipate close contact with an international adoptee during the first 60 days after arrival in the [place] from a country with a high rate of hepatitis A should be immunized.  Hepatitis B vaccine. Adults who wish to be protected from this disease, have certain high-risk conditions, may be exposed to blood or other infectious body fluids, are household contacts or sex partners of hepatitis B positive people, are clients or workers in certain care facilities, or travel to or work in countries with a high rate of hepatitis B should be immunized.  Haemophilus influenzae type b (Hib) vaccine. A previously unvaccinated person with asplenia or sickle cell disease or having a scheduled splenectomy should receive 1 dose of Hib vaccine. Regardless of previous immunization, a recipient of a hematopoietic stem cell transplant should receive a 3-dose series 6-12 months after her successful transplant. Hib vaccine is not recommended for adults with HIV infection. Preventive Services / Frequency Ages 64 to 68 years  Blood pressure check.** / Every 1 to 2 years.  Lipid and cholesterol check.** / Every 5 years beginning at age  22.  Clinical breast exam.** / Every 3 years for women in their 88s and 53s.  BRCA-related cancer risk assessment.** / For women who have family members with a BRCA-related cancer (breast, ovarian, tubal, or peritoneal cancers).  Pap test.** / Every 2 years from ages 90 through 51. Every 3 years starting at age 21 through age 56 or 3 with a history of 3 consecutive normal Pap tests.  HPV screening.** / Every 3 years from ages 24 through ages 1 to 46 with a history of 3 consecutive normal Pap tests.  Hepatitis C blood test.** / For any individual with known risks for hepatitis C.  Skin self-exam. / Monthly.  Influenza vaccine. / Every year.  Tetanus, diphtheria, and acellular pertussis (Tdap, Td) vaccine.** / Consult your health care provider. Pregnant women should receive 1 dose of Tdap vaccine during each pregnancy. 1 dose of Td every 10 years.  Varicella vaccine.** / Consult your health care provider. Pregnant females who do not have evidence of immunity should receive the first dose after pregnancy.  HPV vaccine. / 3 doses over 6 months, if 72 and younger. The vaccine is not recommended for use in pregnant females. However, pregnancy testing is not needed before receiving a dose.  Measles, mumps, rubella (MMR) vaccine.** / You need at least 1 dose of MMR if you were born in 1957 or later. You may also need a 2nd dose. For females of childbearing age, rubella immunity should be determined. If there is no evidence of immunity, females who are not pregnant should be vaccinated. If there is no evidence of immunity, females who are pregnant should delay immunization until after pregnancy.  Pneumococcal 13-valent conjugate (PCV13) vaccine.** / Consult your health care provider.  Pneumococcal polysaccharide (PPSV23) vaccine.** / 1 to 2 doses if you smoke cigarettes or if you have certain conditions.  Meningococcal vaccine.** /  1 dose if you are age 19 to 21 years and a first-year college  student living in a residence hall, or have one of several medical conditions, you need to get vaccinated against meningococcal disease. You may also need additional booster doses.  Hepatitis A vaccine.** / Consult your health care provider.  Hepatitis B vaccine.** / Consult your health care provider.  Haemophilus influenzae type b (Hib) vaccine.** / Consult your health care provider. Ages 40 to 64 years  Blood pressure check.** / Every 1 to 2 years.  Lipid and cholesterol check.** / Every 5 years beginning at age 20 years.  Lung cancer screening. / Every year if you are aged 55-80 years and have a 30-pack-year history of smoking and currently smoke or have quit within the past 15 years. Yearly screening is stopped once you have quit smoking for at least 15 years or develop a health problem that would prevent you from having lung cancer treatment.  Clinical breast exam.** / Every year after age 40 years.  BRCA-related cancer risk assessment.** / For women who have family members with a BRCA-related cancer (breast, ovarian, tubal, or peritoneal cancers).  Mammogram.** / Every year beginning at age 40 years and continuing for as long as you are in good health. Consult with your health care provider.  Pap test.** / Every 3 years starting at age 30 years through age 65 or 70 years with a history of 3 consecutive normal Pap tests.  HPV screening.** / Every 3 years from ages 30 years through ages 65 to 70 years with a history of 3 consecutive normal Pap tests.  Fecal occult blood test (FOBT) of stool. / Every year beginning at age 50 years and continuing until age 75 years. You may not need to do this test if you get a colonoscopy every 10 years.  Flexible sigmoidoscopy or colonoscopy.** / Every 5 years for a flexible sigmoidoscopy or every 10 years for a colonoscopy beginning at age 50 years and continuing until age 75 years.  Hepatitis C blood test.** / For all people born from 1945 through  1965 and any individual with known risks for hepatitis C.  Skin self-exam. / Monthly.  Influenza vaccine. / Every year.  Tetanus, diphtheria, and acellular pertussis (Tdap/Td) vaccine.** / Consult your health care provider. Pregnant women should receive 1 dose of Tdap vaccine during each pregnancy. 1 dose of Td every 10 years.  Varicella vaccine.** / Consult your health care provider. Pregnant females who do not have evidence of immunity should receive the first dose after pregnancy.  Zoster vaccine.** / 1 dose for adults aged 60 years or older.  Measles, mumps, rubella (MMR) vaccine.** / You need at least 1 dose of MMR if you were born in 1957 or later. You may also need a 2nd dose. For females of childbearing age, rubella immunity should be determined. If there is no evidence of immunity, females who are not pregnant should be vaccinated. If there is no evidence of immunity, females who are pregnant should delay immunization until after pregnancy.  Pneumococcal 13-valent conjugate (PCV13) vaccine.** / Consult your health care provider.  Pneumococcal polysaccharide (PPSV23) vaccine.** / 1 to 2 doses if you smoke cigarettes or if you have certain conditions.  Meningococcal vaccine.** / Consult your health care provider.  Hepatitis A vaccine.** / Consult your health care provider.  Hepatitis B vaccine.** / Consult your health care provider.  Haemophilus influenzae type b (Hib) vaccine.** / Consult your health care provider. Ages 65   years and over  Blood pressure check.** / Every 1 to 2 years.  Lipid and cholesterol check.** / Every 5 years beginning at age 22 years.  Lung cancer screening. / Every year if you are aged 73-80 years and have a 30-pack-year history of smoking and currently smoke or have quit within the past 15 years. Yearly screening is stopped once you have quit smoking for at least 15 years or develop a health problem that would prevent you from having lung cancer  treatment.  Clinical breast exam.** / Every year after age 4 years.  BRCA-related cancer risk assessment.** / For women who have family members with a BRCA-related cancer (breast, ovarian, tubal, or peritoneal cancers).  Mammogram.** / Every year beginning at age 40 years and continuing for as long as you are in good health. Consult with your health care provider.  Pap test.** / Every 3 years starting at age 9 years through age 34 or 91 years with 3 consecutive normal Pap tests. Testing can be stopped between 65 and 70 years with 3 consecutive normal Pap tests and no abnormal Pap or HPV tests in the past 10 years.  HPV screening.** / Every 3 years from ages 57 years through ages 64 or 45 years with a history of 3 consecutive normal Pap tests. Testing can be stopped between 65 and 70 years with 3 consecutive normal Pap tests and no abnormal Pap or HPV tests in the past 10 years.  Fecal occult blood test (FOBT) of stool. / Every year beginning at age 15 years and continuing until age 17 years. You may not need to do this test if you get a colonoscopy every 10 years.  Flexible sigmoidoscopy or colonoscopy.** / Every 5 years for a flexible sigmoidoscopy or every 10 years for a colonoscopy beginning at age 86 years and continuing until age 71 years.  Hepatitis C blood test.** / For all people born from [birth year] through 1963 and any individual with known risks for hepatitis C.  Osteoporosis screening.** / A one-time screening for women ages 83 years and over and women at risk for fractures or osteoporosis.  Skin self-exam. / Monthly.  Influenza vaccine. / Every year.  Tetanus, diphtheria, and acellular pertussis (Tdap/Td) vaccine.** / 1 dose of Td every 10 years.  Varicella vaccine.** / Consult your health care provider.  Zoster vaccine.** / 1 dose for adults aged 61 years or older.  Pneumococcal 13-valent conjugate (PCV13) vaccine.** / Consult your health care provider.  Pneumococcal  polysaccharide (PPSV23) vaccine.** / 1 dose for all adults aged 28 years and older.  Meningococcal vaccine.** / Consult your health care provider.  Hepatitis A vaccine.** / Consult your health care provider.  Hepatitis B vaccine.** / Consult your health care provider.  Haemophilus influenzae type b (Hib) vaccine.** / Consult your health care provider. ** Family history and personal history of risk and conditions may change your health care provider's recommendations. Document Released: 12/30/2001 Document Revised: 03/20/2014 Document Reviewed: 03/31/2011 [facility] Patient Information 2015 [place], [place]. This information is not intended to replace advice given to you by your health care provider. Make sure you discuss any questions you have with your health care provider.

## 2015-04-17 NOTE — Progress Notes (Signed)
Subjective:  Patient ID: Pam Scott, female    DOB: 1946-08-15  Age: 69 y.o. MRN: [id number]  CC: Hyperlipidemia; Hypertension; and Annual Exam   HPI Pam Scott presents for a CPX, HTN, and high cholesterol management. She has not taken the HCTZ in several days and her BP is not well controlled. She complains of shoulder and neck pain but otherwise she feels well and offers no other complaints.  Outpatient Prescriptions Prior to Visit  Medication Sig Dispense Refill  . bimatoprost (LUMIGAN) 0.03 % ophthalmic drops 1 drop at bedtime.      . Cholecalciferol (VITAMIN D3) 2000 UNITS TABS Take by mouth.      . citalopram (CELEXA) 20 MG tablet TAKE 1 TABLET ONCE DAILY 90 tablet 3  . Diclofenac Sodium 2 % SOLN Apply twice daily. 112 g 1  . Doxepin HCl (SILENOR) 3 MG TABS Take 1 tablet (3 mg total) by mouth at bedtime as needed. 30 tablet 11  . estradiol (ESTRACE) 0.5 MG tablet TAKE 1 TABLET ONCE DAILY 90 tablet 3  . hydrochlorothiazide (MICROZIDE) 12.5 MG capsule Take 1 capsule (12.5 mg total) by mouth daily. 90 capsule 2  . HYDROcodone-acetaminophen (NORCO/VICODIN) 5-325 MG per tablet Take 1 tablet by mouth every 6 (six) hours as needed. 90 tablet 0  . hydrOXYzine (ATARAX/VISTARIL) 10 MG tablet Take 1 tablet (10 mg total) by mouth 3 (three) times daily as needed. 30 tablet 0  . LYRICA 75 MG capsule TAKE ONE CAPSULE BY MOUTH TWICE A DAY 180 capsule 1  . montelukast (SINGULAIR) 10 MG tablet Take 1 tablet (10 mg total) by mouth at bedtime. 30 tablet 3  . predniSONE (DELTASONE) 20 MG tablet Take 1 tablet (20 mg total) by mouth 2 (two) times daily. 10 tablet 0   No facility-administered medications prior to visit.    ROS Review of Systems  Constitutional: Negative.  Negative for fever, chills, diaphoresis, activity change, appetite change, fatigue and unexpected weight change.  HENT: Negative.   Eyes: Negative.   Respiratory: Negative.  Negative for cough, choking, chest tightness,  shortness of breath and stridor.   Cardiovascular: Negative.  Negative for chest pain, palpitations and leg swelling.  Gastrointestinal: Negative.  Negative for nausea, vomiting, abdominal pain, diarrhea, constipation and blood in stool.  Endocrine: Negative.   Genitourinary: Negative.  Negative for difficulty urinating.  Musculoskeletal: Positive for arthralgias and neck pain. Negative for myalgias, back pain, joint swelling and gait problem.  Skin: Negative.  Negative for rash.  Allergic/Immunologic: Negative.   Neurological: Negative.  Negative for dizziness, tremors, weakness, light-headedness, numbness and headaches.  Hematological: Negative.  Negative for adenopathy. Does not bruise/bleed easily.  Psychiatric/Behavioral: Positive for sleep disturbance. Negative for suicidal ideas, hallucinations, behavioral problems, confusion, self-injury, dysphoric mood, decreased concentration and agitation. The patient is not nervous/anxious and is not hyperactive.     Objective:  BP 138/94 mmHg  Pulse 61  Temp(Src) 98.4 F (36.9 C) (Oral)  Resp 16  Ht 5\' 4"  (1.626 m)  Wt 144 lb (65.318 kg)  BMI 24.71 kg/m2  SpO2 95%  BP Readings from Last 3 Encounters:  04/17/15 138/94  03/12/15 150/88  02/21/15 122/84    Wt Readings from Last 3 Encounters:  04/17/15 144 lb (65.318 kg)  03/12/15 144 lb 8 oz (65.545 kg)  02/21/15 150 lb (68.04 kg)    Physical Exam  Constitutional: She is oriented to person, place, and time. She appears well-developed and well-nourished. No distress.  HENT:  Head: Normocephalic and  atraumatic.  Mouth/Throat: Oropharynx is clear and moist. No oropharyngeal exudate.  Eyes: Conjunctivae are normal. Right eye exhibits no discharge. Left eye exhibits no discharge. No scleral icterus.  Neck: Normal range of motion. Neck supple. No JVD present. No tracheal deviation present. No thyromegaly present.  Cardiovascular: Normal rate, regular rhythm, normal heart sounds and  intact distal pulses.  Exam reveals no gallop and no friction rub.   No murmur heard. Pulmonary/Chest: Effort normal and breath sounds normal. No stridor. No respiratory distress. She has no wheezes. She has no rales. She exhibits no tenderness.  Abdominal: Soft. Bowel sounds are normal. She exhibits no distension and no mass. There is no tenderness. There is no rebound and no guarding.  Musculoskeletal: Normal range of motion. She exhibits no edema or tenderness.       Cervical back: Normal.  Lymphadenopathy:    She has no cervical adenopathy.  Neurological: She is oriented to person, place, and time.  Skin: Skin is warm and dry. No rash noted. She is not diaphoretic. No erythema. No pallor.  Psychiatric: She has a normal mood and affect. Her behavior is normal. Judgment and thought content normal.  Vitals reviewed.   Lab Results  Component Value Date   WBC 6.9 04/17/2015   HGB 13.8 04/17/2015   HCT 41.2 04/17/2015   PLT 222.0 04/17/2015   GLUCOSE 97 04/17/2015   CHOL 274* 04/17/2015   TRIG 77.0 04/17/2015   HDL 88.00 04/17/2015   LDLDIRECT 159.9 06/23/2013   LDLCALC 171* 04/17/2015   ALT 20 04/17/2015   AST 18 04/17/2015   NA 138 04/17/2015   K 3.7 04/17/2015   CL 101 04/17/2015   CREATININE 0.89 04/17/2015   BUN 19 04/17/2015   CO2 32 04/17/2015   TSH 1.42 04/17/2015    Dg Shoulder Right  02/21/2015   CLINICAL DATA:  Chronic atraumatic right shoulder pain.  EXAM: RIGHT SHOULDER - 2+ VIEW  COMPARISON:  Right shoulder series of October 19, 2013  FINDINGS: The bones of the shoulder are adequately mineralized. There is no acute fracture nor dislocation. There is minimal irregularity of the articular surface of the distal clavicle. The subacromial subdeltoid space is normal. The observed portions of the upper right ribs are normal.  IMPRESSION: There is mild osteoarthritic change of the AC joint. There is no acute bony abnormality.   Electronically Signed   By: David  Martinique   On:  02/21/2015 14:49   [place] Extrem Up Right Ltd  02/21/2015   Procedure: Real-time Ultrasound Guided Injection of right glenohumeral  joint Device: GE Logiq E  Ultrasound guided injection is preferred based studies that show increased  duration, increased effect, greater accuracy, decreased procedural pain,  increased response rate with ultrasound guided versus blind injection.  Verbal informed consent obtained.  Time-out conducted.  Noted no overlying erythema, induration, or other signs of local  infection.  Skin prepped in a sterile fashion.  Local anesthesia: Topical Ethyl chloride.  With sterile technique and under real time ultrasound guidance: Joint  visualized. 23g 1  inch needle inserted posterior approach. Pictures  taken for needle placement. Patient did have injection of 2 cc of 1%  lidocaine, 2 cc of 0.5% Marcaine, and 1.0 cc of Kenalog 40 mg/dL. Completed without difficulty  Pain immediately resolved suggesting accurate placement of the medication.   Advised to call if fevers/chills, erythema, induration, drainage, or  persistent bleeding.  Images permanently stored and available for review in the ultrasound unit.  Impression: Technically successful ultrasound guided injection.   Assessment & Plan:   Pam Scott was seen today for hyperlipidemia, hypertension and annual exam.  Diagnoses and all orders for this visit:  Hyperlipidemia with target LDL less than 130 - Framingham risk score is 4% so will not start a statin Orders: -     Lipid panel; Future -     Comprehensive metabolic panel; Future -     TSH; Future  Cervical radiculitis Orders: -     Discontinue: HYDROcodone-acetaminophen (NORCO/VICODIN) 5-325 MG per tablet; Take 1 tablet by mouth every 6 (six) hours as needed. -     Discontinue: HYDROcodone-acetaminophen (NORCO/VICODIN) 5-325 MG per tablet; Take 1 tablet by mouth every 6 (six) hours as needed. -     Discontinue: HYDROcodone-acetaminophen (NORCO/VICODIN) 5-325 MG  per tablet; Take 1 tablet by mouth every 6 (six) hours as needed. -     HYDROcodone-acetaminophen (NORCO/VICODIN) 5-325 MG per tablet; Take 1 tablet by mouth every 6 (six) hours as needed.  Primary osteoarthritis of right shoulder Orders: -     Discontinue: HYDROcodone-acetaminophen (NORCO/VICODIN) 5-325 MG per tablet; Take 1 tablet by mouth every 6 (six) hours as needed. -     Discontinue: HYDROcodone-acetaminophen (NORCO/VICODIN) 5-325 MG per tablet; Take 1 tablet by mouth every 6 (six) hours as needed. -     Discontinue: HYDROcodone-acetaminophen (NORCO/VICODIN) 5-325 MG per tablet; Take 1 tablet by mouth every 6 (six) hours as needed. -     HYDROcodone-acetaminophen (NORCO/VICODIN) 5-325 MG per tablet; Take 1 tablet by mouth every 6 (six) hours as needed. -     Comprehensive metabolic panel; Future -     CBC with Differential/Platelet; Future -     TSH; Future  Essential hypertension, benign - her BP is not well controlled, will restart the HCTZ, will monitor her lytes and renal function today  Myalgia and myositis Orders: -     Discontinue: HYDROcodone-acetaminophen (NORCO/VICODIN) 5-325 MG per tablet; Take 1 tablet by mouth every 6 (six) hours as needed. -     Discontinue: HYDROcodone-acetaminophen (NORCO/VICODIN) 5-325 MG per tablet; Take 1 tablet by mouth every 6 (six) hours as needed. -     Discontinue: HYDROcodone-acetaminophen (NORCO/VICODIN) 5-325 MG per tablet; Take 1 tablet by mouth every 6 (six) hours as needed. -     HYDROcodone-acetaminophen (NORCO/VICODIN) 5-325 MG per tablet; Take 1 tablet by mouth every 6 (six) hours as needed.   I have discontinued Pam Scott's hydrOXYzine, LYRICA, predniSONE, and montelukast. I am also having her maintain her bimatoprost, Vitamin D3, citalopram, Diclofenac Sodium, Doxepin HCl, estradiol, hydrochlorothiazide, and HYDROcodone-acetaminophen.  Meds ordered this encounter  Medications  . DISCONTD: HYDROcodone-acetaminophen  (NORCO/VICODIN) 5-325 MG per tablet    Sig: Take 1 tablet by mouth every 6 (six) hours as needed.    Dispense:  90 tablet    Refill:  0    Refill on or after 04/17/15  . DISCONTD: HYDROcodone-acetaminophen (NORCO/VICODIN) 5-325 MG per tablet    Sig: Take 1 tablet by mouth every 6 (six) hours as needed.    Dispense:  90 tablet    Refill:  0    Refill on or after 05/17/15  . DISCONTD: HYDROcodone-acetaminophen (NORCO/VICODIN) 5-325 MG per tablet    Sig: Take 1 tablet by mouth every 6 (six) hours as needed.    Dispense:  90 tablet    Refill:  0    Refill on or after 06/16/15  . HYDROcodone-acetaminophen (NORCO/VICODIN) 5-325 MG per tablet    Sig:  Take 1 tablet by mouth every 6 (six) hours as needed.    Dispense:  90 tablet    Refill:  0    Refill on or after 07/17/15   See AVS for instructions about healthy living and anticipatory guidance.  Follow-up: Return in about 4 months (around 08/17/2015).  Scarlette Calico, MD

## 2015-04-18 ENCOUNTER — Encounter: Payer: Self-pay | Admitting: Internal Medicine

## 2015-04-18 NOTE — Assessment & Plan Note (Signed)

## 2015-04-19 ENCOUNTER — Other Ambulatory Visit: Payer: Self-pay

## 2015-04-19 DIAGNOSIS — Z1231 Encounter for screening mammogram for malignant neoplasm of breast: Secondary | ICD-10-CM

## 2015-05-07 ENCOUNTER — Encounter: Payer: Self-pay | Admitting: Internal Medicine

## 2015-05-08 ENCOUNTER — Ambulatory Visit
Admission: RE | Admit: 2015-05-08 | Discharge: 2015-05-08 | Disposition: A | Discharge status: Home / Self Care | Payer: BLUE CROSS/BLUE SHIELD | Source: Ambulatory Visit

## 2015-05-08 DIAGNOSIS — Z1231 Encounter for screening mammogram for malignant neoplasm of breast: Secondary | ICD-10-CM

## 2015-05-09 LAB — HM MAMMOGRAPHY: HM MAMMO: NORMAL

## 2015-05-22 ENCOUNTER — Telehealth: Payer: Self-pay | Admitting: Internal Medicine

## 2015-05-22 NOTE — Telephone Encounter (Signed)
Patient would like for you to call concernig a drug screening test.please advise

## 2015-06-19 ENCOUNTER — Ambulatory Visit (INDEPENDENT_AMBULATORY_CARE_PROVIDER_SITE_OTHER): Payer: BLUE CROSS/BLUE SHIELD | Admitting: Internal Medicine

## 2015-06-19 ENCOUNTER — Encounter: Payer: Self-pay | Admitting: Internal Medicine

## 2015-06-19 VITALS — BP 150/96 | HR 71 | Temp 98.6°F | Ht 64.0 in | Wt 147.0 lb

## 2015-06-19 DIAGNOSIS — T63444A Toxic effect of venom of bees, undetermined, initial encounter: Secondary | ICD-10-CM

## 2015-06-19 DIAGNOSIS — T63441A Toxic effect of venom of bees, accidental (unintentional), initial encounter: Secondary | ICD-10-CM | POA: Insufficient documentation

## 2015-06-19 MED ORDER — METHYLPREDNISOLONE ACETATE 80 MG/ML IJ SUSP
120.0000 mg | Freq: Once | INTRAMUSCULAR | Status: AC
  Administered 2015-06-19: 120 mg via INTRAMUSCULAR

## 2015-06-19 MED ORDER — HYDROXYZINE HCL 50 MG PO TABS: 50.0000 mg | ORAL_TABLET | Freq: Three times a day (TID) | ORAL | Status: DC | PRN

## 2015-06-19 NOTE — Patient Instructions (Signed)

## 2015-06-19 NOTE — Progress Notes (Signed)
Subjective:  Patient ID: Pam Scott, female    DOB: January 13, 1946  Age: 69 y.o. MRN: [id number]  CC: Insect Bite   HPI Pam Scott presents for evaluation of a bee sting on her left thumb that occurred about 16 hours ago. She believes it was a yellow jacket. She has tried over-the-counter doses of Benadryl but says the redness and swelling has extended over her hand and up to her forearm. She states when it initially happened she had hives that lasted very briefly and have since resolved. She has had no trouble breathing or swallowing.  Outpatient Prescriptions Prior to Visit  Medication Sig Dispense Refill  . bimatoprost (LUMIGAN) 0.03 % ophthalmic drops 1 drop at bedtime.      . Cholecalciferol (VITAMIN D3) 2000 UNITS TABS Take by mouth.      . citalopram (CELEXA) 20 MG tablet TAKE 1 TABLET ONCE DAILY 90 tablet 3  . Diclofenac Sodium 2 % SOLN Apply twice daily. 112 g 1  . Doxepin HCl (SILENOR) 3 MG TABS Take 1 tablet (3 mg total) by mouth at bedtime as needed. 30 tablet 11  . estradiol (ESTRACE) 0.5 MG tablet TAKE 1 TABLET ONCE DAILY 90 tablet 3  . hydrochlorothiazide (MICROZIDE) 12.5 MG capsule Take 1 capsule (12.5 mg total) by mouth daily. 90 capsule 2  . HYDROcodone-acetaminophen (NORCO/VICODIN) 5-325 MG per tablet Take 1 tablet by mouth every 6 (six) hours as needed. 90 tablet 0   No facility-administered medications prior to visit.    ROS Review of Systems  Constitutional: Negative.  Negative for fever, chills, diaphoresis, appetite change and fatigue.  HENT: Negative.  Negative for facial swelling, sinus pressure, trouble swallowing and voice change.   Eyes: Negative.   Respiratory: Negative.  Negative for cough, choking, shortness of breath, wheezing and stridor.   Cardiovascular: Negative.  Negative for chest pain, palpitations and leg swelling.  Gastrointestinal: Negative.  Negative for nausea, vomiting, abdominal pain, diarrhea and constipation.  Endocrine:  Negative.   Genitourinary: Negative.   Musculoskeletal: Negative.   Skin: Positive for color change and rash. Negative for pallor and wound.  Allergic/Immunologic: Negative.   Neurological: Negative.  Negative for dizziness, tremors, light-headedness and headaches.  Hematological: Negative.   Psychiatric/Behavioral: Negative.     Objective:  BP 150/96 mmHg  Pulse 71  Temp(Src) 98.6 F (37 C) (Oral)  Ht 5\' 4"  (1.626 m)  Wt 147 lb (66.679 kg)  BMI 25.22 kg/m2  SpO2 94%  BP Readings from Last 3 Encounters:  06/19/15 150/96  04/17/15 138/94  03/12/15 150/88    Wt Readings from Last 3 Encounters:  06/19/15 147 lb (66.679 kg)  04/17/15 144 lb (65.318 kg)  03/12/15 144 lb 8 oz (65.545 kg)    Physical Exam  Constitutional: She is oriented to person, place, and time.  Non-toxic appearance. She does not have a sickly appearance. She does not appear ill. No distress.  HENT:  Mouth/Throat: Oropharynx is clear and moist and mucous membranes are normal. Mucous membranes are not pale, not dry and not cyanotic. No oral lesions. No trismus in the jaw. No uvula swelling. No oropharyngeal exudate, posterior oropharyngeal edema or tonsillar abscesses.  Eyes: Conjunctivae are normal. Right eye exhibits no discharge. Left eye exhibits no discharge. No scleral icterus.  Neck: Normal range of motion. Neck supple. No JVD present. No tracheal deviation present. No thyromegaly present.  Cardiovascular: Normal rate, regular rhythm, normal heart sounds and intact distal pulses.  Exam reveals no gallop and no  friction rub.   No murmur heard. Pulses:      Radial pulses are 2+ on the right side, and 2+ on the left side.  Pulmonary/Chest: Effort normal and breath sounds normal. No stridor. No respiratory distress. She has no wheezes. She has no rales. She exhibits no tenderness.  Abdominal: Soft. Bowel sounds are normal. She exhibits no distension and no mass. There is no tenderness. There is no rebound  and no guarding.  Musculoskeletal: Normal range of motion. She exhibits no edema or tenderness.       Left forearm: She exhibits swelling. She exhibits no tenderness, no bony tenderness, no edema, no deformity and no laceration.       Arms:      Left hand: She exhibits swelling. She exhibits normal range of motion, no tenderness, no bony tenderness, normal two-point discrimination and normal capillary refill. Normal sensation noted. Normal strength noted.  Lymphadenopathy:    She has no cervical adenopathy.  Neurological: She is oriented to person, place, and time.  Skin: Skin is warm and dry. No rash noted. She is not diaphoretic. There is erythema. No pallor.    Lab Results  Component Value Date   WBC 6.9 04/17/2015   HGB 13.8 04/17/2015   HCT 41.2 04/17/2015   PLT 222.0 04/17/2015   GLUCOSE 97 04/17/2015   CHOL 274* 04/17/2015   TRIG 77.0 04/17/2015   HDL 88.00 04/17/2015   LDLDIRECT 159.9 06/23/2013   LDLCALC 171* 04/17/2015   ALT 20 04/17/2015   AST 18 04/17/2015   NA 138 04/17/2015   K 3.7 04/17/2015   CL 101 04/17/2015   CREATININE 0.89 04/17/2015   BUN 19 04/17/2015   CO2 32 04/17/2015   TSH 1.42 04/17/2015    Mm Digital Screening Bilateral  05/08/2015   CLINICAL DATA:  Screening.  EXAM: DIGITAL SCREENING BILATERAL MAMMOGRAM WITH CAD  COMPARISON:  Previous exam(s).  ACR Breast Density Category b: There are scattered areas of fibroglandular density.  FINDINGS: There are no findings suspicious for malignancy. Images were processed with CAD.  IMPRESSION: No mammographic evidence of malignancy. A result letter of this screening mammogram will be mailed directly to the patient.  RECOMMENDATION: Screening mammogram in one year. (Code:SM-B-01Y)  BI-RADS CATEGORY  1: Negative.   Electronically Signed   By: Curlene Dolphin M.D.   On: 05/08/2015 14:47    Assessment & Plan:   Gianna was seen today for insect bite.  Diagnoses and all orders for this visit:  Allergic reaction to  bee sting, undetermined intent, initial encounter- she has an intense local reaction but I do not see any evidence of a systemic allergic reaction. Will treat with an injection of Depo-Medrol and will increase her antihistamine therapy to high-dose hydroxyzine. She agrees to ice and elevate the left arm is much as possible for the next 3 days. Orders: -     hydrOXYzine (ATARAX/VISTARIL) 50 MG tablet; Take 1 tablet (50 mg total) by mouth 3 (three) times daily as needed. -     methylPREDNISolone acetate (DEPO-MEDROL) injection 120 mg; Inject 1.5 mLs (120 mg total) into the muscle once.   I am having Ms. Rammel start on hydrOXYzine. I am also having her maintain her bimatoprost, Vitamin D3, citalopram, Diclofenac Sodium, Doxepin HCl, estradiol, hydrochlorothiazide, HYDROcodone-acetaminophen, and LUMIGAN. We administered methylPREDNISolone acetate.  Meds ordered this encounter  Medications  . DISCONTD: HYDROcodone-acetaminophen (NORCO) 10-325 MG per tablet    Sig: TAKE 1 TABLET BY MOUTH EVERY 3 TO 4  HOURS AS NEEDED FOR PAIN    Refill:  0  . LUMIGAN 0.01 % SOLN    Sig: INSTILL 1 DROP INTO BOTH EYES EVERY NIGHT AS DIRECTED    Refill:  5  . hydrOXYzine (ATARAX/VISTARIL) 50 MG tablet    Sig: Take 1 tablet (50 mg total) by mouth 3 (three) times daily as needed.    Dispense:  30 tablet    Refill:  0  . methylPREDNISolone acetate (DEPO-MEDROL) injection 120 mg    Sig:      Follow-up: Return if symptoms worsen or fail to improve.  Scarlette Calico, MD

## 2015-06-19 NOTE — Progress Notes (Signed)
Pre visit review using our clinic review tool, if applicable. No additional management support is needed unless otherwise documented below in the visit note. 

## 2015-06-24 ENCOUNTER — Other Ambulatory Visit: Payer: Self-pay | Admitting: Internal Medicine

## 2015-07-16 ENCOUNTER — Other Ambulatory Visit: Payer: Self-pay

## 2015-07-16 DIAGNOSIS — T63444A Toxic effect of venom of bees, undetermined, initial encounter: Secondary | ICD-10-CM

## 2015-07-16 MED ORDER — HYDROXYZINE HCL 50 MG PO TABS: 50.0000 mg | ORAL_TABLET | Freq: Three times a day (TID) | ORAL | Status: DC | PRN

## 2015-08-21 ENCOUNTER — Other Ambulatory Visit: Payer: Self-pay

## 2015-08-21 DIAGNOSIS — IMO0001 Reserved for inherently not codable concepts without codable children: Secondary | ICD-10-CM

## 2015-08-21 MED ORDER — PREGABALIN 75 MG PO CAPS: 75.0000 mg | ORAL_CAPSULE | Freq: Two times a day (BID) | ORAL | Status: DC

## 2015-08-21 NOTE — Telephone Encounter (Signed)
Ok to fill 

## 2015-09-26 ENCOUNTER — Ambulatory Visit (INDEPENDENT_AMBULATORY_CARE_PROVIDER_SITE_OTHER): Payer: BLUE CROSS/BLUE SHIELD | Admitting: Family Medicine

## 2015-09-26 ENCOUNTER — Other Ambulatory Visit (INDEPENDENT_AMBULATORY_CARE_PROVIDER_SITE_OTHER): Payer: BLUE CROSS/BLUE SHIELD

## 2015-09-26 ENCOUNTER — Encounter: Payer: Self-pay | Admitting: Family Medicine

## 2015-09-26 VITALS — BP 118/82 | HR 78 | Wt 148.0 lb

## 2015-09-26 DIAGNOSIS — M19011 Primary osteoarthritis, right shoulder: Secondary | ICD-10-CM

## 2015-09-26 DIAGNOSIS — M25511 Pain in right shoulder: Secondary | ICD-10-CM

## 2015-09-26 DIAGNOSIS — Z23 Encounter for immunization: Secondary | ICD-10-CM | POA: Diagnosis not present

## 2015-09-26 NOTE — Progress Notes (Signed)
CC: Shoulder and neck pain follow up   HPI: Patient is a 69 year old female with a three-month history of right shoulder pain. Patient has had this pain intermittently for quite some time. Known to have significant arthritis of this shoulder. Last injection was back in April is greater in 6 months ago. Patient states overall she was doing very well until the last couple weeks. Started having severe pain. Was doing more activity outside. Cause of pain as a dull, throbbing aching sensation.  Past medical, surgical, family and social history reviewed. Medications reviewed all in the electronic medical record.   Review of Systems: No headache, visual changes, nausea, vomiting, diarrhea, constipation, dizziness, abdominal pain, skin rash, fevers, chills, night sweats, weight loss, swollen lymph nodes, body aches, joint swelling, muscle aches, chest pain, shortness of breath, mood changes.   Objective:    Blood pressure 118/82, pulse 78, weight 148 lb (67.132 kg), SpO2 92 %.    General: No apparent distress alert and oriented x3 mood and affect normal, dressed appropriately.  HEENT: Pupils equal, extraocular movements intact Respiratory: Patient's speak in full sentences and does not appear short of breath Cardiovascular: No lower extremity edema, non tender, no erythema Skin: Warm dry intact with no signs of infection or rash on extremities or on axial skeleton. Abdomen: Soft nontender Neuro: Cranial nerves II through XII are intact, neurovascularly intact in all extremities with 2+ DTRs and 2+ pulses. Lymph: No lymphadenopathy of posterior or anterior cervical chain or axillae bilaterally.  Gait normal with good balance and coordination.  MSK: Non tender with full range of motion and good stability and symmetric strength and tone of shoulders, elbows, wrist, hip, knee and ankles bilaterally.  Neck: Inspection unremarkable. No palpable stepoffs. Positive Spurling's maneuver with right  radiculopathy which is a new symptoms from previous exam. No significant limitation in range of motion. Grip strength and sensation normal in bilateral hands Strength good C4 to T1 distribution No sensory change to C4 to T1 Negative Hoffman sign bilaterally Reflexes normal  Shoulder: Right Inspection reveals no abnormalities, atrophy or asymmetry. Palpation is normal with no tenderness over AC joint or bicipital groove range of motion is full in all planes. . Rotator cuff strength 4 out of 5 strength Positive Neer and Hawkin's tests, empty can sign. No change from previous exam Speeds and Yergason's tests normal. Negative labral pathology today. Normal scapular function observed. No painful arc and no drop arm sign. No apprehension sign  Contralateral shoulder unremarkable.  Procedure: Real-time Ultrasound Guided Injection of right glenohumeral joint Device: GE Logiq E  Ultrasound guided injection is preferred based studies that show increased duration, increased effect, greater accuracy, decreased procedural pain, increased response rate with ultrasound guided versus blind injection.  Verbal informed consent obtained.  Time-out conducted.  Noted no overlying erythema, induration, or other signs of local infection.  Skin prepped in a sterile fashion.  Local anesthesia: Topical Ethyl chloride.  With sterile technique and under real time ultrasound guidance:  Joint visualized.  23g 1  inch needle inserted posterior approach. Pictures taken for needle placement. Patient did have injection of 2 cc of 1% lidocaine, 2 cc of 0.5% Marcaine, and 1.0 cc of Kenalog 40 mg/dL. Completed without difficulty  Pain immediately resolved suggesting accurate placement of the medication.  Advised to call if fevers/chills, erythema, induration, drainage, or persistent bleeding.  Images permanently stored and available for review in the ultrasound unit.  Impression: Technically successful ultrasound  guided injection.  Impression and Recommendations:  This case required medical decision making of moderate complexity.

## 2015-09-26 NOTE — Assessment & Plan Note (Signed)
Another injection today. Patient tolerated the procedure very well. We discussed icing regimen. Home exercises, as well as topical anti-inflammatories. Patient will continue remain active. Patient come back and see me again when another injection is needed greater than 10 weeks from now.

## 2015-09-26 NOTE — Patient Instructions (Signed)
Good to see you Ice will always be your friend Continue to do what you can but avoid excessive over the head lifting.  Continue the vitamin D for sure.  See me again when you need me.

## 2015-10-15 ENCOUNTER — Other Ambulatory Visit: Payer: Self-pay | Admitting: Internal Medicine

## 2015-10-18 ENCOUNTER — Other Ambulatory Visit: Payer: Self-pay

## 2015-10-18 DIAGNOSIS — I10 Essential (primary) hypertension: Secondary | ICD-10-CM

## 2015-10-18 MED ORDER — HYDROCHLOROTHIAZIDE 12.5 MG PO CAPS: 12.5000 mg | ORAL_CAPSULE | Freq: Every day | ORAL | Status: DC

## 2015-11-15 ENCOUNTER — Telehealth: Payer: Self-pay | Admitting: Family Medicine

## 2015-11-15 NOTE — Telephone Encounter (Signed)
Pt claims she is having pain in her right shoulder and is needing another shot.

## 2015-11-15 NOTE — Telephone Encounter (Signed)
Discussed with pt, she is scheduled 1.5.17 to receive another injection.

## 2015-11-15 NOTE — Telephone Encounter (Signed)
I can see her before she leaves for the wedding. She needs to see a surgeon then if this is not helping.

## 2015-11-22 ENCOUNTER — Encounter: Payer: Self-pay | Admitting: Internal Medicine

## 2015-11-22 ENCOUNTER — Other Ambulatory Visit (INDEPENDENT_AMBULATORY_CARE_PROVIDER_SITE_OTHER): Payer: BLUE CROSS/BLUE SHIELD

## 2015-11-22 ENCOUNTER — Ambulatory Visit (INDEPENDENT_AMBULATORY_CARE_PROVIDER_SITE_OTHER): Payer: BLUE CROSS/BLUE SHIELD | Admitting: Internal Medicine

## 2015-11-22 ENCOUNTER — Encounter: Payer: Self-pay | Admitting: Family Medicine

## 2015-11-22 ENCOUNTER — Ambulatory Visit (INDEPENDENT_AMBULATORY_CARE_PROVIDER_SITE_OTHER): Payer: BLUE CROSS/BLUE SHIELD | Admitting: Family Medicine

## 2015-11-22 VITALS — BP 120/86 | HR 64 | Temp 98.3°F | Resp 20 | Ht 64.0 in | Wt 145.0 lb

## 2015-11-22 VITALS — BP 122/82 | HR 53

## 2015-11-22 DIAGNOSIS — M19011 Primary osteoarthritis, right shoulder: Secondary | ICD-10-CM

## 2015-11-22 DIAGNOSIS — Z1159 Encounter for screening for other viral diseases: Secondary | ICD-10-CM | POA: Insufficient documentation

## 2015-11-22 DIAGNOSIS — M25511 Pain in right shoulder: Secondary | ICD-10-CM | POA: Diagnosis not present

## 2015-11-22 DIAGNOSIS — M609 Myositis, unspecified: Secondary | ICD-10-CM

## 2015-11-22 DIAGNOSIS — I10 Essential (primary) hypertension: Secondary | ICD-10-CM

## 2015-11-22 DIAGNOSIS — M791 Myalgia: Secondary | ICD-10-CM | POA: Diagnosis not present

## 2015-11-22 DIAGNOSIS — Z23 Encounter for immunization: Secondary | ICD-10-CM | POA: Diagnosis not present

## 2015-11-22 DIAGNOSIS — IMO0001 Reserved for inherently not codable concepts without codable children: Secondary | ICD-10-CM

## 2015-11-22 DIAGNOSIS — Z1211 Encounter for screening for malignant neoplasm of colon: Secondary | ICD-10-CM

## 2015-11-22 DIAGNOSIS — M5412 Radiculopathy, cervical region: Secondary | ICD-10-CM

## 2015-11-22 LAB — BASIC METABOLIC PANEL
BUN: 19 mg/dL (ref 6–23)
CALCIUM: 9.5 mg/dL (ref 8.4–10.5)
CO2: 28 mEq/L (ref 19–32)
CREATININE: 0.87 mg/dL (ref 0.40–1.20)
Chloride: 105 mEq/L (ref 96–112)
GFR: 68.85 mL/min (ref >60.00)
Glucose, Bld: 90 mg/dL (ref 70–99)
Potassium: 3.8 mEq/L (ref 3.5–5.1)
SODIUM: 140 meq/L (ref 135–145)

## 2015-11-22 MED ORDER — HYDROCODONE-ACETAMINOPHEN 5-325 MG PO TABS: 1.0000 | ORAL_TABLET | Freq: Four times a day (QID) | ORAL | Status: DC | PRN

## 2015-11-22 NOTE — Assessment & Plan Note (Signed)
Patient does have known arthritis in the shoulder. Patient does have good range of motion somewhat. Patient does have a past medical history significant for cervical radiculitis that also could be contribute in. We discussed with patient at great length. I do feel that a MRI could be of significant value. Patient is going to be scheduled for this MRI and we will discuss findings. Patient will have this done after the wedding. We discussed continuing all other conservative therapy. Patient is fairly adamant that she would like to avoid any type of surgical intervention but I do feel that she should have the possibility.  Spent  25 minutes with patient face-to-face and had greater than 50% of counseling including as described above in assessment and plan.

## 2015-11-22 NOTE — Patient Instructions (Signed)
Good to see you Injected again Ice is your friend We need MRI or see orthopaedic surgery if this does not last Have a great time Send me a message in 2 weeks for an update.

## 2015-11-22 NOTE — Patient Instructions (Signed)
Hypertension Hypertension, commonly called high blood pressure, is when the force of blood pumping through your arteries is too strong. Your arteries are the blood vessels that carry blood from your heart throughout your body. A blood pressure reading consists of a higher number over a lower number, such as 110/72. The higher number (systolic) is the pressure inside your arteries when your heart pumps. The lower number (diastolic) is the pressure inside your arteries when your heart relaxes. Ideally you want your blood pressure below 120/80. Hypertension forces your heart to work harder to pump blood. Your arteries may become narrow or stiff. Having untreated or uncontrolled hypertension can cause heart attack, stroke, kidney disease, and other problems. RISK FACTORS Some risk factors for high blood pressure are controllable. Others are not.  Risk factors you cannot control include:   Race. You may be at higher risk if you are African American.  Age. Risk increases with age.  Gender. Men are at higher risk than women before age 45 years. After age 65, women are at higher risk than men. Risk factors you can control include:  Not getting enough exercise or physical activity.  Being overweight.  Getting too much fat, sugar, calories, or salt in your diet.  Drinking too much alcohol. SIGNS AND SYMPTOMS Hypertension does not usually cause signs or symptoms. Extremely high blood pressure (hypertensive crisis) may cause headache, anxiety, shortness of breath, and nosebleed. DIAGNOSIS To check if you have hypertension, your health care provider will measure your blood pressure while you are seated, with your arm held at the level of your heart. It should be measured at least twice using the same arm. Certain conditions can cause a difference in blood pressure between your right and left arms. A blood pressure reading that is higher than normal on one occasion does not mean that you need treatment. If  it is not clear whether you have high blood pressure, you may be asked to return on a different day to have your blood pressure checked again. Or, you may be asked to monitor your blood pressure at home for 1 or more weeks. TREATMENT Treating high blood pressure includes making lifestyle changes and possibly taking medicine. Living a healthy lifestyle can help lower high blood pressure. You may need to change some of your habits. Lifestyle changes may include:  Following the DASH diet. This diet is high in fruits, vegetables, and whole grains. It is low in salt, red meat, and added sugars.  Keep your sodium intake below 2,300 mg per day.  Getting at least 30-45 minutes of aerobic exercise at least 4 times per week.  Losing weight if necessary.  Not smoking.  Limiting alcoholic beverages.  Learning ways to reduce stress. Your health care provider may prescribe medicine if lifestyle changes are not enough to get your blood pressure under control, and if one of the following is true:  You are 18-59 years of age and your systolic blood pressure is above 140.  You are 60 years of age or older, and your systolic blood pressure is above 150.  Your diastolic blood pressure is above 90.  You have diabetes, and your systolic blood pressure is over 140 or your diastolic blood pressure is over 90.  You have kidney disease and your blood pressure is above 140/90.  You have heart disease and your blood pressure is above 140/90. Your personal target blood pressure may vary depending on your medical conditions, your age, and other factors. HOME CARE INSTRUCTIONS    Have your blood pressure rechecked as directed by your health care provider.   Take medicines only as directed by your health care provider. Follow the directions carefully. Blood pressure medicines must be taken as prescribed. The medicine does not work as well when you skip doses. Skipping doses also puts you at risk for  problems.  Do not smoke.   Monitor your blood pressure at home as directed by your health care provider. SEEK MEDICAL CARE IF:   You think you are having a reaction to medicines taken.  You have recurrent headaches or feel dizzy.  You have swelling in your ankles.  You have trouble with your vision. SEEK IMMEDIATE MEDICAL CARE IF:  You develop a severe headache or confusion.  You have unusual weakness, numbness, or feel faint.  You have severe chest or abdominal pain.  You vomit repeatedly.  You have trouble breathing. MAKE SURE YOU:   Understand these instructions.  Will watch your condition.  Will get help right away if you are not doing well or get worse.   This information is not intended to replace advice given to you by your health care provider. Make sure you discuss any questions you have with your health care provider.   Document Released: 11/03/2005 Document Revised: 03/20/2015 Document Reviewed: 08/26/2013 Elsevier Interactive Patient Education 2016 [employer]  

## 2015-11-22 NOTE — Progress Notes (Signed)
Pre visit review using our clinic review tool, if applicable. No additional management support is needed unless otherwise documented below in the visit note. 

## 2015-11-22 NOTE — Progress Notes (Signed)
CC: Shoulder and neck pain follow up   HPI: Patient is a 70 year old female with a three-month history of right shoulder pain. Patient has had this pain intermittently for quite some time. Known to have significant arthritis of this shoulder. Last injection was greater than 2 months ago. Having worsening symptoms. Did not seem to help as much as the previous one. Patient continues to have discomfort and states that now her upper back seems to be giving her more pain as well. Trying to remain active. Patient's youngest daughter is getting married in once did not have pain in the shoulder if she can.  Past medical, surgical, family and social history reviewed. Medications reviewed all in the electronic medical record.   Review of Systems: No headache, visual changes, nausea, vomiting, diarrhea, constipation, dizziness, abdominal pain, skin rash, fevers, chills, night sweats, weight loss, swollen lymph nodes, body aches, joint swelling, muscle aches, chest pain, shortness of breath, mood changes.   Objective:    Blood pressure 122/82, pulse 53, SpO2 97 %.    General: No apparent distress alert and oriented x3 mood and affect normal, dressed appropriately.  HEENT: Pupils equal, extraocular movements intact Respiratory: Patient's speak in full sentences and does not appear short of breath Cardiovascular: No lower extremity edema, non tender, no erythema Skin: Warm dry intact with no signs of infection or rash on extremities or on axial skeleton. Abdomen: Soft nontender Neuro: Cranial nerves II through XII are intact, neurovascularly intact in all extremities with 2+ DTRs and 2+ pulses. Lymph: No lymphadenopathy of posterior or anterior cervical chain or axillae bilaterally.  Gait normal with good balance and coordination.  MSK: Non tender with full range of motion and good stability and symmetric strength and tone of shoulders, elbows, wrist, hip, knee and ankles bilaterally.  Neck: Inspection  unremarkable. No palpable stepoffs. Positive Spurling's maneuver with right radiculopathy which is a new symptoms from previous exam. No significant limitation in range of motion. Grip strength and sensation normal in bilateral hands Strength good C4 to T1 distribution No sensory change to C4 to T1 Negative Hoffman sign bilaterally Reflexes normal  Shoulder: Right Inspection reveals no abnormalities, atrophy or asymmetry. Palpation is normal with no tenderness over AC joint or bicipital groove range of motion is full in all planes. . Rotator cuff strength 4 out of 5 strength and possibly improved Positive Neer and Hawkin's tests, empty can sign. Worsening symptoms and previous exam Speeds and Yergason's tests normal. Negative labral pathology today. Normal scapular function observed. No painful arc and no drop arm sign. No apprehension sign  Contralateral shoulder unremarkable.  Procedure: Real-time Ultrasound Guided Injection of right glenohumeral joint Device: GE Logiq E  Ultrasound guided injection is preferred based studies that show increased duration, increased effect, greater accuracy, decreased procedural pain, increased response rate with ultrasound guided versus blind injection.  Verbal informed consent obtained.  Time-out conducted.  Noted no overlying erythema, induration, or other signs of local infection.  Skin prepped in a sterile fashion.  Local anesthesia: Topical Ethyl chloride.  With sterile technique and under real time ultrasound guidance:  Joint visualized.  23g 1  inch needle inserted posterior approach. Pictures taken for needle placement. Patient did have injection of 2 cc of 1% lidocaine, 2 cc of 0.5% Marcaine, and 1.0 cc of Kenalog 40 mg/dL. Completed without difficulty  Pain immediately resolved suggesting accurate placement of the medication.  Advised to call if fevers/chills, erythema, induration, drainage, or persistent bleeding.  Images permanently  stored and available for review in the ultrasound unit.  Impression: Technically successful ultrasound guided injection.  Impression and Recommendations:     This case required medical decision making of moderate complexity.

## 2015-11-22 NOTE — Progress Notes (Signed)
Subjective:  Patient ID: Pam Scott, female    DOB: [birth year]  Age: 70 y.o. MRN: [id number]  CC: Osteoarthritis and Hypertension   HPI Pam Scott presents for blood pressure check and medication refill. She continues to complain of joint pain in her shoulders and knees.  Outpatient Prescriptions Prior to Visit  Medication Sig Dispense Refill  . bimatoprost (LUMIGAN) 0.03 % ophthalmic drops 1 drop at bedtime.      . Cholecalciferol (VITAMIN D3) 2000 UNITS TABS Take by mouth.      . citalopram (CELEXA) 20 MG tablet TAKE 1 TABLET DAILY 90 tablet 2  . Diclofenac Sodium 2 % SOLN Apply twice daily. 112 g 1  . Doxepin HCl (SILENOR) 3 MG TABS Take 1 tablet (3 mg total) by mouth at bedtime as needed. 30 tablet 11  . estradiol (ESTRACE) 0.5 MG tablet TAKE 1 TABLET DAILY 90 tablet 2  . hydrochlorothiazide (MICROZIDE) 12.5 MG capsule Take 1 capsule (12.5 mg total) by mouth daily. 90 capsule 1  . LUMIGAN 0.01 % SOLN INSTILL 1 DROP INTO BOTH EYES EVERY NIGHT AS DIRECTED  5  . pregabalin (LYRICA) 75 MG capsule Take 1 capsule (75 mg total) by mouth 2 (two) times daily. 180 capsule 1  . HYDROcodone-acetaminophen (NORCO/VICODIN) 5-325 MG per tablet Take 1 tablet by mouth every 6 (six) hours as needed. 90 tablet 0  . hydrOXYzine (ATARAX/VISTARIL) 50 MG tablet Take 1 tablet (50 mg total) by mouth 3 (three) times daily as needed. (Patient not taking: Reported on 11/22/2015) 30 tablet 0   No facility-administered medications prior to visit.    ROS Review of Systems  Constitutional: Negative.  Negative for fever, chills, diaphoresis, appetite change and fatigue.  HENT: Negative.   Eyes: Negative.   Respiratory: Negative.  Negative for cough, choking, chest tightness, shortness of breath and stridor.   Cardiovascular: Negative.  Negative for chest pain, palpitations and leg swelling.  Gastrointestinal: Negative.  Negative for nausea, vomiting, abdominal pain, diarrhea, constipation and blood in  stool.  Endocrine: Negative.   Genitourinary: Negative.  Negative for dysuria, urgency, frequency, difficulty urinating, pelvic pain and dyspareunia.  Musculoskeletal: Positive for arthralgias and neck pain. Negative for myalgias, back pain and joint swelling.  Skin: Negative.  Negative for color change, pallor and rash.  Allergic/Immunologic: Negative.   Neurological: Negative.  Negative for dizziness, weakness and light-headedness.  Hematological: Negative.  Negative for adenopathy. Does not bruise/bleed easily.  Psychiatric/Behavioral: Negative.     Objective:  BP 120/86 mmHg  Pulse 64  Temp(Src) 98.3 F (36.8 C) (Oral)  Resp 20  Ht 5\' 4"  (1.626 m)  Wt 145 lb (65.772 kg)  BMI 24.88 kg/m2  SpO2 98%  BP Readings from Last 3 Encounters:  11/22/15 120/86  11/22/15 122/82  09/26/15 118/82    Wt Readings from Last 3 Encounters:  11/22/15 145 lb (65.772 kg)  09/26/15 148 lb (67.132 kg)  06/19/15 147 lb (66.679 kg)    Physical Exam  Constitutional: She is oriented to person, place, and time. No distress.  HENT:  Head: Normocephalic and atraumatic.  Mouth/Throat: Oropharynx is clear and moist.  Eyes: Conjunctivae are normal. Right eye exhibits no discharge. Left eye exhibits no discharge. No scleral icterus.  Neck: Normal range of motion. Neck supple. No JVD present. No tracheal deviation present. No thyromegaly present.  Cardiovascular: Normal rate, regular rhythm, normal heart sounds and intact distal pulses.  Exam reveals no gallop and no friction rub.   No murmur heard. Pulmonary/Chest:  Effort normal and breath sounds normal. No stridor. No respiratory distress. She has no wheezes. She has no rales. She exhibits no tenderness.  Abdominal: Soft. Bowel sounds are normal. She exhibits no distension and no mass. There is no tenderness. There is no rebound and no guarding.  Musculoskeletal: Normal range of motion. She exhibits no edema or tenderness.  Lymphadenopathy:    She  has no cervical adenopathy.  Neurological: She is oriented to person, place, and time.  Skin: Skin is warm and dry. No rash noted. She is not diaphoretic. No erythema. No pallor.  Vitals reviewed.   Lab Results  Component Value Date   WBC 6.9 04/17/2015   HGB 13.8 04/17/2015   HCT 41.2 04/17/2015   PLT 222.0 04/17/2015   GLUCOSE 90 11/22/2015   CHOL 274* 04/17/2015   TRIG 77.0 04/17/2015   HDL 88.00 04/17/2015   LDLDIRECT 159.9 06/23/2013   LDLCALC 171* 04/17/2015   ALT 20 04/17/2015   AST 18 04/17/2015   NA 140 11/22/2015   K 3.8 11/22/2015   CL 105 11/22/2015   CREATININE 0.87 11/22/2015   BUN 19 11/22/2015   CO2 28 11/22/2015   TSH 1.42 04/17/2015    Mm Digital Screening Bilateral  05/08/2015  CLINICAL DATA:  Screening. EXAM: DIGITAL SCREENING BILATERAL MAMMOGRAM WITH CAD COMPARISON:  Previous exam(s). ACR Breast Density Category b: There are scattered areas of fibroglandular density. FINDINGS: There are no findings suspicious for malignancy. Images were processed with CAD. IMPRESSION: No mammographic evidence of malignancy. A result letter of this screening mammogram will be mailed directly to the patient. RECOMMENDATION: Screening mammogram in one year. (Code:SM-B-01Y) BI-RADS CATEGORY  1: Negative. Electronically Signed   By: Curlene Dolphin M.D.   On: 05/08/2015 14:47    Assessment & Plan:   Pam Scott was seen today for osteoarthritis and hypertension.  Diagnoses and all orders for this visit:  Primary osteoarthritis of right shoulder -     Discontinue: HYDROcodone-acetaminophen (NORCO/VICODIN) 5-325 MG tablet; Take 1 tablet by mouth every 6 (six) hours as needed. -     Discontinue: HYDROcodone-acetaminophen (NORCO/VICODIN) 5-325 MG tablet; Take 1 tablet by mouth every 6 (six) hours as needed. -     HYDROcodone-acetaminophen (NORCO/VICODIN) 5-325 MG tablet; Take 1 tablet by mouth every 6 (six) hours as needed.  Cervical radiculitis -     Discontinue:  HYDROcodone-acetaminophen (NORCO/VICODIN) 5-325 MG tablet; Take 1 tablet by mouth every 6 (six) hours as needed. -     Discontinue: HYDROcodone-acetaminophen (NORCO/VICODIN) 5-325 MG tablet; Take 1 tablet by mouth every 6 (six) hours as needed. -     HYDROcodone-acetaminophen (NORCO/VICODIN) 5-325 MG tablet; Take 1 tablet by mouth every 6 (six) hours as needed.  Essential hypertension, benign- her blood pressure is well-controlled, lites and renal function are stable. -     Basic metabolic panel; Future  Myalgia and myositis -     Discontinue: HYDROcodone-acetaminophen (NORCO/VICODIN) 5-325 MG tablet; Take 1 tablet by mouth every 6 (six) hours as needed. -     Discontinue: HYDROcodone-acetaminophen (NORCO/VICODIN) 5-325 MG tablet; Take 1 tablet by mouth every 6 (six) hours as needed. -     HYDROcodone-acetaminophen (NORCO/VICODIN) 5-325 MG tablet; Take 1 tablet by mouth every 6 (six) hours as needed.  Screen for colon cancer -     Ambulatory referral to Gastroenterology  Need for 23-polyvalent pneumococcal polysaccharide vaccine -     Pneumococcal polysaccharide vaccine 23-valent greater than or equal to 2yo subcutaneous/IM  I have discontinued Pam Scott's hydrOXYzine. I am also having her maintain her bimatoprost, Vitamin D3, Diclofenac Sodium, Doxepin HCl, LUMIGAN, citalopram, pregabalin, estradiol, hydrochlorothiazide, and HYDROcodone-acetaminophen.  Meds ordered this encounter  Medications  . DISCONTD: HYDROcodone-acetaminophen (NORCO/VICODIN) 5-325 MG tablet    Sig: Take 1 tablet by mouth every 6 (six) hours as needed.    Dispense:  90 tablet    Refill:  0    Refill on or after 11/22/15  . DISCONTD: HYDROcodone-acetaminophen (NORCO/VICODIN) 5-325 MG tablet    Sig: Take 1 tablet by mouth every 6 (six) hours as needed.    Dispense:  90 tablet    Refill:  0    Refill on or after 12/23/15  . HYDROcodone-acetaminophen (NORCO/VICODIN) 5-325 MG tablet    Sig: Take 1 tablet by mouth  every 6 (six) hours as needed.    Dispense:  90 tablet    Refill:  0    Refill on or after 01/20/16     Follow-up: Return in about 4 months (around 03/21/2016).  Scarlette Calico, MD

## 2015-12-28 ENCOUNTER — Encounter: Payer: Self-pay | Admitting: Gastroenterology

## 2016-01-18 ENCOUNTER — Ambulatory Visit
Admission: RE | Admit: 2016-01-18 | Discharge: 2016-01-18 | Disposition: A | Discharge status: Home / Self Care | Payer: BLUE CROSS/BLUE SHIELD | Source: Ambulatory Visit | Attending: Family Medicine | Admitting: Family Medicine

## 2016-01-18 DIAGNOSIS — M25511 Pain in right shoulder: Secondary | ICD-10-CM

## 2016-01-18 MED ORDER — IOHEXOL 180 MG/ML  SOLN
15.0000 mL | Freq: Once | INTRAMUSCULAR | Status: AC | PRN
  Administered 2016-01-18: 15 mL via INTRA_ARTICULAR

## 2016-01-20 ENCOUNTER — Encounter: Payer: Self-pay | Admitting: Family Medicine

## 2016-01-21 ENCOUNTER — Other Ambulatory Visit: Payer: Self-pay

## 2016-01-21 ENCOUNTER — Telehealth: Payer: Self-pay | Admitting: Family Medicine

## 2016-01-21 DIAGNOSIS — M25511 Pain in right shoulder: Secondary | ICD-10-CM

## 2016-01-21 NOTE — Telephone Encounter (Signed)
Please give patient call in regards to MRI results and needing a referral for surgery.

## 2016-01-21 NOTE — Telephone Encounter (Signed)
Patient called back to advise that she would like to schedule with dr landau @ [facility]

## 2016-01-21 NOTE — Telephone Encounter (Signed)
Patient has called back in regards.  Did notify patient that Dr. Tamala Julian just got back into the office today and that the schedule has been busy this morning.  She is requesting a call back as soon as possible.  She would like to know who she should go to for rotator cuff surgery.  She does not want Dr. Tamala Julian to refer her right now though.

## 2016-01-21 NOTE — Telephone Encounter (Signed)
Spoke with patient. Discussed options for referral to Dr. Mardelle Matte or Dr. Tamera Punt. Will call back with who she chooses to see

## 2016-01-22 NOTE — Telephone Encounter (Signed)
Spoke with patient. Notes faxed

## 2016-01-22 NOTE — Telephone Encounter (Signed)
States Dr. Mardelle Matte did not receive records.  States Jeral Fruit was to fax yesterday.  States notes need to be faxed to [phone]

## 2016-01-22 NOTE — Telephone Encounter (Signed)
error 

## 2016-01-24 ENCOUNTER — Telehealth: Payer: Self-pay | Admitting: Internal Medicine

## 2016-01-24 DIAGNOSIS — M67911 Unspecified disorder of synovium and tendon, right shoulder: Secondary | ICD-10-CM

## 2016-01-24 MED ORDER — HYDROCODONE-ACETAMINOPHEN 10-325 MG PO TABS: 1.0000 | ORAL_TABLET | Freq: Four times a day (QID) | ORAL | Status: DC | PRN

## 2016-01-24 NOTE — Telephone Encounter (Signed)
Rx for higher strength written

## 2016-01-24 NOTE — Telephone Encounter (Signed)
Pt is having rotator cuff surgery in a few weeks and her prescription for HYDROcodone-acetaminophen (NORCO/VICODIN) 5-325 MG tablet [id number] is just not strong enough. She is hoping you can increase her dosage until she can have this surgery. Please give her a call on her cell phone

## 2016-01-24 NOTE — Telephone Encounter (Signed)
Pt informed and in the cabinet ready for pick up

## 2016-01-24 NOTE — Telephone Encounter (Signed)
Please advise pt has called twice about this per Cecille Rubin

## 2016-02-14 ENCOUNTER — Telehealth: Payer: Self-pay | Admitting: *Deleted

## 2016-02-14 MED ORDER — HYDROCODONE-ACETAMINOPHEN 10-325 MG PO TABS: 1.0000 | ORAL_TABLET | Freq: Four times a day (QID) | ORAL | Status: DC | PRN

## 2016-02-14 NOTE — Telephone Encounter (Signed)
Requesting refill on her hydrocodone.../lmb 

## 2016-02-14 NOTE — Telephone Encounter (Signed)
done

## 2016-02-14 NOTE — Telephone Encounter (Signed)
Notified pt rx ready for pick-up.../lmb 

## 2016-02-28 DIAGNOSIS — M7541 Impingement syndrome of right shoulder: Secondary | ICD-10-CM | POA: Diagnosis not present

## 2016-02-28 DIAGNOSIS — G8918 Other acute postprocedural pain: Secondary | ICD-10-CM | POA: Diagnosis not present

## 2016-02-28 DIAGNOSIS — M7521 Bicipital tendinitis, right shoulder: Secondary | ICD-10-CM | POA: Diagnosis not present

## 2016-02-28 DIAGNOSIS — S46011A Strain of muscle(s) and tendon(s) of the rotator cuff of right shoulder, initial encounter: Secondary | ICD-10-CM | POA: Diagnosis not present

## 2016-02-28 DIAGNOSIS — M75121 Complete rotator cuff tear or rupture of right shoulder, not specified as traumatic: Secondary | ICD-10-CM | POA: Diagnosis not present

## 2016-02-28 DIAGNOSIS — M24111 Other articular cartilage disorders, right shoulder: Secondary | ICD-10-CM | POA: Diagnosis not present

## 2016-03-04 ENCOUNTER — Encounter: Payer: BLUE CROSS/BLUE SHIELD | Admitting: Gastroenterology

## 2016-03-12 DIAGNOSIS — M7541 Impingement syndrome of right shoulder: Secondary | ICD-10-CM | POA: Diagnosis not present

## 2016-03-12 DIAGNOSIS — M75111 Incomplete rotator cuff tear or rupture of right shoulder, not specified as traumatic: Secondary | ICD-10-CM | POA: Diagnosis not present

## 2016-04-09 DIAGNOSIS — M7541 Impingement syndrome of right shoulder: Secondary | ICD-10-CM | POA: Diagnosis not present

## 2016-04-15 DIAGNOSIS — M25611 Stiffness of right shoulder, not elsewhere classified: Secondary | ICD-10-CM | POA: Diagnosis not present

## 2016-04-15 DIAGNOSIS — M75121 Complete rotator cuff tear or rupture of right shoulder, not specified as traumatic: Secondary | ICD-10-CM | POA: Diagnosis not present

## 2016-04-15 DIAGNOSIS — M25511 Pain in right shoulder: Secondary | ICD-10-CM | POA: Diagnosis not present

## 2016-04-17 DIAGNOSIS — M25611 Stiffness of right shoulder, not elsewhere classified: Secondary | ICD-10-CM | POA: Diagnosis not present

## 2016-04-17 DIAGNOSIS — R531 Weakness: Secondary | ICD-10-CM | POA: Diagnosis not present

## 2016-04-17 DIAGNOSIS — M25511 Pain in right shoulder: Secondary | ICD-10-CM | POA: Diagnosis not present

## 2016-04-17 DIAGNOSIS — M75121 Complete rotator cuff tear or rupture of right shoulder, not specified as traumatic: Secondary | ICD-10-CM | POA: Diagnosis not present

## 2016-04-21 DIAGNOSIS — R531 Weakness: Secondary | ICD-10-CM | POA: Diagnosis not present

## 2016-04-21 DIAGNOSIS — M25511 Pain in right shoulder: Secondary | ICD-10-CM | POA: Diagnosis not present

## 2016-04-21 DIAGNOSIS — M75121 Complete rotator cuff tear or rupture of right shoulder, not specified as traumatic: Secondary | ICD-10-CM | POA: Diagnosis not present

## 2016-04-21 DIAGNOSIS — M25611 Stiffness of right shoulder, not elsewhere classified: Secondary | ICD-10-CM | POA: Diagnosis not present

## 2016-04-23 DIAGNOSIS — L820 Inflamed seborrheic keratosis: Secondary | ICD-10-CM | POA: Diagnosis not present

## 2016-04-23 DIAGNOSIS — L723 Sebaceous cyst: Secondary | ICD-10-CM | POA: Diagnosis not present

## 2016-04-24 DIAGNOSIS — M25611 Stiffness of right shoulder, not elsewhere classified: Secondary | ICD-10-CM | POA: Diagnosis not present

## 2016-04-24 DIAGNOSIS — R531 Weakness: Secondary | ICD-10-CM | POA: Diagnosis not present

## 2016-04-24 DIAGNOSIS — M25511 Pain in right shoulder: Secondary | ICD-10-CM | POA: Diagnosis not present

## 2016-04-24 DIAGNOSIS — M75121 Complete rotator cuff tear or rupture of right shoulder, not specified as traumatic: Secondary | ICD-10-CM | POA: Diagnosis not present

## 2016-04-29 DIAGNOSIS — R531 Weakness: Secondary | ICD-10-CM | POA: Diagnosis not present

## 2016-04-29 DIAGNOSIS — M25611 Stiffness of right shoulder, not elsewhere classified: Secondary | ICD-10-CM | POA: Diagnosis not present

## 2016-04-29 DIAGNOSIS — M75121 Complete rotator cuff tear or rupture of right shoulder, not specified as traumatic: Secondary | ICD-10-CM | POA: Diagnosis not present

## 2016-04-29 DIAGNOSIS — M25511 Pain in right shoulder: Secondary | ICD-10-CM | POA: Diagnosis not present

## 2016-05-01 DIAGNOSIS — M25511 Pain in right shoulder: Secondary | ICD-10-CM | POA: Diagnosis not present

## 2016-05-01 DIAGNOSIS — R531 Weakness: Secondary | ICD-10-CM | POA: Diagnosis not present

## 2016-05-01 DIAGNOSIS — M25611 Stiffness of right shoulder, not elsewhere classified: Secondary | ICD-10-CM | POA: Diagnosis not present

## 2016-05-01 DIAGNOSIS — M75121 Complete rotator cuff tear or rupture of right shoulder, not specified as traumatic: Secondary | ICD-10-CM | POA: Diagnosis not present

## 2016-05-05 DIAGNOSIS — R531 Weakness: Secondary | ICD-10-CM | POA: Diagnosis not present

## 2016-05-05 DIAGNOSIS — M25611 Stiffness of right shoulder, not elsewhere classified: Secondary | ICD-10-CM | POA: Diagnosis not present

## 2016-05-05 DIAGNOSIS — M25511 Pain in right shoulder: Secondary | ICD-10-CM | POA: Diagnosis not present

## 2016-05-05 DIAGNOSIS — M75121 Complete rotator cuff tear or rupture of right shoulder, not specified as traumatic: Secondary | ICD-10-CM | POA: Diagnosis not present

## 2016-05-13 DIAGNOSIS — M25611 Stiffness of right shoulder, not elsewhere classified: Secondary | ICD-10-CM | POA: Diagnosis not present

## 2016-05-13 DIAGNOSIS — M75121 Complete rotator cuff tear or rupture of right shoulder, not specified as traumatic: Secondary | ICD-10-CM | POA: Diagnosis not present

## 2016-05-13 DIAGNOSIS — R531 Weakness: Secondary | ICD-10-CM | POA: Diagnosis not present

## 2016-05-13 DIAGNOSIS — M25511 Pain in right shoulder: Secondary | ICD-10-CM | POA: Diagnosis not present

## 2016-05-15 DIAGNOSIS — M25511 Pain in right shoulder: Secondary | ICD-10-CM | POA: Diagnosis not present

## 2016-05-15 DIAGNOSIS — M25611 Stiffness of right shoulder, not elsewhere classified: Secondary | ICD-10-CM | POA: Diagnosis not present

## 2016-05-15 DIAGNOSIS — R531 Weakness: Secondary | ICD-10-CM | POA: Diagnosis not present

## 2016-05-15 DIAGNOSIS — M75121 Complete rotator cuff tear or rupture of right shoulder, not specified as traumatic: Secondary | ICD-10-CM | POA: Diagnosis not present

## 2016-05-19 DIAGNOSIS — M25511 Pain in right shoulder: Secondary | ICD-10-CM | POA: Diagnosis not present

## 2016-05-19 DIAGNOSIS — M25611 Stiffness of right shoulder, not elsewhere classified: Secondary | ICD-10-CM | POA: Diagnosis not present

## 2016-05-19 DIAGNOSIS — R531 Weakness: Secondary | ICD-10-CM | POA: Diagnosis not present

## 2016-05-19 DIAGNOSIS — M75121 Complete rotator cuff tear or rupture of right shoulder, not specified as traumatic: Secondary | ICD-10-CM | POA: Diagnosis not present

## 2016-05-22 DIAGNOSIS — H401122 Primary open-angle glaucoma, left eye, moderate stage: Secondary | ICD-10-CM | POA: Diagnosis not present

## 2016-05-22 DIAGNOSIS — H401112 Primary open-angle glaucoma, right eye, moderate stage: Secondary | ICD-10-CM | POA: Diagnosis not present

## 2016-05-23 ENCOUNTER — Other Ambulatory Visit: Payer: Self-pay | Admitting: Internal Medicine

## 2016-05-29 DIAGNOSIS — M75121 Complete rotator cuff tear or rupture of right shoulder, not specified as traumatic: Secondary | ICD-10-CM | POA: Diagnosis not present

## 2016-05-29 DIAGNOSIS — M25611 Stiffness of right shoulder, not elsewhere classified: Secondary | ICD-10-CM | POA: Diagnosis not present

## 2016-05-29 DIAGNOSIS — M25511 Pain in right shoulder: Secondary | ICD-10-CM | POA: Diagnosis not present

## 2016-05-29 DIAGNOSIS — R531 Weakness: Secondary | ICD-10-CM | POA: Diagnosis not present

## 2016-06-02 DIAGNOSIS — M75121 Complete rotator cuff tear or rupture of right shoulder, not specified as traumatic: Secondary | ICD-10-CM | POA: Diagnosis not present

## 2016-06-02 DIAGNOSIS — R531 Weakness: Secondary | ICD-10-CM | POA: Diagnosis not present

## 2016-06-02 DIAGNOSIS — M25611 Stiffness of right shoulder, not elsewhere classified: Secondary | ICD-10-CM | POA: Diagnosis not present

## 2016-06-02 DIAGNOSIS — M25511 Pain in right shoulder: Secondary | ICD-10-CM | POA: Diagnosis not present

## 2016-06-05 DIAGNOSIS — R531 Weakness: Secondary | ICD-10-CM | POA: Diagnosis not present

## 2016-06-05 DIAGNOSIS — M25611 Stiffness of right shoulder, not elsewhere classified: Secondary | ICD-10-CM | POA: Diagnosis not present

## 2016-06-05 DIAGNOSIS — M75121 Complete rotator cuff tear or rupture of right shoulder, not specified as traumatic: Secondary | ICD-10-CM | POA: Diagnosis not present

## 2016-06-05 DIAGNOSIS — M25511 Pain in right shoulder: Secondary | ICD-10-CM | POA: Diagnosis not present

## 2016-06-12 DIAGNOSIS — M75121 Complete rotator cuff tear or rupture of right shoulder, not specified as traumatic: Secondary | ICD-10-CM | POA: Diagnosis not present

## 2016-06-12 DIAGNOSIS — M25611 Stiffness of right shoulder, not elsewhere classified: Secondary | ICD-10-CM | POA: Diagnosis not present

## 2016-06-12 DIAGNOSIS — R531 Weakness: Secondary | ICD-10-CM | POA: Diagnosis not present

## 2016-06-12 DIAGNOSIS — M25511 Pain in right shoulder: Secondary | ICD-10-CM | POA: Diagnosis not present

## 2016-06-16 DIAGNOSIS — M75121 Complete rotator cuff tear or rupture of right shoulder, not specified as traumatic: Secondary | ICD-10-CM | POA: Diagnosis not present

## 2016-06-16 DIAGNOSIS — M25511 Pain in right shoulder: Secondary | ICD-10-CM | POA: Diagnosis not present

## 2016-06-16 DIAGNOSIS — R531 Weakness: Secondary | ICD-10-CM | POA: Diagnosis not present

## 2016-06-16 DIAGNOSIS — M25611 Stiffness of right shoulder, not elsewhere classified: Secondary | ICD-10-CM | POA: Diagnosis not present

## 2016-06-24 DIAGNOSIS — R531 Weakness: Secondary | ICD-10-CM | POA: Diagnosis not present

## 2016-06-24 DIAGNOSIS — M25611 Stiffness of right shoulder, not elsewhere classified: Secondary | ICD-10-CM | POA: Diagnosis not present

## 2016-06-24 DIAGNOSIS — M75121 Complete rotator cuff tear or rupture of right shoulder, not specified as traumatic: Secondary | ICD-10-CM | POA: Diagnosis not present

## 2016-06-24 DIAGNOSIS — M25511 Pain in right shoulder: Secondary | ICD-10-CM | POA: Diagnosis not present

## 2016-06-25 ENCOUNTER — Other Ambulatory Visit: Payer: Self-pay | Admitting: Internal Medicine

## 2016-06-25 DIAGNOSIS — Z1231 Encounter for screening mammogram for malignant neoplasm of breast: Secondary | ICD-10-CM

## 2016-06-26 DIAGNOSIS — M75121 Complete rotator cuff tear or rupture of right shoulder, not specified as traumatic: Secondary | ICD-10-CM | POA: Diagnosis not present

## 2016-06-26 DIAGNOSIS — R531 Weakness: Secondary | ICD-10-CM | POA: Diagnosis not present

## 2016-06-26 DIAGNOSIS — M25611 Stiffness of right shoulder, not elsewhere classified: Secondary | ICD-10-CM | POA: Diagnosis not present

## 2016-06-26 DIAGNOSIS — M25511 Pain in right shoulder: Secondary | ICD-10-CM | POA: Diagnosis not present

## 2016-07-01 ENCOUNTER — Ambulatory Visit: Payer: BLUE CROSS/BLUE SHIELD

## 2016-07-01 ENCOUNTER — Ambulatory Visit
Admission: RE | Admit: 2016-07-01 | Discharge: 2016-07-01 | Disposition: A | Discharge status: Home / Self Care | Payer: BLUE CROSS/BLUE SHIELD | Source: Ambulatory Visit | Attending: Internal Medicine | Admitting: Internal Medicine

## 2016-07-01 DIAGNOSIS — M25511 Pain in right shoulder: Secondary | ICD-10-CM | POA: Diagnosis not present

## 2016-07-01 DIAGNOSIS — M75121 Complete rotator cuff tear or rupture of right shoulder, not specified as traumatic: Secondary | ICD-10-CM | POA: Diagnosis not present

## 2016-07-01 DIAGNOSIS — Z1231 Encounter for screening mammogram for malignant neoplasm of breast: Secondary | ICD-10-CM

## 2016-07-01 DIAGNOSIS — M25611 Stiffness of right shoulder, not elsewhere classified: Secondary | ICD-10-CM | POA: Diagnosis not present

## 2016-07-01 DIAGNOSIS — R531 Weakness: Secondary | ICD-10-CM | POA: Diagnosis not present

## 2016-07-01 LAB — HM MAMMOGRAPHY

## 2016-07-03 DIAGNOSIS — R531 Weakness: Secondary | ICD-10-CM | POA: Diagnosis not present

## 2016-07-03 DIAGNOSIS — M25611 Stiffness of right shoulder, not elsewhere classified: Secondary | ICD-10-CM | POA: Diagnosis not present

## 2016-07-03 DIAGNOSIS — M25511 Pain in right shoulder: Secondary | ICD-10-CM | POA: Diagnosis not present

## 2016-07-03 DIAGNOSIS — M75121 Complete rotator cuff tear or rupture of right shoulder, not specified as traumatic: Secondary | ICD-10-CM | POA: Diagnosis not present

## 2016-07-08 DIAGNOSIS — M25511 Pain in right shoulder: Secondary | ICD-10-CM | POA: Diagnosis not present

## 2016-07-08 DIAGNOSIS — R531 Weakness: Secondary | ICD-10-CM | POA: Diagnosis not present

## 2016-07-08 DIAGNOSIS — M25611 Stiffness of right shoulder, not elsewhere classified: Secondary | ICD-10-CM | POA: Diagnosis not present

## 2016-07-08 DIAGNOSIS — M75121 Complete rotator cuff tear or rupture of right shoulder, not specified as traumatic: Secondary | ICD-10-CM | POA: Diagnosis not present

## 2016-08-01 ENCOUNTER — Other Ambulatory Visit: Payer: Self-pay | Admitting: Internal Medicine

## 2016-08-01 DIAGNOSIS — I10 Essential (primary) hypertension: Secondary | ICD-10-CM

## 2016-08-07 DIAGNOSIS — M5417 Radiculopathy, lumbosacral region: Secondary | ICD-10-CM | POA: Diagnosis not present

## 2016-08-07 DIAGNOSIS — M9903 Segmental and somatic dysfunction of lumbar region: Secondary | ICD-10-CM | POA: Diagnosis not present

## 2016-08-11 DIAGNOSIS — M9903 Segmental and somatic dysfunction of lumbar region: Secondary | ICD-10-CM | POA: Diagnosis not present

## 2016-08-11 DIAGNOSIS — M5417 Radiculopathy, lumbosacral region: Secondary | ICD-10-CM | POA: Diagnosis not present

## 2016-08-20 DIAGNOSIS — M9903 Segmental and somatic dysfunction of lumbar region: Secondary | ICD-10-CM | POA: Diagnosis not present

## 2016-08-20 DIAGNOSIS — M5417 Radiculopathy, lumbosacral region: Secondary | ICD-10-CM | POA: Diagnosis not present

## 2016-08-29 ENCOUNTER — Other Ambulatory Visit: Payer: Self-pay | Admitting: Internal Medicine

## 2016-09-16 ENCOUNTER — Other Ambulatory Visit (INDEPENDENT_AMBULATORY_CARE_PROVIDER_SITE_OTHER): Payer: BLUE CROSS/BLUE SHIELD

## 2016-09-16 ENCOUNTER — Encounter: Payer: Self-pay | Admitting: Internal Medicine

## 2016-09-16 ENCOUNTER — Ambulatory Visit (INDEPENDENT_AMBULATORY_CARE_PROVIDER_SITE_OTHER): Payer: BLUE CROSS/BLUE SHIELD | Admitting: Internal Medicine

## 2016-09-16 VITALS — BP 124/80 | HR 54 | Temp 98.4°F | Ht 64.0 in | Wt 143.8 lb

## 2016-09-16 DIAGNOSIS — I10 Essential (primary) hypertension: Secondary | ICD-10-CM

## 2016-09-16 DIAGNOSIS — Z0000 Encounter for general adult medical examination without abnormal findings: Secondary | ICD-10-CM | POA: Diagnosis not present

## 2016-09-16 DIAGNOSIS — Z23 Encounter for immunization: Secondary | ICD-10-CM

## 2016-09-16 DIAGNOSIS — Z1159 Encounter for screening for other viral diseases: Secondary | ICD-10-CM

## 2016-09-16 DIAGNOSIS — M5412 Radiculopathy, cervical region: Secondary | ICD-10-CM | POA: Diagnosis not present

## 2016-09-16 DIAGNOSIS — E785 Hyperlipidemia, unspecified: Secondary | ICD-10-CM

## 2016-09-16 DIAGNOSIS — M19011 Primary osteoarthritis, right shoulder: Secondary | ICD-10-CM

## 2016-09-16 DIAGNOSIS — E2839 Other primary ovarian failure: Secondary | ICD-10-CM | POA: Insufficient documentation

## 2016-09-16 LAB — TSH: TSH: 1.73 u[IU]/mL (ref 0.35–4.50)

## 2016-09-16 LAB — CBC WITH DIFFERENTIAL/PLATELET
BASOS ABS: 0 10*3/uL (ref 0.0–0.1)
Basophils Relative: 0.5 % (ref 0.0–3.0)
EOS ABS: 0.1 10*3/uL (ref 0.0–0.7)
Eosinophils Relative: 2 % (ref 0.0–5.0)
HEMATOCRIT: 40.5 % (ref 36.0–46.0)
HEMOGLOBIN: 13.6 g/dL (ref 12.0–15.0)
LYMPHS PCT: 33.5 % (ref 12.0–46.0)
Lymphs Abs: 2.4 10*3/uL (ref 0.7–4.0)
MCHC: 33.7 g/dL (ref 30.0–36.0)
MCV: 90.5 fl (ref 78.0–100.0)
MONOS PCT: 10.3 % (ref 3.0–12.0)
Monocytes Absolute: 0.7 10*3/uL (ref 0.1–1.0)
Neutro Abs: 3.9 10*3/uL (ref 1.4–7.7)
Neutrophils Relative %: 53.7 % (ref 43.0–77.0)
PLATELETS: 196 10*3/uL (ref 150.0–400.0)
RBC: 4.48 Mil/uL (ref 3.87–5.11)
RDW: 13.9 % (ref 11.5–15.5)
WBC: 7.2 10*3/uL (ref 4.0–10.5)

## 2016-09-16 LAB — COMPREHENSIVE METABOLIC PANEL
ALBUMIN: 4.6 g/dL (ref 3.5–5.2)
ALK PHOS: 71 U/L (ref 39–117)
ALT: 19 U/L (ref 0–35)
AST: 18 U/L (ref 0–37)
BILIRUBIN TOTAL: 0.8 mg/dL (ref 0.2–1.2)
BUN: 21 mg/dL (ref 6–23)
CALCIUM: 9.8 mg/dL (ref 8.4–10.5)
CO2: 30 meq/L (ref 19–32)
CREATININE: 0.92 mg/dL (ref 0.40–1.20)
Chloride: 102 mEq/L (ref 96–112)
GFR: 64.39 mL/min (ref >60.00)
Glucose, Bld: 91 mg/dL (ref 70–99)
Potassium: 4.2 mEq/L (ref 3.5–5.1)
Sodium: 140 mEq/L (ref 135–145)
Total Protein: 7.4 g/dL (ref 6.0–8.3)

## 2016-09-16 LAB — LIPID PANEL
CHOL/HDL RATIO: 3
CHOLESTEROL: 287 mg/dL — AB (ref 0–200)
HDL: 82.7 mg/dL (ref >39.00)
LDL Cholesterol: 181 mg/dL — ABNORMAL HIGH (ref 0–99)
NonHDL: 203.88
TRIGLYCERIDES: 113 mg/dL (ref 0.0–149.0)
VLDL: 22.6 mg/dL (ref 0.0–40.0)

## 2016-09-16 MED ORDER — HYDROCODONE-ACETAMINOPHEN 10-325 MG PO TABS: 1.0000 | ORAL_TABLET | Freq: Four times a day (QID) | ORAL | 0 refills | Status: DC | PRN

## 2016-09-16 MED ORDER — ZOSTER VACCINE LIVE 19400 UNT/0.65ML ~~LOC~~ SUSR: 0.6500 mL | Freq: Once | SUBCUTANEOUS | 0 refills | Status: AC

## 2016-09-16 NOTE — Progress Notes (Signed)
Pre visit review using our clinic review tool, if applicable. No additional management support is needed unless otherwise documented below in the visit note. 

## 2016-09-16 NOTE — Patient Instructions (Signed)
Preventive Care for Adults, Female A healthy lifestyle and preventive care can promote health and wellness. Preventive health guidelines for women include the following key practices.  A routine yearly physical is a good way to check with your health care provider about your health and preventive screening. It is a chance to share any concerns and updates on your health and to receive a thorough exam.  Visit your dentist for a routine exam and preventive care every 6 months. Brush your teeth twice a day and floss once a day. Good oral hygiene prevents tooth decay and gum disease.  The frequency of eye exams is based on your age, health, family medical history, use of contact lenses, and other factors. Follow your health care provider's recommendations for frequency of eye exams.  Eat a healthy diet. Foods like vegetables, fruits, whole grains, low-fat dairy products, and lean protein foods contain the nutrients you need without too many calories. Decrease your intake of foods high in solid fats, added sugars, and salt. Eat the right amount of calories for you.Get information about a proper diet from your health care provider, if necessary.  Regular physical exercise is one of the most important things you can do for your health. Most adults should get at least 150 minutes of moderate-intensity exercise (any activity that increases your heart rate and causes you to sweat) each week. In addition, most adults need muscle-strengthening exercises on 2 or more days a week.  Maintain a healthy weight. The body mass index (BMI) is a screening tool to identify possible weight problems. It provides an estimate of body fat based on height and weight. Your health care provider can find your BMI and can help you achieve or maintain a healthy weight.For adults 20 years and older:  A BMI below 18.5 is considered underweight.  A BMI of 18.5 to 24.9 is normal.  A BMI of 25 to 29.9 is considered overweight.  A  BMI of 30 and above is considered obese.  Maintain normal blood lipids and cholesterol levels by exercising and minimizing your intake of saturated fat. Eat a balanced diet with plenty of fruit and vegetables. Blood tests for lipids and cholesterol should begin at age 45 and be repeated every 5 years. If your lipid or cholesterol levels are high, you are over 50, or you are at high risk for heart disease, you may need your cholesterol levels checked more frequently.Ongoing high lipid and cholesterol levels should be treated with medicines if diet and exercise are not working.  If you smoke, find out from your health care provider how to quit. If you do not use tobacco, do not start.  Lung cancer screening is recommended for adults aged 45-80 years who are at high risk for developing lung cancer because of a history of smoking. A yearly low-dose CT scan of the lungs is recommended for people who have at least a 30-pack-year history of smoking and are a current smoker or have quit within the past 15 years. A pack year of smoking is smoking an average of 1 pack of cigarettes a day for 1 year (for example: 1 pack a day for 30 years or 2 packs a day for 15 years). Yearly screening should continue until the smoker has stopped smoking for at least 15 years. Yearly screening should be stopped for people who develop a health problem that would prevent them from having lung cancer treatment.  If you are pregnant, do not drink alcohol. If you are  breastfeeding, be very cautious about drinking alcohol. If you are not pregnant and choose to drink alcohol, do not have more than 1 drink per day. One drink is considered to be 12 ounces (355 mL) of beer, 5 ounces (148 mL) of wine, or 1.5 ounces (44 mL) of liquor.  Avoid use of street drugs. Do not share needles with anyone. Ask for help if you need support or instructions about stopping the use of drugs.  High blood pressure causes heart disease and increases the risk  of stroke. Your blood pressure should be checked at least every 1 to 2 years. Ongoing high blood pressure should be treated with medicines if weight loss and exercise do not work.  If you are 55-79 years old, ask your health care provider if you should take aspirin to prevent strokes.  Diabetes screening is done by taking a blood sample to check your blood glucose level after you have not eaten for a certain period of time (fasting). If you are not overweight and you do not have risk factors for diabetes, you should be screened once every 3 years starting at age 45. If you are overweight or obese and you are 40-70 years of age, you should be screened for diabetes every year as part of your cardiovascular risk assessment.  Breast cancer screening is essential preventive care for women. You should practice "breast self-awareness." This means understanding the normal appearance and feel of your breasts and may include breast self-examination. Any changes detected, no matter how small, should be reported to a health care provider. Women in their 20s and 30s should have a clinical breast exam (CBE) by a health care provider as part of a regular health exam every 1 to 3 years. After age 40, women should have a CBE every year. Starting at age 40, women should consider having a mammogram (breast X-ray test) every year. Women who have a family history of breast cancer should talk to their health care provider about genetic screening. Women at a high risk of breast cancer should talk to their health care providers about having an MRI and a mammogram every year.  Breast cancer gene (BRCA)-related cancer risk assessment is recommended for women who have family members with BRCA-related cancers. BRCA-related cancers include breast, ovarian, tubal, and peritoneal cancers. Having family members with these cancers may be associated with an increased risk for harmful changes (mutations) in the breast cancer genes BRCA1 and  BRCA2. Results of the assessment will determine the need for genetic counseling and BRCA1 and BRCA2 testing.  Your health care provider may recommend that you be screened regularly for cancer of the pelvic organs (ovaries, uterus, and vagina). This screening involves a pelvic examination, including checking for microscopic changes to the surface of your cervix (Pap test). You may be encouraged to have this screening done every 3 years, beginning at age 21.  For women ages 30-65, health care providers may recommend pelvic exams and Pap testing every 3 years, or they may recommend the Pap and pelvic exam, combined with testing for human papilloma virus (HPV), every 5 years. Some types of HPV increase your risk of cervical cancer. Testing for HPV may also be done on women of any age with unclear Pap test results.  Other health care providers may not recommend any screening for nonpregnant women who are considered low risk for pelvic cancer and who do not have symptoms. Ask your health care provider if a screening pelvic exam is right for   you.  If you have had past treatment for cervical cancer or a condition that could lead to cancer, you need Pap tests and screening for cancer for at least 20 years after your treatment. If Pap tests have been discontinued, your risk factors (such as having a new sexual partner) need to be reassessed to determine if screening should resume. Some women have medical problems that increase the chance of getting cervical cancer. In these cases, your health care provider may recommend more frequent screening and Pap tests.  Colorectal cancer can be detected and often prevented. Most routine colorectal cancer screening begins at the age of 50 years and continues through age 75 years. However, your health care provider may recommend screening at an earlier age if you have risk factors for colon cancer. On a yearly basis, your health care provider may provide home test kits to check  for hidden blood in the stool. Use of a small camera at the end of a tube, to directly examine the colon (sigmoidoscopy or colonoscopy), can detect the earliest forms of colorectal cancer. Talk to your health care provider about this at age 50, when routine screening begins. Direct exam of the colon should be repeated every 5-10 years through age 75 years, unless early forms of precancerous polyps or small growths are found.  People who are at an increased risk for hepatitis B should be screened for this virus. You are considered at high risk for hepatitis B if:  You were born in a country where hepatitis B occurs often. Talk with your health care provider about which countries are considered high risk.  Your parents were born in a high-risk country and you have not received a shot to protect against hepatitis B (hepatitis B vaccine).  You have HIV or AIDS.  You use needles to inject street drugs.  You live with, or have sex with, someone who has hepatitis B.  You get hemodialysis treatment.  You take certain medicines for conditions like cancer, organ transplantation, and autoimmune conditions.  Hepatitis C blood testing is recommended for all people born from 1945 through 1965 and any individual with known risks for hepatitis C.  Practice safe sex. Use condoms and avoid high-risk sexual practices to reduce the spread of sexually transmitted infections (STIs). STIs include gonorrhea, chlamydia, syphilis, trichomonas, herpes, HPV, and human immunodeficiency virus (HIV). Herpes, HIV, and HPV are viral illnesses that have no cure. They can result in disability, cancer, and death.  You should be screened for sexually transmitted illnesses (STIs) including gonorrhea and chlamydia if:  You are sexually active and are younger than 24 years.  You are older than 24 years and your health care provider tells you that you are at risk for this type of infection.  Your sexual activity has changed  since you were last screened and you are at an increased risk for chlamydia or gonorrhea. Ask your health care provider if you are at risk.  If you are at risk of being infected with HIV, it is recommended that you take a prescription medicine daily to prevent HIV infection. This is called preexposure prophylaxis (PrEP). You are considered at risk if:  You are sexually active and do not regularly use condoms or know the HIV status of your partner(s).  You take drugs by injection.  You are sexually active with a partner who has HIV.  Talk with your health care provider about whether you are at high risk of being infected with HIV. If   you choose to begin PrEP, you should first be tested for HIV. You should then be tested every 3 months for as long as you are taking PrEP.  Osteoporosis is a disease in which the bones lose minerals and strength with aging. This can result in serious bone fractures or breaks. The risk of osteoporosis can be identified using a bone density scan. Women ages 67 years and over and women at risk for fractures or osteoporosis should discuss screening with their health care providers. Ask your health care provider whether you should take a calcium supplement or vitamin D to reduce the rate of osteoporosis.  Menopause can be associated with physical symptoms and risks. Hormone replacement therapy is available to decrease symptoms and risks. You should talk to your health care provider about whether hormone replacement therapy is right for you.  Use sunscreen. Apply sunscreen liberally and repeatedly throughout the day. You should seek shade when your shadow is shorter than you. Protect yourself by wearing long sleeves, pants, a wide-brimmed hat, and sunglasses year round, whenever you are outdoors.  Once a month, do a whole body skin exam, using a mirror to look at the skin on your back. Tell your health care provider of new moles, moles that have irregular borders, moles that  are larger than a pencil eraser, or moles that have changed in shape or color.  Stay current with required vaccines (immunizations).  Influenza vaccine. All adults should be immunized every year.  Tetanus, diphtheria, and acellular pertussis (Td, Tdap) vaccine. Pregnant women should receive 1 dose of Tdap vaccine during each pregnancy. The dose should be obtained regardless of the length of time since the last dose. Immunization is preferred during the 27th-36th week of gestation. An adult who has not previously received Tdap or who does not know her vaccine status should receive 1 dose of Tdap. This initial dose should be followed by tetanus and diphtheria toxoids (Td) booster doses every 10 years. Adults with an unknown or incomplete history of completing a 3-dose immunization series with Td-containing vaccines should begin or complete a primary immunization series including a Tdap dose. Adults should receive a Td booster every 10 years.  Varicella vaccine. An adult without evidence of immunity to varicella should receive 2 doses or a second dose if she has previously received 1 dose. Pregnant females who do not have evidence of immunity should receive the first dose after pregnancy. This first dose should be obtained before leaving the health care facility. The second dose should be obtained 4-8 weeks after the first dose.  Human papillomavirus (HPV) vaccine. Females aged 13-26 years who have not received the vaccine previously should obtain the 3-dose series. The vaccine is not recommended for use in pregnant females. However, pregnancy testing is not needed before receiving a dose. If a female is found to be pregnant after receiving a dose, no treatment is needed. In that case, the remaining doses should be delayed until after the pregnancy. Immunization is recommended for any person with an immunocompromised condition through the age of 61 years if she did not get any or all doses earlier. During the  3-dose series, the second dose should be obtained 4-8 weeks after the first dose. The third dose should be obtained 24 weeks after the first dose and 16 weeks after the second dose.  Zoster vaccine. One dose is recommended for adults aged 30 years or older unless certain conditions are present.  Measles, mumps, and rubella (MMR) vaccine. Adults born  before 1957 generally are considered immune to measles and mumps. Adults born in 1957 or later should have 1 or more doses of MMR vaccine unless there is a contraindication to the vaccine or there is laboratory evidence of immunity to each of the three diseases. A routine second dose of MMR vaccine should be obtained at least 28 days after the first dose for students attending postsecondary schools, health care workers, or international travelers. People who received inactivated measles vaccine or an unknown type of measles vaccine during 1963-1967 should receive 2 doses of MMR vaccine. People who received inactivated mumps vaccine or an unknown type of mumps vaccine before 1979 and are at high risk for mumps infection should consider immunization with 2 doses of MMR vaccine. For females of childbearing age, rubella immunity should be determined. If there is no evidence of immunity, females who are not pregnant should be vaccinated. If there is no evidence of immunity, females who are pregnant should delay immunization until after pregnancy. Unvaccinated health care workers born before 1957 who lack laboratory evidence of measles, mumps, or rubella immunity or laboratory confirmation of disease should consider measles and mumps immunization with 2 doses of MMR vaccine or rubella immunization with 1 dose of MMR vaccine.  Pneumococcal 13-valent conjugate (PCV13) vaccine. When indicated, a person who is uncertain of his immunization history and has no record of immunization should receive the PCV13 vaccine. All adults 65 years of age and older should receive this  vaccine. An adult aged 19 years or older who has certain medical conditions and has not been previously immunized should receive 1 dose of PCV13 vaccine. This PCV13 should be followed with a dose of pneumococcal polysaccharide (PPSV23) vaccine. Adults who are at high risk for pneumococcal disease should obtain the PPSV23 vaccine at least 8 weeks after the dose of PCV13 vaccine. Adults older than 68 years of age who have normal immune system function should obtain the PPSV23 vaccine dose at least 1 year after the dose of PCV13 vaccine.  Pneumococcal polysaccharide (PPSV23) vaccine. When PCV13 is also indicated, PCV13 should be obtained first. All adults aged 65 years and older should be immunized. An adult younger than age 65 years who has certain medical conditions should be immunized. Any person who resides in a nursing home or long-term care facility should be immunized. An adult smoker should be immunized. People with an immunocompromised condition and certain other conditions should receive both PCV13 and PPSV23 vaccines. People with human immunodeficiency virus (HIV) infection should be immunized as soon as possible after diagnosis. Immunization during chemotherapy or radiation therapy should be avoided. Routine use of PPSV23 vaccine is not recommended for American Indians, [place] Natives, or people younger than 65 years unless there are medical conditions that require PPSV23 vaccine. When indicated, people who have unknown immunization and have no record of immunization should receive PPSV23 vaccine. One-time revaccination 5 years after the first dose of PPSV23 is recommended for people aged 19-64 years who have chronic kidney failure, nephrotic syndrome, asplenia, or immunocompromised conditions. People who received 1-2 doses of PPSV23 before age 65 years should receive another dose of PPSV23 vaccine at age 65 years or later if at least 5 years have passed since the previous dose. Doses of PPSV23 are not  needed for people immunized with PPSV23 at or after age 65 years.  Meningococcal vaccine. Adults with asplenia or persistent complement component deficiencies should receive 2 doses of quadrivalent meningococcal conjugate (MenACWY-D) vaccine. The doses should be obtained   at least 2 months apart. Microbiologists working with certain meningococcal bacteria, [place] recruits, people at risk during an outbreak, and people who travel to or live in countries with a high rate of meningitis should be immunized. A first-year college student up through age 34 years who is living in a residence hall should receive a dose if she did not receive a dose on or after her 16th birthday. Adults who have certain high-risk conditions should receive one or more doses of vaccine.  Hepatitis A vaccine. Adults who wish to be protected from this disease, have certain high-risk conditions, work with hepatitis A-infected animals, work in hepatitis A research labs, or travel to or work in countries with a high rate of hepatitis A should be immunized. Adults who were previously unvaccinated and who anticipate close contact with an international adoptee during the first 60 days after arrival in the [place] from a country with a high rate of hepatitis A should be immunized.  Hepatitis B vaccine. Adults who wish to be protected from this disease, have certain high-risk conditions, may be exposed to blood or other infectious body fluids, are household contacts or sex partners of hepatitis B positive people, are clients or workers in certain care facilities, or travel to or work in countries with a high rate of hepatitis B should be immunized.  Haemophilus influenzae type b (Hib) vaccine. A previously unvaccinated person with asplenia or sickle cell disease or having a scheduled splenectomy should receive 1 dose of Hib vaccine. Regardless of previous immunization, a recipient of a hematopoietic stem cell transplant should receive a  3-dose series 6-12 months after her successful transplant. Hib vaccine is not recommended for adults with HIV infection. Preventive Services / Frequency Ages 35 to 4 years  Blood pressure check.** / Every 3-5 years.  Lipid and cholesterol check.** / Every 5 years beginning at age 60.  Clinical breast exam.** / Every 3 years for women in their 71s and 10s.  BRCA-related cancer risk assessment.** / For women who have family members with a BRCA-related cancer (breast, ovarian, tubal, or peritoneal cancers).  Pap test.** / Every 2 years from ages 76 through 26. Every 3 years starting at age 61 through age 76 or 93 with a history of 3 consecutive normal Pap tests.  HPV screening.** / Every 3 years from ages 37 through ages 60 to 51 with a history of 3 consecutive normal Pap tests.  Hepatitis C blood test.** / For any individual with known risks for hepatitis C.  Skin self-exam. / Monthly.  Influenza vaccine. / Every year.  Tetanus, diphtheria, and acellular pertussis (Tdap, Td) vaccine.** / Consult your health care provider. Pregnant women should receive 1 dose of Tdap vaccine during each pregnancy. 1 dose of Td every 10 years.  Varicella vaccine.** / Consult your health care provider. Pregnant females who do not have evidence of immunity should receive the first dose after pregnancy.  HPV vaccine. / 3 doses over 6 months, if 93 and younger. The vaccine is not recommended for use in pregnant females. However, pregnancy testing is not needed before receiving a dose.  Measles, mumps, rubella (MMR) vaccine.** / You need at least 1 dose of MMR if you were born in 1957 or later. You may also need a 2nd dose. For females of childbearing age, rubella immunity should be determined. If there is no evidence of immunity, females who are not pregnant should be vaccinated. If there is no evidence of immunity, females who are  pregnant should delay immunization until after pregnancy.  Pneumococcal  13-valent conjugate (PCV13) vaccine.** / Consult your health care provider.  Pneumococcal polysaccharide (PPSV23) vaccine.** / 1 to 2 doses if you smoke cigarettes or if you have certain conditions.  Meningococcal vaccine.** / 1 dose if you are age 68 to 8 years and a Market researcher living in a residence hall, or have one of several medical conditions, you need to get vaccinated against meningococcal disease. You may also need additional booster doses.  Hepatitis A vaccine.** / Consult your health care provider.  Hepatitis B vaccine.** / Consult your health care provider.  Haemophilus influenzae type b (Hib) vaccine.** / Consult your health care provider. Ages 7 to 53 years  Blood pressure check.** / Every year.  Lipid and cholesterol check.** / Every 5 years beginning at age 25 years.  Lung cancer screening. / Every year if you are aged 11-80 years and have a 30-pack-year history of smoking and currently smoke or have quit within the past 15 years. Yearly screening is stopped once you have quit smoking for at least 15 years or develop a health problem that would prevent you from having lung cancer treatment.  Clinical breast exam.** / Every year after age 48 years.  BRCA-related cancer risk assessment.** / For women who have family members with a BRCA-related cancer (breast, ovarian, tubal, or peritoneal cancers).  Mammogram.** / Every year beginning at age 41 years and continuing for as long as you are in good health. Consult with your health care provider.  Pap test.** / Every 3 years starting at age 65 years through age 37 or 70 years with a history of 3 consecutive normal Pap tests.  HPV screening.** / Every 3 years from ages 72 years through ages 60 to 40 years with a history of 3 consecutive normal Pap tests.  Fecal occult blood test (FOBT) of stool. / Every year beginning at age 21 years and continuing until age 5 years. You may not need to do this test if you get  a colonoscopy every 10 years.  Flexible sigmoidoscopy or colonoscopy.** / Every 5 years for a flexible sigmoidoscopy or every 10 years for a colonoscopy beginning at age 35 years and continuing until age 48 years.  Hepatitis C blood test.** / For all people born from [birth year] through 1965 and any individual with known risks for hepatitis C.  Skin self-exam. / Monthly.  Influenza vaccine. / Every year.  Tetanus, diphtheria, and acellular pertussis (Tdap/Td) vaccine.** / Consult your health care provider. Pregnant women should receive 1 dose of Tdap vaccine during each pregnancy. 1 dose of Td every 10 years.  Varicella vaccine.** / Consult your health care provider. Pregnant females who do not have evidence of immunity should receive the first dose after pregnancy.  Zoster vaccine.** / 1 dose for adults aged 30 years or older.  Measles, mumps, rubella (MMR) vaccine.** / You need at least 1 dose of MMR if you were born in 1957 or later. You may also need a second dose. For females of childbearing age, rubella immunity should be determined. If there is no evidence of immunity, females who are not pregnant should be vaccinated. If there is no evidence of immunity, females who are pregnant should delay immunization until after pregnancy.  Pneumococcal 13-valent conjugate (PCV13) vaccine.** / Consult your health care provider.  Pneumococcal polysaccharide (PPSV23) vaccine.** / 1 to 2 doses if you smoke cigarettes or if you have certain conditions.  Meningococcal vaccine.** /  Consult your health care provider.  Hepatitis A vaccine.** / Consult your health care provider.  Hepatitis B vaccine.** / Consult your health care provider.  Haemophilus influenzae type b (Hib) vaccine.** / Consult your health care provider. Ages 64 years and over  Blood pressure check.** / Every year.  Lipid and cholesterol check.** / Every 5 years beginning at age 23 years.  Lung cancer screening. / Every year if you  are aged 16-80 years and have a 30-pack-year history of smoking and currently smoke or have quit within the past 15 years. Yearly screening is stopped once you have quit smoking for at least 15 years or develop a health problem that would prevent you from having lung cancer treatment.  Clinical breast exam.** / Every year after age 74 years.  BRCA-related cancer risk assessment.** / For women who have family members with a BRCA-related cancer (breast, ovarian, tubal, or peritoneal cancers).  Mammogram.** / Every year beginning at age 44 years and continuing for as long as you are in good health. Consult with your health care provider.  Pap test.** / Every 3 years starting at age 58 years through age 22 or 39 years with 3 consecutive normal Pap tests. Testing can be stopped between 65 and 70 years with 3 consecutive normal Pap tests and no abnormal Pap or HPV tests in the past 10 years.  HPV screening.** / Every 3 years from ages 64 years through ages 70 or 61 years with a history of 3 consecutive normal Pap tests. Testing can be stopped between 65 and 70 years with 3 consecutive normal Pap tests and no abnormal Pap or HPV tests in the past 10 years.  Fecal occult blood test (FOBT) of stool. / Every year beginning at age 40 years and continuing until age 27 years. You may not need to do this test if you get a colonoscopy every 10 years.  Flexible sigmoidoscopy or colonoscopy.** / Every 5 years for a flexible sigmoidoscopy or every 10 years for a colonoscopy beginning at age 7 years and continuing until age 32 years.  Hepatitis C blood test.** / For all people born from [birth year] through 1963 and any individual with known risks for hepatitis C.  Osteoporosis screening.** / A one-time screening for women ages 30 years and over and women at risk for fractures or osteoporosis.  Skin self-exam. / Monthly.  Influenza vaccine. / Every year.  Tetanus, diphtheria, and acellular pertussis (Tdap/Td)  vaccine.** / 1 dose of Td every 10 years.  Varicella vaccine.** / Consult your health care provider.  Zoster vaccine.** / 1 dose for adults aged 35 years or older.  Pneumococcal 13-valent conjugate (PCV13) vaccine.** / Consult your health care provider.  Pneumococcal polysaccharide (PPSV23) vaccine.** / 1 dose for all adults aged 46 years and older.  Meningococcal vaccine.** / Consult your health care provider.  Hepatitis A vaccine.** / Consult your health care provider.  Hepatitis B vaccine.** / Consult your health care provider.  Haemophilus influenzae type b (Hib) vaccine.** / Consult your health care provider. ** Family history and personal history of risk and conditions may change your health care provider's recommendations.   This information is not intended to replace advice given to you by your health care provider. Make sure you discuss any questions you have with your health care provider.   Document Released: 12/30/2001 Document Revised: 11/24/2014 Document Reviewed: 03/31/2011 Elsevier Interactive Patient Education [employer].

## 2016-09-16 NOTE — Progress Notes (Signed)
Subjective:  Patient ID: Pam Scott, female    DOB: 1945-11-21  Age: 70 y.o. MRN: [id number]  CC: Hypertension; Hyperlipidemia; and Annual Exam   HPI Pam Scott presents for AWV/CPX.  She complains of intermittent episodes of neck pain related to degenerative disc disease and requests a refill on Norco. She tells me the Norco helps control her pain which occurs intermittently and is not worsening. She denies any radiation of the pain towards her upper extremities and denies any paresthesias.  She tells me her blood pressure has been well controlled on hydrochlorothiazide. She has had no recent episodes of headache/blurred vision/chest pain/shortness of breath/palpitations/edema/fatigue.  Past Medical History:  Diagnosis Date  . Hyperlipidemia   . Hypertension   . Osteoporosis    No past surgical history on file.  reports that she has never smoked. She has never used smokeless tobacco. She reports that she does not drink alcohol or use drugs. family history includes Hypertension in her father. Allergies  Allergen Reactions  . Amlodipine     edema  . Simvastatin     myalgias  . Erythromycin     Outpatient Medications Prior to Visit  Medication Sig Dispense Refill  . bimatoprost (LUMIGAN) 0.03 % ophthalmic drops 1 drop at bedtime.      . Cholecalciferol (VITAMIN D3) 2000 UNITS TABS Take by mouth.      . citalopram (CELEXA) 20 MG tablet TAKE 1 TABLET DAILY 90 tablet 0  . estradiol (ESTRACE) 0.5 MG tablet TAKE 1 TABLET DAILY 90 tablet 2  . hydrochlorothiazide (MICROZIDE) 12.5 MG capsule TAKE 1 CAPSULE DAILY 90 capsule 1  . HYDROcodone-acetaminophen (NORCO) 10-325 MG tablet Take 1 tablet by mouth every 6 (six) hours as needed. 75 tablet 0  . LUMIGAN 0.01 % SOLN INSTILL 1 DROP INTO BOTH EYES EVERY NIGHT AS DIRECTED  5  . Diclofenac Sodium 2 % SOLN Apply twice daily. 112 g 1  . Doxepin HCl (SILENOR) 3 MG TABS Take 1 tablet (3 mg total) by mouth at bedtime as needed. 30  tablet 11   No facility-administered medications prior to visit.     ROS Review of Systems  Constitutional: Negative.  Negative for activity change, appetite change, chills, diaphoresis, fatigue and fever.  HENT: Negative.  Negative for sinus pressure and trouble swallowing.   Eyes: Negative.  Negative for visual disturbance.  Respiratory: Negative for cough, choking, chest tightness, shortness of breath and stridor.   Cardiovascular: Negative for chest pain, palpitations and leg swelling.  Gastrointestinal: Negative.  Negative for abdominal pain, blood in stool, constipation, diarrhea, nausea and vomiting.  Endocrine: Negative.   Genitourinary: Negative.  Negative for decreased urine volume, difficulty urinating and urgency.  Musculoskeletal: Positive for arthralgias and neck pain. Negative for back pain, myalgias and neck stiffness.  Skin: Negative.   Allergic/Immunologic: Negative.   Neurological: Negative.  Negative for dizziness, tremors, weakness and numbness.  Hematological: Negative.  Negative for adenopathy. Does not bruise/bleed easily.  Psychiatric/Behavioral: Negative.     Objective:  BP 124/80 (BP Location: Left Arm, Patient Position: Sitting, Cuff Size: Normal)   Pulse (!) 54   Temp 98.4 F (36.9 C) (Oral)   Ht 5\' 4"  (1.626 m)   Wt 143 lb 12 oz (65.2 kg)   SpO2 97%   BMI 24.67 kg/m   BP Readings from Last 3 Encounters:  09/16/16 124/80  11/22/15 120/86  11/22/15 122/82    Wt Readings from Last 3 Encounters:  09/16/16 143 lb 12 oz (65.2  kg)  11/22/15 145 lb (65.8 kg)  09/26/15 148 lb (67.1 kg)    Physical Exam  Constitutional: She is oriented to person, place, and time. No distress.  HENT:  Head: Normocephalic and atraumatic.  Mouth/Throat: Oropharynx is clear and moist. No oropharyngeal exudate.  Eyes: Conjunctivae are normal. Right eye exhibits no discharge. Left eye exhibits no discharge. No scleral icterus.  Neck: Normal range of motion. Neck  supple. No JVD present. No tracheal deviation present. No thyromegaly present.  Cardiovascular: Normal rate, regular rhythm, normal heart sounds and intact distal pulses.  Exam reveals no gallop and no friction rub.   No murmur heard. Pulmonary/Chest: Effort normal and breath sounds normal. No stridor. No respiratory distress. She has no wheezes. She has no rales. She exhibits no tenderness.  Abdominal: Soft. Bowel sounds are normal. She exhibits no distension and no mass. There is no tenderness. There is no rebound and no guarding.  Musculoskeletal: Normal range of motion. She exhibits no edema, tenderness or deformity.       Cervical back: Normal.  Lymphadenopathy:    She has no cervical adenopathy.  Neurological: She is oriented to person, place, and time. She displays no atrophy, no tremor and normal reflexes. No cranial nerve deficit or sensory deficit. She exhibits normal muscle tone. She displays no seizure activity.  Skin: Skin is warm and dry. No rash noted. She is not diaphoretic. No erythema. No pallor.  Psychiatric: She has a normal mood and affect. Her behavior is normal. Judgment and thought content normal.  Vitals reviewed.   Lab Results  Component Value Date   WBC 7.2 09/16/2016   HGB 13.6 09/16/2016   HCT 40.5 09/16/2016   PLT 196.0 09/16/2016   GLUCOSE 91 09/16/2016   CHOL 287 (H) 09/16/2016   TRIG 113.0 09/16/2016   HDL 82.70 09/16/2016   LDLDIRECT 159.9 06/23/2013   LDLCALC 181 (H) 09/16/2016   ALT 19 09/16/2016   AST 18 09/16/2016   NA 140 09/16/2016   K 4.2 09/16/2016   CL 102 09/16/2016   CREATININE 0.92 09/16/2016   BUN 21 09/16/2016   CO2 30 09/16/2016   TSH 1.73 09/16/2016    Mm Screening Breast Tomo Bilateral  Result Date: 07/01/2016 CLINICAL DATA:  Screening. EXAM: 2D DIGITAL SCREENING BILATERAL MAMMOGRAM WITH CAD AND ADJUNCT TOMO COMPARISON:  Previous exam(s). ACR Breast Density Category b: There are scattered areas of fibroglandular density.  FINDINGS: There are no findings suspicious for malignancy. Images were processed with CAD. IMPRESSION: No mammographic evidence of malignancy. A result letter of this screening mammogram will be mailed directly to the patient. RECOMMENDATION: Screening mammogram in one year. (Code:SM-B-01Y) BI-RADS CATEGORY  1: Negative. Electronically Signed   By: Lajean Manes M.D.   On: 07/01/2016 16:46    Assessment & Plan:   Curtistine was seen today for hypertension, hyperlipidemia and annual exam.  Diagnoses and all orders for this visit:  Essential hypertension, benign- her blood pressure is well-controlled, electrolytes and renal function are stable. -     Comprehensive metabolic panel; Future -     CBC with Differential/Platelet; Future  Hyperlipidemia with target LDL less than 130- her LDL is elevated but she has a low Framingham risk score due to her high HDL so I do not recommend a statin at this time -     Lipid panel; Future -     TSH; Future  Primary osteoarthritis of right shoulder -     HYDROcodone-acetaminophen (NORCO) 10-325 MG tablet;  Take 1 tablet by mouth every 6 (six) hours as needed.  Cervical radiculitis -     HYDROcodone-acetaminophen (NORCO) 10-325 MG tablet; Take 1 tablet by mouth every 6 (six) hours as needed.  Routine general medical examination at a health care facility -     Zoster Vaccine Live, PF, (ZOSTAVAX) 29562 UNT/0.65ML injection; Inject 19,400 Units into the skin once.  Need for hepatitis C screening test -     Hepatitis C antibody; Future  Estrogen deficiency -     DG Bone Density; Future  Need for prophylactic vaccination and inoculation against influenza -     Flu vaccine HIGH DOSE PF (Fluzone High dose)   I have discontinued Ms. Riederer's Diclofenac Sodium, Doxepin HCl, and LUMIGAN. I am also having her start on Zoster Vaccine Live (PF). Additionally, I am having her maintain her bimatoprost, Vitamin D3, hydrochlorothiazide, estradiol, citalopram, and  HYDROcodone-acetaminophen.  Meds ordered this encounter  Medications  . HYDROcodone-acetaminophen (NORCO) 10-325 MG tablet    Sig: Take 1 tablet by mouth every 6 (six) hours as needed.    Dispense:  75 tablet    Refill:  0  . Zoster Vaccine Live, PF, (ZOSTAVAX) 13086 UNT/0.65ML injection    Sig: Inject 19,400 Units into the skin once.    Dispense:  1 each    Refill:  0   See AVS for instructions about healthy living and anticipatory guidance.  Follow-up: Return in about 6 months (around 03/16/2017).  Scarlette Calico, MD

## 2016-09-17 ENCOUNTER — Encounter: Payer: Self-pay | Admitting: Internal Medicine

## 2016-09-17 LAB — HEPATITIS C ANTIBODY: HCV AB: NEGATIVE

## 2016-09-18 NOTE — Assessment & Plan Note (Signed)

## 2016-10-23 ENCOUNTER — Ambulatory Visit (INDEPENDENT_AMBULATORY_CARE_PROVIDER_SITE_OTHER)
Admission: RE | Admit: 2016-10-23 | Discharge: 2016-10-23 | Disposition: A | Discharge status: Home / Self Care | Payer: BLUE CROSS/BLUE SHIELD | Source: Ambulatory Visit | Attending: Internal Medicine | Admitting: Internal Medicine

## 2016-10-23 DIAGNOSIS — E2839 Other primary ovarian failure: Secondary | ICD-10-CM | POA: Diagnosis not present

## 2016-10-27 ENCOUNTER — Encounter: Payer: Self-pay | Admitting: Internal Medicine

## 2016-10-27 DIAGNOSIS — E2839 Other primary ovarian failure: Secondary | ICD-10-CM | POA: Diagnosis not present

## 2016-10-27 LAB — HM PAP SMEAR

## 2016-10-29 ENCOUNTER — Telehealth: Payer: Self-pay

## 2016-10-29 NOTE — Telephone Encounter (Signed)
Pt Health form faxed, copy to scan and original placed in the mail.

## 2016-10-29 NOTE — Telephone Encounter (Signed)
Pt informed of same.  

## 2016-11-13 ENCOUNTER — Telehealth: Payer: Self-pay

## 2016-11-13 ENCOUNTER — Telehealth: Payer: Self-pay | Admitting: *Deleted

## 2016-11-13 DIAGNOSIS — M19011 Primary osteoarthritis, right shoulder: Secondary | ICD-10-CM

## 2016-11-13 DIAGNOSIS — M5412 Radiculopathy, cervical region: Secondary | ICD-10-CM

## 2016-11-13 MED ORDER — HYDROCODONE-ACETAMINOPHEN 10-325 MG PO TABS: 1.0000 | ORAL_TABLET | Freq: Four times a day (QID) | ORAL | 0 refills | Status: DC | PRN

## 2016-11-13 NOTE — Telephone Encounter (Signed)
Notified pt rx ready for pick-up.../lmb 

## 2016-11-13 NOTE — Telephone Encounter (Signed)
Rec'd call pt requesting ro have refill on her Hydrocodone. MD is out of the office pls advise...[name]

## 2016-11-13 NOTE — Telephone Encounter (Signed)
Done hardcopy to Corinne  

## 2016-11-13 NOTE — Telephone Encounter (Signed)
-----   Message from Earnstine Regal, [place] sent at 11/13/2016  1:05 PM EST ----- Regarding: Health form Contact: [phone] Hey Step... Mrs. Imbriano is wanting to speak with you concerning the health form that was filled out

## 2016-11-13 NOTE — Telephone Encounter (Signed)
Pt stated that the nicotine use was not marked. Reprinted and refaxed.  [phone] is the fax number to refax it to.

## 2016-12-08 ENCOUNTER — Other Ambulatory Visit: Payer: Self-pay | Admitting: Internal Medicine

## 2017-01-02 ENCOUNTER — Other Ambulatory Visit: Payer: Self-pay | Admitting: *Deleted

## 2017-01-02 NOTE — Telephone Encounter (Signed)
Left msg on triage requesting refill on her Hydrocodone.../lmb 

## 2017-01-05 ENCOUNTER — Other Ambulatory Visit: Payer: Self-pay | Admitting: Internal Medicine

## 2017-01-05 DIAGNOSIS — M19011 Primary osteoarthritis, right shoulder: Secondary | ICD-10-CM

## 2017-01-05 DIAGNOSIS — M5412 Radiculopathy, cervical region: Secondary | ICD-10-CM

## 2017-01-05 MED ORDER — HYDROCODONE-ACETAMINOPHEN 10-325 MG PO TABS: 1.0000 | ORAL_TABLET | Freq: Four times a day (QID) | ORAL | 0 refills | Status: DC | PRN

## 2017-01-05 NOTE — Telephone Encounter (Signed)
Pt informed rx is ready for pick up

## 2017-01-05 NOTE — Telephone Encounter (Signed)
RX written 

## 2017-01-07 ENCOUNTER — Ambulatory Visit: Payer: BLUE CROSS/BLUE SHIELD | Admitting: Internal Medicine

## 2017-01-07 DIAGNOSIS — H401132 Primary open-angle glaucoma, bilateral, moderate stage: Secondary | ICD-10-CM | POA: Diagnosis not present

## 2017-03-03 ENCOUNTER — Ambulatory Visit (INDEPENDENT_AMBULATORY_CARE_PROVIDER_SITE_OTHER): Payer: BLUE CROSS/BLUE SHIELD | Admitting: Internal Medicine

## 2017-03-03 ENCOUNTER — Encounter: Payer: Self-pay | Admitting: Internal Medicine

## 2017-03-03 ENCOUNTER — Other Ambulatory Visit (INDEPENDENT_AMBULATORY_CARE_PROVIDER_SITE_OTHER): Payer: BLUE CROSS/BLUE SHIELD

## 2017-03-03 ENCOUNTER — Telehealth: Payer: Self-pay

## 2017-03-03 VITALS — BP 130/70 | HR 72 | Temp 98.0°F | Resp 16 | Ht 64.0 in | Wt 148.8 lb

## 2017-03-03 DIAGNOSIS — M19011 Primary osteoarthritis, right shoulder: Secondary | ICD-10-CM

## 2017-03-03 DIAGNOSIS — M5412 Radiculopathy, cervical region: Secondary | ICD-10-CM

## 2017-03-03 DIAGNOSIS — M797 Fibromyalgia: Secondary | ICD-10-CM | POA: Diagnosis not present

## 2017-03-03 DIAGNOSIS — M818 Other osteoporosis without current pathological fracture: Secondary | ICD-10-CM

## 2017-03-03 DIAGNOSIS — I10 Essential (primary) hypertension: Secondary | ICD-10-CM | POA: Diagnosis not present

## 2017-03-03 LAB — MAGNESIUM: MAGNESIUM: 2 mg/dL (ref 1.5–2.5)

## 2017-03-03 LAB — CBC WITH DIFFERENTIAL/PLATELET
BASOS ABS: 0 10*3/uL (ref 0.0–0.1)
BASOS PCT: 0.7 % (ref 0.0–3.0)
EOS PCT: 1.7 % (ref 0.0–5.0)
Eosinophils Absolute: 0.1 10*3/uL (ref 0.0–0.7)
HCT: 41.3 % (ref 36.0–46.0)
Hemoglobin: 13.9 g/dL (ref 12.0–15.0)
LYMPHS ABS: 2.3 10*3/uL (ref 0.7–4.0)
Lymphocytes Relative: 30.3 % (ref 12.0–46.0)
MCHC: 33.6 g/dL (ref 30.0–36.0)
MCV: 92.2 fl (ref 78.0–100.0)
MONO ABS: 0.7 10*3/uL (ref 0.1–1.0)
Monocytes Relative: 9.8 % (ref 3.0–12.0)
NEUTROS PCT: 57.5 % (ref 43.0–77.0)
Neutro Abs: 4.3 10*3/uL (ref 1.4–7.7)
Platelets: 186 10*3/uL (ref 150.0–400.0)
RBC: 4.48 Mil/uL (ref 3.87–5.11)
RDW: 14.3 % (ref 11.5–15.5)
WBC: 7.6 10*3/uL (ref 4.0–10.5)

## 2017-03-03 LAB — COMPREHENSIVE METABOLIC PANEL
ALT: 23 U/L (ref 0–35)
AST: 20 U/L (ref 0–37)
Albumin: 4.4 g/dL (ref 3.5–5.2)
Alkaline Phosphatase: 56 U/L (ref 39–117)
BUN: 16 mg/dL (ref 6–23)
CHLORIDE: 105 meq/L (ref 96–112)
CO2: 27 meq/L (ref 19–32)
CREATININE: 0.94 mg/dL (ref 0.40–1.20)
Calcium: 9.4 mg/dL (ref 8.4–10.5)
GFR: 62.73 mL/min (ref >60.00)
GLUCOSE: 84 mg/dL (ref 70–99)
POTASSIUM: 4 meq/L (ref 3.5–5.1)
SODIUM: 138 meq/L (ref 135–145)
Total Bilirubin: 0.5 mg/dL (ref 0.2–1.2)
Total Protein: 7.3 g/dL (ref 6.0–8.3)

## 2017-03-03 LAB — C-REACTIVE PROTEIN: CRP: 0.2 mg/dL — AB (ref 0.5–20.0)

## 2017-03-03 LAB — SEDIMENTATION RATE: SED RATE: 5 mm/h (ref 0–30)

## 2017-03-03 LAB — VITAMIN D 25 HYDROXY (VIT D DEFICIENCY, FRACTURES): VITD: 36.46 ng/mL (ref 30.00–100.00)

## 2017-03-03 LAB — CK: CK TOTAL: 217 U/L — AB (ref 7–177)

## 2017-03-03 MED ORDER — HYDROCODONE-ACETAMINOPHEN 10-325 MG PO TABS: 1.0000 | ORAL_TABLET | Freq: Four times a day (QID) | ORAL | 0 refills | Status: DC | PRN

## 2017-03-03 MED ORDER — PREGABALIN 75 MG PO CAPS: 75.0000 mg | ORAL_CAPSULE | Freq: Three times a day (TID) | ORAL | 5 refills | Status: DC

## 2017-03-03 NOTE — Telephone Encounter (Signed)
Order [id number]

## 2017-03-03 NOTE — Progress Notes (Signed)
Pre visit review using our clinic review tool, if applicable. No additional management support is needed unless otherwise documented below in the visit note. 

## 2017-03-03 NOTE — Patient Instructions (Signed)
Myofascial Pain Syndrome and Fibromyalgia Myofascial pain syndrome and fibromyalgia are both pain disorders. This pain may be felt mainly in your muscles.  Myofascial pain syndrome:  Always has trigger points or tender points in the muscle that will cause pain when pressed. The pain may come and go.  Usually affects your neck, upper back, and shoulder areas. The pain often radiates into your arms and hands.  Fibromyalgia:  Has muscle pains and tenderness that come and go.  Is often associated with fatigue and sleep disturbances.  Has trigger points.  Tends to be long-lasting (chronic), but is not life-threatening. Fibromyalgia and myofascial pain are not the same. However, they often occur together. If you have both conditions, each can make the other worse. Both are common and can cause enough pain and fatigue to make day-to-day activities difficult. What are the causes? The exact causes of fibromyalgia and myofascial pain are not known. People with certain gene types may be more likely to develop fibromyalgia. Some factors can be triggers for both conditions, such as:  Spine disorders.  Arthritis.  Severe injury (trauma) and other physical stressors.  Being under a lot of stress.  A medical illness. What are the signs or symptoms? Fibromyalgia  The main symptom of fibromyalgia is widespread pain and tenderness in your muscles. This can vary over time. Pain is sometimes described as stabbing, shooting, or burning. You may have tingling or numbness, too. You may also have sleep problems and fatigue. You may wake up feeling tired and groggy (fibro fog). Other symptoms may include:  Bowel and bladder problems.  Headaches.  Visual problems.  Problems with odors and noises.  Depression or mood changes.  Painful menstrual periods (dysmenorrhea).  Dry skin or eyes. Myofascial pain syndrome  Symptoms of myofascial pain syndrome include:  Tight, ropy bands of  muscle.  Uncomfortable sensations in muscular areas, such as:  Aching.  Cramping.  Burning.  Numbness.  Tingling.  Muscle weakness.  Trouble moving certain muscles freely (range of motion). How is this diagnosed? There are no specific tests to diagnose fibromyalgia or myofascial pain syndrome. Both can be hard to diagnose because their symptoms are common in many other conditions. Your health care provider may suspect one or both of these conditions based on your symptoms and medical history. Your health care provider will also do a physical exam. The key to diagnosing fibromyalgia is having pain, fatigue, and other symptoms for more than three months that cannot be explained by another condition. The key to diagnosing myofascial pain syndrome is finding trigger points in muscles that are tender and cause pain elsewhere in your body (referred pain). How is this treated? Treating fibromyalgia and myofascial pain often requires a team of health care providers. This usually starts with your primary provider and a physical therapist. You may also find it helpful to work with alternative health care providers, such as massage therapists or acupuncturists. Treatment for fibromyalgia may include medicines. This may include nonsteroidal anti-inflammatory drugs (NSAIDs), along with other medicines. Treatment for myofascial pain may also include:  NSAIDs.  Cooling and stretching of muscles.  Trigger point injections.  Sound wave (ultrasound) treatments to stimulate muscles. Follow these instructions at home:  Take medicines only as directed by your health care provider.  Exercise as directed by your health care provider or physical therapist.  Try to avoid stressful situations.  Practice relaxation techniques to control your stress. You may want to try:  Biofeedback.  Visual imagery.  Hypnosis.    Muscle relaxation.  Yoga.  Meditation.  Talk to your health care provider  about alternative treatments, such as acupuncture or massage treatment.  Maintain a healthy lifestyle. This includes eating a healthy diet and getting enough sleep.  Consider joining a support group.  Do not do activities that stress or strain your muscles. That includes repetitive motions and heavy lifting. Where to find more information:  National Fibromyalgia Association: www.fmaware.org  Arthritis Foundation: www.arthritis.org  American Chronic Pain Association: www.theacpa.org/condition/myofascial-pain Contact a health care provider if:  You have new symptoms.  Your symptoms get worse.  You have side effects from your medicines.  You have trouble sleeping.  Your condition is causing depression or anxiety. This information is not intended to replace advice given to you by your health care provider. Make sure you discuss any questions you have with your health care provider. Document Released: 11/03/2005 Document Revised: 04/10/2016 Document Reviewed: 08/09/2014 Elsevier Interactive Patient Education  2017 [employer]  

## 2017-03-03 NOTE — Progress Notes (Signed)
Subjective:  Patient ID: Pam Scott, female    DOB: 1946-02-20  Age: 71 y.o. MRN: [id number]  CC: Hypertension   HPI Pam Scott presents for a blood pressure check. She complains that her muscles hurt all over diffusely and symmetrically. She also has joint pain located in her hands, wrists, shoulders and she suffers from chronic neck and low back pain. She is getting some symptom relief with Norco. She has a history of fibromyalgia and wonders if Lyrica would help. Her symptoms are improved with rest and activity. She also complains of chronic fatigue but denies headache/chest pain/shortness of breath/diaphoresis/abdominal pain/weight loss/diarrhea/constipation.  Outpatient Medications Prior to Visit  Medication Sig Dispense Refill  . bimatoprost (LUMIGAN) 0.03 % ophthalmic drops 1 drop at bedtime.      . Cholecalciferol (VITAMIN D3) 2000 UNITS TABS Take by mouth.      . citalopram (CELEXA) 20 MG tablet TAKE 1 TABLET DAILY 90 tablet 3  . estradiol (ESTRACE) 0.5 MG tablet TAKE 1 TABLET DAILY 90 tablet 2  . hydrochlorothiazide (MICROZIDE) 12.5 MG capsule TAKE 1 CAPSULE DAILY 90 capsule 1  . HYDROcodone-acetaminophen (NORCO) 10-325 MG tablet Take 1 tablet by mouth every 6 (six) hours as needed. 75 tablet 0   No facility-administered medications prior to visit.     ROS Review of Systems  Constitutional: Positive for fatigue. Negative for activity change, appetite change, chills, diaphoresis, fever and unexpected weight change.  HENT: Negative.  Negative for trouble swallowing.   Eyes: Negative for visual disturbance.  Respiratory: Negative for cough, chest tightness, shortness of breath and wheezing.   Cardiovascular: Negative for chest pain, palpitations and leg swelling.  Gastrointestinal: Negative for abdominal pain, constipation, diarrhea, nausea and vomiting.  Endocrine: Negative.  Negative for cold intolerance and heat intolerance.  Genitourinary: Negative.  Negative  for difficulty urinating.  Musculoskeletal: Positive for arthralgias, back pain, myalgias, neck pain and neck stiffness. Negative for gait problem and joint swelling.  Skin: Negative.   Allergic/Immunologic: Negative.   Neurological: Negative.  Negative for dizziness, weakness, numbness and headaches.  Hematological: Negative for adenopathy. Does not bruise/bleed easily.  Psychiatric/Behavioral: Negative.  Negative for decreased concentration, dysphoric mood and sleep disturbance. The patient is not nervous/anxious.     Objective:  BP 130/70 (BP Location: Left Arm, Patient Position: Sitting, Cuff Size: Normal)   Pulse 72   Temp 98 F (36.7 C) (Oral)   Resp 16   Ht 5\' 4"  (1.626 m)   Wt 148 lb 12 oz (67.5 kg)   SpO2 97%   BMI 25.53 kg/m   BP Readings from Last 3 Encounters:  03/03/17 130/70  09/16/16 124/80  11/22/15 120/86    Wt Readings from Last 3 Encounters:  03/03/17 148 lb 12 oz (67.5 kg)  09/16/16 143 lb 12 oz (65.2 kg)  11/22/15 145 lb (65.8 kg)    Physical Exam  Constitutional: No distress.  HENT:  Mouth/Throat: Oropharynx is clear and moist. No oropharyngeal exudate.  Eyes: Conjunctivae are normal. Right eye exhibits no discharge. Left eye exhibits no discharge. No scleral icterus.  Neck: Normal range of motion. Neck supple. No JVD present. No tracheal deviation present. No thyromegaly present.  Cardiovascular: Normal rate, regular rhythm, normal heart sounds and intact distal pulses.  Exam reveals no gallop and no friction rub.   No murmur heard. Pulmonary/Chest: Effort normal and breath sounds normal. No respiratory distress. She has no wheezes. She has no rales. She exhibits no tenderness.  Abdominal: Soft. Bowel sounds are  normal. She exhibits no distension and no mass. There is no tenderness. There is no rebound and no guarding.  Musculoskeletal: Normal range of motion. She exhibits no edema, tenderness or deformity.  Her joints overall cool with no synovitis,  tenderness, or swelling  Lymphadenopathy:    She has no cervical adenopathy.  Neurological: She is alert. She has normal reflexes. She displays no atrophy, no tremor and normal reflexes. No cranial nerve deficit or sensory deficit. She exhibits normal muscle tone. She displays no seizure activity. Coordination and gait normal.  Her muscle strength is normal, there is no proximal muscle weakness, her neurologic exam is nonfocal  Skin: Skin is warm and dry. No rash noted. She is not diaphoretic. No erythema. No pallor.  Psychiatric: She has a normal mood and affect. Her behavior is normal. Judgment and thought content normal.  Vitals reviewed.   Lab Results  Component Value Date   WBC 7.6 03/03/2017   HGB 13.9 03/03/2017   HCT 41.3 03/03/2017   PLT 186.0 03/03/2017   GLUCOSE 84 03/03/2017   CHOL 287 (H) 09/16/2016   TRIG 113.0 09/16/2016   HDL 82.70 09/16/2016   LDLDIRECT 159.9 06/23/2013   LDLCALC 181 (H) 09/16/2016   ALT 23 03/03/2017   AST 20 03/03/2017   NA 138 03/03/2017   K 4.0 03/03/2017   CL 105 03/03/2017   CREATININE 0.94 03/03/2017   BUN 16 03/03/2017   CO2 27 03/03/2017   TSH 1.73 09/16/2016    Dg Bone Density  Result Date: 10/27/2016 Date of study: 10/24/2016 Exam: DUAL X-RAY ABSORPTIOMETRY (DXA) FOR BONE MINERAL DENSITY (BMD) Instrument: [employer] Requesting Provider: PCP Indication: follow up for osteoporosis Comparison: 04/29/2012 Clinical data: Pt is a 71 y.o. female without previous history of fracture. On vitamin D, estradiol. Results:  Lumbar spine (L1-L4) Femoral neck (FN) T-score -1.9  RFN:-1.4 LFN:-1.1  Change in BMD from previous DXA test (%) +0.8%  +3.6%  (*) statistically significant Assessment: the BMD is low according to the [facility] classification for osteoporosis (see below). Fracture risk: moderate FRAX score: Not calculated as patient is on estrogen treatment. Comments: the technical quality of the study is good. Evaluation for secondary causes  should be considered if clinically indicated. Recommend optimizing calcium (1200 mg/day) and vitamin D (800 IU/day) intake. Followup: Repeat BMD is appropriate after 2 years. WHO criteria for diagnosis of osteoporosis in postmenopausal women and in men 74 y/o or older: - normal: T-score -1.0 to + 1.0 - osteopenia/low bone density: T-score between -2.5 and -1.0 - osteoporosis: T-score below -2.5 - severe osteoporosis: T-score below -2.5 with history of fragility fracture Note: although not part of the WHO classification, the presence of a fragility fracture, regardless of the T-score, should be considered diagnostic of osteoporosis, provided other causes for the fracture have been excluded. Treatment: The National Osteoporosis Foundation recommends that treatment be considered in postmenopausal women and men age 42 or older with: 1. Hip or vertebral (clinical or morphometric) fracture 2. T-score of - 2.5 or lower at the spine or hip 3. 10-year fracture probability by FRAX of at least 20% for a major osteoporotic fracture and 3% for a hip fracture Philemon Kingdom, MD [facility]    Assessment & Plan:   Pam Scott was seen today for hypertension.  Diagnoses and all orders for this visit:  Fibromyalgia muscle pain- her labs are negative for any evidence of inflammatory disease or myositis/myopathy. Her electrolytes are normal and her CBC is normal. Her vitamin  D level is in the low normal range. Her symptoms are consistent with fibromyalgia which is partially treated with Norco but will try Lyrica again, starting at a low dose and gradually increasing the dose over the next few months. -     Magnesium; Future -     Sedimentation rate; Future -     C-reactive protein; Future -     CK; Future -     VITAMIN D 25 Hydroxy (Vit-D Deficiency, Fractures); Future  Essential hypertension, benign- her blood pressure is adequately well controlled, electrolytes and renal function are normal. -      Comprehensive metabolic panel; Future -     CBC with Differential/Platelet; Future -     Magnesium; Future  Other osteoporosis without current pathological fracture -     VITAMIN D 25 Hydroxy (Vit-D Deficiency, Fractures); Future   I have discontinued Pam Scott's pregabalin. I am also having her start on pregabalin. Additionally, I am having her maintain her bimatoprost, Vitamin D3, hydrochlorothiazide, estradiol, citalopram, and HYDROcodone-acetaminophen.  Meds ordered this encounter  Medications  . pregabalin (LYRICA) 75 MG capsule    Sig: Take 1 capsule (75 mg total) by mouth 3 (three) times daily.    Dispense:  90 capsule    Refill:  5  . HYDROcodone-acetaminophen (NORCO) 10-325 MG tablet    Sig: Take 1 tablet by mouth every 6 (six) hours as needed.    Dispense:  75 tablet    Refill:  0     Follow-up: Return in about 6 weeks (around 04/14/2017).  Scarlette Calico, MD

## 2017-03-04 DIAGNOSIS — M5417 Radiculopathy, lumbosacral region: Secondary | ICD-10-CM | POA: Diagnosis not present

## 2017-03-04 DIAGNOSIS — M9903 Segmental and somatic dysfunction of lumbar region: Secondary | ICD-10-CM | POA: Diagnosis not present

## 2017-03-20 ENCOUNTER — Encounter: Payer: Self-pay | Admitting: Podiatry

## 2017-03-20 ENCOUNTER — Ambulatory Visit: Payer: BLUE CROSS/BLUE SHIELD | Admitting: Podiatry

## 2017-03-20 ENCOUNTER — Ambulatory Visit (INDEPENDENT_AMBULATORY_CARE_PROVIDER_SITE_OTHER): Payer: BLUE CROSS/BLUE SHIELD

## 2017-03-20 ENCOUNTER — Ambulatory Visit (INDEPENDENT_AMBULATORY_CARE_PROVIDER_SITE_OTHER): Payer: BLUE CROSS/BLUE SHIELD | Admitting: Podiatry

## 2017-03-20 VITALS — BP 148/113 | HR 69 | Resp 16 | Ht 64.0 in | Wt 148.0 lb

## 2017-03-20 DIAGNOSIS — M7752 Other enthesopathy of left foot: Secondary | ICD-10-CM

## 2017-03-20 DIAGNOSIS — M779 Enthesopathy, unspecified: Secondary | ICD-10-CM

## 2017-03-20 DIAGNOSIS — M79672 Pain in left foot: Secondary | ICD-10-CM

## 2017-03-20 DIAGNOSIS — M778 Other enthesopathies, not elsewhere classified: Secondary | ICD-10-CM

## 2017-03-20 MED ORDER — TRIAMCINOLONE ACETONIDE 10 MG/ML IJ SUSP
10.0000 mg | Freq: Once | INTRAMUSCULAR | Status: AC
  Administered 2017-03-20: 10 mg

## 2017-03-20 NOTE — Progress Notes (Signed)
   Subjective:    Patient ID: Pam Scott, female    DOB: 1946-05-30, 71 y.o.   MRN: [id number]  HPI Chief Complaint  Patient presents with  . Foot Pain    Left foot; plantar forefoot; pt stated, "has a burning sensation; hurts to walk; pain has been going on since March 2018"      Review of Systems  Musculoskeletal: Positive for back pain, gait problem and myalgias.  Hematological: Bruises/bleeds easily.  All other systems reviewed and are negative.      Objective:   Physical Exam        Assessment & Plan:

## 2017-03-21 NOTE — Progress Notes (Signed)
Subjective:    Patient ID: Linward Foster, female   DOB: 71 y.o.   MRN: [id number]   HPI patient presents stating she went to [place] last month and walked in flip-flops and flat shoes and developed a lot of forefoot pain left it's not gotten better despite reduced activity and shoe gear modification    Review of Systems  All other systems reviewed and are negative.       Objective:  Physical Exam  Constitutional: She is oriented to person, place, and time.  Cardiovascular: Intact distal pulses.   Musculoskeletal: Normal range of motion.  Neurological: She is alert and oriented to person, place, and time.  Skin: Skin is warm.  Nursing note and vitals reviewed.  neurovascular status intact muscle strength was adequate range of motion within normal limits with exquisite discomfort and fluid buildup around the third metatarsal phalangeal joint left with mild discomfort in the second metatarsophalangeal joint left very painful and pressed making shoe gear and ambulation difficult     Assessment:   Inflammatory capsulitis third MPJ brought on by injury when she walked in flip-flops for extensive period of time      Plan:    H&P condition reviewed and at this point I recommended careful steroid injection explaining risk. I did proximal nerve block of the area I then aspirated the third MPJ getting out of small amount of fluid and injected with quarter cc deck some some Kenalog and applied thick plantar padding to reduce stress on the joint and instructed on shoe gear modification. Reappoint to recheck in 2-3 weeks or earlier if needed  X-rays indicate that the patient does have apparent distention a soft tissue around this area but no stress fracture or arthritis

## 2017-03-25 DIAGNOSIS — Z1212 Encounter for screening for malignant neoplasm of rectum: Secondary | ICD-10-CM | POA: Diagnosis not present

## 2017-03-25 DIAGNOSIS — Z1211 Encounter for screening for malignant neoplasm of colon: Secondary | ICD-10-CM | POA: Diagnosis not present

## 2017-03-30 ENCOUNTER — Telehealth: Payer: Self-pay | Admitting: Internal Medicine

## 2017-03-30 DIAGNOSIS — M19011 Primary osteoarthritis, right shoulder: Secondary | ICD-10-CM

## 2017-03-30 DIAGNOSIS — M5412 Radiculopathy, cervical region: Secondary | ICD-10-CM

## 2017-03-30 NOTE — Telephone Encounter (Signed)
Please advise in PCP absence.  

## 2017-03-30 NOTE — Telephone Encounter (Signed)
OK to fill this/these prescription(s) with additional refills x0 Needs to have an OV every 3 months Thank you!  

## 2017-03-30 NOTE — Telephone Encounter (Signed)
Pt would like a refill of HYDROcodone-acetaminophen (NORCO) 10-325 MG tablet  Pt is going out of town on Thursday.

## 2017-03-31 MED ORDER — HYDROCODONE-ACETAMINOPHEN 10-325 MG PO TABS: 1.0000 | ORAL_TABLET | Freq: Four times a day (QID) | ORAL | 0 refills | Status: DC | PRN

## 2017-04-01 LAB — COLOGUARD: COLOGUARD: NEGATIVE

## 2017-04-01 NOTE — Telephone Encounter (Signed)
Patient called back. Patient informed rx ready and to make 3 month fu in July.

## 2017-04-15 ENCOUNTER — Encounter: Payer: Self-pay | Admitting: Internal Medicine

## 2017-04-15 ENCOUNTER — Ambulatory Visit: Payer: BLUE CROSS/BLUE SHIELD | Admitting: Podiatry

## 2017-04-21 ENCOUNTER — Telehealth: Payer: Self-pay | Admitting: Internal Medicine

## 2017-04-21 NOTE — Telephone Encounter (Signed)
Pt contacted and given negative result.

## 2017-04-21 NOTE — Telephone Encounter (Signed)
Patient calling in regard to cologuard screening test.  States has not received any results.  States she sent back to cologuard early May.

## 2017-04-21 NOTE — Telephone Encounter (Signed)
yes

## 2017-04-21 NOTE — Telephone Encounter (Signed)
Can I give the patient the negative result?

## 2017-04-29 NOTE — Telephone Encounter (Signed)
resulted

## 2017-05-06 ENCOUNTER — Telehealth: Payer: Self-pay | Admitting: Internal Medicine

## 2017-05-06 ENCOUNTER — Other Ambulatory Visit: Payer: Self-pay | Admitting: Internal Medicine

## 2017-05-06 DIAGNOSIS — M19011 Primary osteoarthritis, right shoulder: Secondary | ICD-10-CM

## 2017-05-06 DIAGNOSIS — M5412 Radiculopathy, cervical region: Secondary | ICD-10-CM

## 2017-05-06 MED ORDER — HYDROCODONE-ACETAMINOPHEN 10-325 MG PO TABS: 1.0000 | ORAL_TABLET | Freq: Four times a day (QID) | ORAL | 0 refills | Status: DC | PRN

## 2017-05-06 NOTE — Telephone Encounter (Signed)
Pt would like a refill of HYDROcodone-acetaminophen (NORCO) 10-325 MG tablet   Please call back

## 2017-05-29 ENCOUNTER — Other Ambulatory Visit: Payer: Self-pay | Admitting: Internal Medicine

## 2017-05-29 DIAGNOSIS — I10 Essential (primary) hypertension: Secondary | ICD-10-CM

## 2017-06-01 ENCOUNTER — Other Ambulatory Visit: Payer: Self-pay | Admitting: Internal Medicine

## 2017-06-01 ENCOUNTER — Telehealth: Payer: Self-pay | Admitting: Internal Medicine

## 2017-06-01 DIAGNOSIS — M5412 Radiculopathy, cervical region: Secondary | ICD-10-CM

## 2017-06-01 DIAGNOSIS — M19011 Primary osteoarthritis, right shoulder: Secondary | ICD-10-CM

## 2017-06-01 MED ORDER — HYDROCODONE-ACETAMINOPHEN 10-325 MG PO TABS: 1.0000 | ORAL_TABLET | Freq: Four times a day (QID) | ORAL | 0 refills | Status: DC | PRN

## 2017-06-01 NOTE — Telephone Encounter (Signed)
RX written 

## 2017-06-01 NOTE — Telephone Encounter (Signed)
Check [place] registry last [facility]/07/[facility] pls advise...Pam Scott

## 2017-06-01 NOTE — Telephone Encounter (Signed)
HYDROcodone-acetaminophen (NORCO) 10-325 MG tablet   Patient is requesting a refill on this medication. Please advise. Thank you.

## 2017-06-02 NOTE — Telephone Encounter (Signed)
Notified pt rx ready for pick-up.../lmb 

## 2017-06-25 ENCOUNTER — Telehealth: Payer: Self-pay | Admitting: Internal Medicine

## 2017-06-25 NOTE — Telephone Encounter (Signed)
Pt called for a refill of her HYDROcodone-acetaminophen (NORCO) 10-325 MG tablet  Last Med Fu 12/2016 Please advise

## 2017-06-25 NOTE — Telephone Encounter (Signed)
Check [place] registry last filled 06/02/2017. Last UDS 04/19/15...Pam Scott

## 2017-06-26 NOTE — Telephone Encounter (Signed)
Pt husband called regarding this, was notified of Ronnald Ramp being out today and will probably be ready Monday.

## 2017-06-29 ENCOUNTER — Other Ambulatory Visit: Payer: Self-pay | Admitting: Internal Medicine

## 2017-06-29 DIAGNOSIS — M797 Fibromyalgia: Secondary | ICD-10-CM

## 2017-06-29 DIAGNOSIS — M5412 Radiculopathy, cervical region: Secondary | ICD-10-CM

## 2017-06-29 DIAGNOSIS — M19011 Primary osteoarthritis, right shoulder: Secondary | ICD-10-CM

## 2017-06-29 MED ORDER — PREGABALIN 75 MG PO CAPS: 75.0000 mg | ORAL_CAPSULE | Freq: Three times a day (TID) | ORAL | 0 refills | Status: DC

## 2017-06-29 MED ORDER — HYDROCODONE-ACETAMINOPHEN 10-325 MG PO TABS: 1.0000 | ORAL_TABLET | Freq: Four times a day (QID) | ORAL | 0 refills | Status: DC | PRN

## 2017-06-29 NOTE — Telephone Encounter (Signed)
Notified pt rx ready for pick-up.../lmb 

## 2017-06-29 NOTE — Telephone Encounter (Signed)
RX written 

## 2017-07-02 ENCOUNTER — Encounter: Payer: Self-pay | Admitting: Podiatry

## 2017-07-02 ENCOUNTER — Ambulatory Visit (INDEPENDENT_AMBULATORY_CARE_PROVIDER_SITE_OTHER): Payer: BLUE CROSS/BLUE SHIELD | Admitting: Podiatry

## 2017-07-02 ENCOUNTER — Ambulatory Visit (INDEPENDENT_AMBULATORY_CARE_PROVIDER_SITE_OTHER): Payer: BLUE CROSS/BLUE SHIELD

## 2017-07-02 DIAGNOSIS — M779 Enthesopathy, unspecified: Principal | ICD-10-CM

## 2017-07-02 DIAGNOSIS — M7752 Other enthesopathy of left foot: Secondary | ICD-10-CM

## 2017-07-02 DIAGNOSIS — M778 Other enthesopathies, not elsewhere classified: Secondary | ICD-10-CM

## 2017-07-02 MED ORDER — DICLOFENAC SODIUM 75 MG PO TBEC: 75.0000 mg | DELAYED_RELEASE_TABLET | Freq: Two times a day (BID) | ORAL | 2 refills | Status: DC

## 2017-07-02 NOTE — Progress Notes (Signed)
Subjective:    Patient ID: Pam Scott, female   DOB: 71 y.o.   MRN: [id number]   HPI patient presents stating she was doing well but the last 6 weeks her foot has flared up again and it's very sore and burning was swelling and she has trouble walking on    ROS      Objective:  Physical Exam neurovascular status intact negative Homans sign was noted with patient's left foot showing exquisite inflammation fluid around the third MPJ with pain upon palpation     Assessment:   Inflammatory capsulitis with possibility for stress fracture left     Plan:    H&P condition reviewed and I do not want to do cortisone injection this close to the other one. At this point I did do a instructions on ice therapy and I dispensed air fracture walker to completely immobilize the plantar foot and take all pressure off this joint. Reappoint 3 weeks to reevaluate  X-rays indicate that there is no signs of stress fracture or advanced arthritis and appears to be inflammatory

## 2017-07-06 ENCOUNTER — Encounter: Payer: Self-pay | Admitting: Family

## 2017-07-06 ENCOUNTER — Ambulatory Visit (INDEPENDENT_AMBULATORY_CARE_PROVIDER_SITE_OTHER): Payer: BLUE CROSS/BLUE SHIELD | Admitting: Family

## 2017-07-06 VITALS — BP 142/86 | HR 56 | Temp 98.6°F | Resp 16 | Ht 64.0 in | Wt 147.0 lb

## 2017-07-06 DIAGNOSIS — J029 Acute pharyngitis, unspecified: Secondary | ICD-10-CM

## 2017-07-06 MED ORDER — PREDNISONE 10 MG PO TABS: 10.0000 mg | ORAL_TABLET | Freq: Two times a day (BID) | ORAL | 0 refills | Status: DC

## 2017-07-06 NOTE — Assessment & Plan Note (Signed)
Symptoms and exam consistent with viral pharyngitis and appears to be improving. Start prednisone for possible eustachian tube dysfunction. Continue over-the-counter medications as needed for symptom relief and supportive care. Follow-up if symptoms worsen or do not improve.

## 2017-07-06 NOTE — Patient Instructions (Signed)
Thank you for choosing [facility].  SUMMARY AND INSTRUCTIONS:  It appears you have a viral infection.  Start the prednisone for inflammation.   Over the counter medications as needed.  Follow up for worsening.   Medication:  Your prescription(s) have been submitted to your pharmacy or been printed and provided for you. Please take as directed and contact our office if you believe you are having problem(s) with the medication(s) or have any questions.  Follow up:  If your symptoms worsen or fail to improve, please contact our office for further instruction, or in case of emergency go directly to the emergency room at the closest medical facility.   General Recommendations:    Please drink plenty of fluids.  Get plenty of rest   Sleep in humidified air  Use saline nasal sprays  Netti pot   OTC Medications:  Decongestants - helps relieve congestion   Flonase (generic fluticasone) or Nasacort (generic triamcinolone) - please make sure to use the "cross-over" technique at a 45 degree angle towards the opposite eye as opposed to straight up the nasal passageway.   Sudafed (generic pseudoephedrine - Note this is the one that is available behind the pharmacy counter); Products with phenylephrine (-PE) may also be used but is often not as effective as pseudoephedrine.   If you have HIGH BLOOD PRESSURE - Coricidin HBP; AVOID any product that is -D as this contains pseudoephedrine which may increase your blood pressure.  Afrin (oxymetazoline) every 6-8 hours for up to 3 days.   Allergies - helps relieve runny nose, itchy eyes and sneezing   Claritin (generic loratidine), Allegra (fexofenidine), or Zyrtec (generic cyrterizine) for runny nose. These medications should not cause drowsiness.  Note - Benadryl (generic diphenhydramine) may be used however may cause drowsiness  Cough -   Delsym or Robitussin (generic dextromethorphan)  Expectorants - helps loosen mucus  to ease removal   Mucinex (generic guaifenesin) as directed on the package.  Headaches / General Aches   Tylenol (generic acetaminophen) - DO NOT EXCEED 3 grams (3,000 mg) in a 24 hour time period  Advil/Motrin (generic ibuprofen)   Sore Throat -   Salt water gargle   Chloraseptic (generic benzocaine) spray or lozenges / Sucrets (generic dyclonine)

## 2017-07-06 NOTE — Progress Notes (Signed)
Subjective:    Patient ID: Pam Scott, female    DOB: [birth year], 71 y.o.   MRN: [id number]  Chief Complaint  Patient presents with  . Sore Throat    states that she had a sore throat for about 6 days now, ears are bothering her, no noticed fever, but just not feeling well    HPI:  Pam Scott is a 71 y.o. female who  has a past medical history of Hyperlipidemia; Hypertension; and Osteoporosis. and presents today for an office visit.  This is a new problem. Associated symptom of sore throat has been going on for about 6 days and new onset ear discomfort within the past couple of days. Denies fever but describes she "is just not feeling well." Ears feel like they have been full with occasional ache located in the right ear. Modifying factors include Chloraseptic which soothes the throat and ibuprofen. Course of the symptoms have gradually improved since initial onset. No sick contacts.    Allergies  Allergen Reactions  . Amlodipine     edema  . Simvastatin     myalgias  . Erythromycin     Outpatient Medications Prior to Visit  Medication Sig Dispense Refill  . bimatoprost (LUMIGAN) 0.03 % ophthalmic drops 1 drop at bedtime.      . Cholecalciferol (VITAMIN D3) 2000 UNITS TABS Take by mouth.      . citalopram (CELEXA) 20 MG tablet TAKE 1 TABLET DAILY 90 tablet 3  . diclofenac (VOLTAREN) 75 MG EC tablet Take 1 tablet (75 mg total) by mouth 2 (two) times daily. 50 tablet 2  . estradiol (ESTRACE) 0.5 MG tablet TAKE 1 TABLET DAILY 90 tablet 2  . hydrochlorothiazide (MICROZIDE) 12.5 MG capsule Take 1 capsule (12.5 mg total) by mouth daily. 90 capsule 1  . HYDROcodone-acetaminophen (NORCO) 10-325 MG tablet Take 1 tablet by mouth every 6 (six) hours as needed. 75 tablet 0  . pregabalin (LYRICA) 75 MG capsule Take 1 capsule (75 mg total) by mouth 3 (three) times daily. 270 capsule 0   No facility-administered medications prior to visit.      Review of Systems    Constitutional: Positive for fatigue. Negative for chills and fever.  HENT: Positive for sore throat. Negative for congestion, rhinorrhea, sinus pain, sinus pressure and sneezing.   Respiratory: Positive for cough. Negative for chest tightness and shortness of breath.   Cardiovascular: Negative for chest pain, palpitations and leg swelling.  Neurological: Positive for headaches.      Objective:    BP (!) 142/86 (BP Location: Left Arm, Patient Position: Sitting, Cuff Size: Normal)   Pulse (!) 56   Temp 98.6 F (37 C) (Oral)   Resp 16   Ht 5\' 4"  (1.626 m)   Wt 147 lb (66.7 kg)   SpO2 96%   BMI 25.23 kg/m  Nursing note and vital signs reviewed.  Physical Exam  Constitutional: She is oriented to person, place, and time. She appears well-developed and well-nourished. No distress.  HENT:  Right Ear: Hearing, tympanic membrane, external ear and ear canal normal.  Left Ear: Hearing, tympanic membrane, external ear and ear canal normal.  Nose: Nose normal.  Mouth/Throat: Uvula is midline and mucous membranes are normal. Posterior oropharyngeal erythema present.  Cardiovascular: Normal rate, regular rhythm, normal heart sounds and intact distal pulses.   Pulmonary/Chest: Effort normal and breath sounds normal.  Neurological: She is alert and oriented to person, place, and time.  Skin: Skin is warm and dry.  Psychiatric: She has a normal mood and affect. Her behavior is normal. Judgment and thought content normal.       Assessment & Plan:   Problem List Items Addressed This Visit      Respiratory   Viral pharyngitis - Primary    Symptoms and exam consistent with viral pharyngitis and appears to be improving. Start prednisone for possible eustachian tube dysfunction. Continue over-the-counter medications as needed for symptom relief and supportive care. Follow-up if symptoms worsen or do not improve.          I am having Ms. Mcqueary start on predniSONE. I am also having her  maintain her bimatoprost, Vitamin D3, estradiol, citalopram, hydrochlorothiazide, HYDROcodone-acetaminophen, pregabalin, and diclofenac.   Meds ordered this encounter  Medications  . predniSONE (DELTASONE) 10 MG tablet    Sig: Take 1 tablet (10 mg total) by mouth 2 (two) times daily.    Dispense:  6 tablet    Refill:  0    Order Specific Question:   Supervising Provider    Answer:   Pricilla Holm A [[id number]]     Follow-up: Return if symptoms worsen or fail to improve.  Mauricio Po, FNP

## 2017-07-07 ENCOUNTER — Telehealth: Payer: Self-pay | Admitting: Internal Medicine

## 2017-07-07 ENCOUNTER — Ambulatory Visit: Payer: Self-pay | Admitting: Internal Medicine

## 2017-07-07 ENCOUNTER — Other Ambulatory Visit: Payer: Self-pay | Admitting: Internal Medicine

## 2017-07-07 DIAGNOSIS — M797 Fibromyalgia: Secondary | ICD-10-CM

## 2017-07-07 MED ORDER — PREGABALIN 75 MG PO CAPS: 75.0000 mg | ORAL_CAPSULE | Freq: Three times a day (TID) | ORAL | 1 refills | Status: DC

## 2017-07-07 NOTE — Telephone Encounter (Signed)
Called pt inform that [facility]. Pt states she did not pick-up bcz its cheaper w/mail order. [facility] spoke [facility]/rep she states med is control and can not be called in needing rx to be fax, or mail. Dr. Ronnald Ramp can you re-print rx to be fax...Pam Scott

## 2017-07-07 NOTE — Telephone Encounter (Signed)
Pt informed of [facility]

## 2017-07-07 NOTE — Telephone Encounter (Signed)
Please resend pregabalin (LYRICA) 75 MG capsule  [facility]

## 2017-07-07 NOTE — Telephone Encounter (Signed)
done

## 2017-07-12 ENCOUNTER — Other Ambulatory Visit: Payer: Self-pay | Admitting: Internal Medicine

## 2017-07-22 ENCOUNTER — Telehealth: Payer: Self-pay | Admitting: Internal Medicine

## 2017-07-22 NOTE — Telephone Encounter (Signed)
Check [place] registry last filled 06/29/2017...[name]

## 2017-07-22 NOTE — Telephone Encounter (Signed)
Patient is requesting refill on hydrocodone  °

## 2017-07-23 ENCOUNTER — Ambulatory Visit: Payer: BLUE CROSS/BLUE SHIELD | Admitting: Podiatry

## 2017-07-23 ENCOUNTER — Other Ambulatory Visit: Payer: Self-pay | Admitting: Internal Medicine

## 2017-07-24 ENCOUNTER — Other Ambulatory Visit: Payer: Self-pay | Admitting: Internal Medicine

## 2017-07-24 ENCOUNTER — Encounter: Payer: Self-pay | Admitting: Internal Medicine

## 2017-07-24 DIAGNOSIS — M5412 Radiculopathy, cervical region: Secondary | ICD-10-CM

## 2017-07-24 DIAGNOSIS — M19011 Primary osteoarthritis, right shoulder: Secondary | ICD-10-CM

## 2017-07-24 DIAGNOSIS — Z79891 Long term (current) use of opiate analgesic: Secondary | ICD-10-CM | POA: Diagnosis not present

## 2017-07-24 MED ORDER — HYDROCODONE-ACETAMINOPHEN 10-325 MG PO TABS: 1.0000 | ORAL_TABLET | Freq: Four times a day (QID) | ORAL | 0 refills | Status: DC | PRN

## 2017-07-24 NOTE — Telephone Encounter (Signed)
No ma'am refill can wait until MD return on Monday she is not due until 07/30/17.Marland Kitchen/LMB

## 2017-07-24 NOTE — Telephone Encounter (Signed)
Notified pt rx ready for pick-up.../lmb 

## 2017-07-24 NOTE — Telephone Encounter (Signed)
Patient is calling following up on the refill. Patient was informed Dr.Jones was not here today. She asked if another doctor would fill it. I informed patient most likely not since it is not due until the 13th.

## 2017-07-24 NOTE — Telephone Encounter (Signed)
RX written Get her in for an OV soon with UDS

## 2017-07-30 ENCOUNTER — Other Ambulatory Visit: Payer: Self-pay | Admitting: Internal Medicine

## 2017-08-04 ENCOUNTER — Telehealth: Payer: Self-pay

## 2017-08-04 DIAGNOSIS — H15111 Episcleritis periodica fugax, right eye: Secondary | ICD-10-CM | POA: Diagnosis not present

## 2017-08-04 NOTE — Telephone Encounter (Signed)
Noted../lmb 

## 2017-08-04 NOTE — Telephone Encounter (Signed)
Note from PCP states that pt prescription for Norco has been discontinued due to negative toxicology results.   Letter typed.

## 2017-08-31 ENCOUNTER — Ambulatory Visit (INDEPENDENT_AMBULATORY_CARE_PROVIDER_SITE_OTHER): Payer: BLUE CROSS/BLUE SHIELD | Admitting: Internal Medicine

## 2017-08-31 ENCOUNTER — Encounter: Payer: Self-pay | Admitting: Internal Medicine

## 2017-08-31 ENCOUNTER — Telehealth: Payer: Self-pay

## 2017-08-31 ENCOUNTER — Other Ambulatory Visit (INDEPENDENT_AMBULATORY_CARE_PROVIDER_SITE_OTHER): Payer: BLUE CROSS/BLUE SHIELD

## 2017-08-31 VITALS — BP 130/80 | HR 57 | Temp 98.0°F | Resp 16 | Ht 64.0 in | Wt 147.0 lb

## 2017-08-31 DIAGNOSIS — M19011 Primary osteoarthritis, right shoulder: Secondary | ICD-10-CM

## 2017-08-31 DIAGNOSIS — Z23 Encounter for immunization: Secondary | ICD-10-CM

## 2017-08-31 DIAGNOSIS — E785 Hyperlipidemia, unspecified: Secondary | ICD-10-CM | POA: Diagnosis not present

## 2017-08-31 DIAGNOSIS — R001 Bradycardia, unspecified: Secondary | ICD-10-CM | POA: Diagnosis not present

## 2017-08-31 DIAGNOSIS — M5412 Radiculopathy, cervical region: Secondary | ICD-10-CM

## 2017-08-31 DIAGNOSIS — I10 Essential (primary) hypertension: Secondary | ICD-10-CM

## 2017-08-31 LAB — COMPREHENSIVE METABOLIC PANEL
ALBUMIN: 4.4 g/dL (ref 3.5–5.2)
ALK PHOS: 61 U/L (ref 39–117)
ALT: 17 U/L (ref 0–35)
AST: 16 U/L (ref 0–37)
BUN: 19 mg/dL (ref 6–23)
CHLORIDE: 101 meq/L (ref 96–112)
CO2: 30 mEq/L (ref 19–32)
Calcium: 9.5 mg/dL (ref 8.4–10.5)
Creatinine, Ser: 1.04 mg/dL (ref 0.40–1.20)
GFR: 55.74 mL/min — AB (ref >60.00)
GLUCOSE: 84 mg/dL (ref 70–99)
POTASSIUM: 4 meq/L (ref 3.5–5.1)
Sodium: 139 mEq/L (ref 135–145)
TOTAL PROTEIN: 7.2 g/dL (ref 6.0–8.3)
Total Bilirubin: 0.5 mg/dL (ref 0.2–1.2)

## 2017-08-31 LAB — THYROID PANEL WITH TSH
Free Thyroxine Index: 1.8 (ref 1.4–3.8)
T3 Uptake: 27 % (ref 22–35)
T4, Total: 6.7 ug/dL (ref 5.1–11.9)
TSH: 2.27 m[IU]/L (ref 0.40–4.50)

## 2017-08-31 LAB — LIPID PANEL
CHOLESTEROL: 253 mg/dL — AB (ref 0–200)
HDL: 67.8 mg/dL (ref >39.00)
LDL CALC: 156 mg/dL — AB (ref 0–99)
NonHDL: 185.41
Total CHOL/HDL Ratio: 4
Triglycerides: 149 mg/dL (ref 0.0–149.0)
VLDL: 29.8 mg/dL (ref 0.0–40.0)

## 2017-08-31 MED ORDER — HYDROCODONE-ACETAMINOPHEN 10-325 MG PO TABS: 1.0000 | ORAL_TABLET | Freq: Three times a day (TID) | ORAL | 0 refills | Status: DC | PRN

## 2017-08-31 NOTE — Progress Notes (Signed)
Subjective:  Patient ID: Pam Scott, female    DOB: January 19, 1946  Age: 71 y.o. MRN: [id number]  CC: Hypertension; Hyperlipidemia; and Osteoarthritis   HPI Pam Scott presents for f/up - He recently underwent a urine drug screen which was inconsistent with her prescription of hydrocodone. She tells me that the cause for this is that she only takes a hydrocodone a couple of days a week. She complains of chronic, nonradiating neck pain and large joint pain. She wants to restart hydrocodone for the pain. She otherwise feels well and offers no other complaints today.  Outpatient Medications Prior to Visit  Medication Sig Dispense Refill  . bimatoprost (LUMIGAN) 0.03 % ophthalmic drops 1 drop at bedtime.      . Cholecalciferol (VITAMIN D3) 2000 UNITS TABS Take by mouth.      . citalopram (CELEXA) 20 MG tablet TAKE 1 TABLET DAILY 90 tablet 3  . diclofenac (VOLTAREN) 75 MG EC tablet Take 1 tablet (75 mg total) by mouth 2 (two) times daily. 50 tablet 2  . estradiol (ESTRACE) 0.5 MG tablet TAKE 1 TABLET DAILY 90 tablet 1  . hydrochlorothiazide (MICROZIDE) 12.5 MG capsule Take 1 capsule (12.5 mg total) by mouth daily. 90 capsule 1  . pregabalin (LYRICA) 75 MG capsule Take 1 capsule (75 mg total) by mouth 3 (three) times daily. 270 capsule 1  . predniSONE (DELTASONE) 10 MG tablet Take 1 tablet (10 mg total) by mouth 2 (two) times daily. 6 tablet 0   No facility-administered medications prior to visit.     ROS Review of Systems  Constitutional: Negative.  Negative for activity change, appetite change, diaphoresis, fatigue and unexpected weight change.  HENT: Negative.   Eyes: Negative.   Respiratory: Negative.  Negative for cough, chest tightness, shortness of breath and wheezing.   Cardiovascular: Negative for chest pain, palpitations and leg swelling.  Gastrointestinal: Negative for abdominal pain, blood in stool, constipation, diarrhea, nausea and vomiting.  Endocrine: Negative.     Genitourinary: Negative.  Negative for difficulty urinating.  Musculoskeletal: Positive for arthralgias and neck pain. Negative for back pain, gait problem and neck stiffness.  Skin: Negative.  Negative for color change and rash.  Allergic/Immunologic: Negative.   Neurological: Negative.  Negative for dizziness, weakness, numbness and headaches.  Hematological: Negative for adenopathy. Does not bruise/bleed easily.  Psychiatric/Behavioral: Negative.     Objective:  BP 130/80 (BP Location: Left Arm, Patient Position: Sitting, Cuff Size: Normal)   Pulse (!) 57   Temp 98 F (36.7 C) (Oral)   Resp 16   Ht 5\' 4"  (1.626 m)   Wt 147 lb (66.7 kg)   SpO2 97%   BMI 25.23 kg/m   BP Readings from Last 3 Encounters:  08/31/17 130/80  07/06/17 (!) 142/86  03/20/17 (!) 148/113    Wt Readings from Last 3 Encounters:  08/31/17 147 lb (66.7 kg)  07/06/17 147 lb (66.7 kg)  03/20/17 148 lb (67.1 kg)    Physical Exam  Constitutional: She is oriented to person, place, and time. No distress.  HENT:  Mouth/Throat: Oropharynx is clear and moist. No oropharyngeal exudate.  Eyes: Conjunctivae are normal. Right eye exhibits no discharge. Left eye exhibits no discharge. No scleral icterus.  Neck: Normal range of motion. Neck supple. No JVD present. No thyromegaly present.  Cardiovascular: Normal rate, regular rhythm and intact distal pulses.  Exam reveals no gallop.   No murmur heard. Pulmonary/Chest: Effort normal and breath sounds normal. No respiratory distress. She has no  wheezes. She has no rales. She exhibits no tenderness.  Abdominal: Soft. Bowel sounds are normal. She exhibits no distension and no mass. There is no tenderness. There is no rebound and no guarding.  Musculoskeletal: Normal range of motion. She exhibits no edema, tenderness or deformity.  Lymphadenopathy:    She has no cervical adenopathy.  Neurological: She is alert and oriented to person, place, and time.  Skin: Skin is  warm and dry. No rash noted. She is not diaphoretic. No erythema. No pallor.  Vitals reviewed.   Lab Results  Component Value Date   WBC 7.6 03/03/2017   HGB 13.9 03/03/2017   HCT 41.3 03/03/2017   PLT 186.0 03/03/2017   GLUCOSE 84 08/31/2017   CHOL 253 (H) 08/31/2017   TRIG 149.0 08/31/2017   HDL 67.80 08/31/2017   LDLDIRECT 159.9 06/23/2013   LDLCALC 156 (H) 08/31/2017   ALT 17 08/31/2017   AST 16 08/31/2017   NA 139 08/31/2017   K 4.0 08/31/2017   CL 101 08/31/2017   CREATININE 1.04 08/31/2017   BUN 19 08/31/2017   CO2 30 08/31/2017   TSH 2.27 08/31/2017    Dg Bone Density  Result Date: 10/27/2016 Date of study: 10/24/2016 Exam: DUAL X-RAY ABSORPTIOMETRY (DXA) FOR BONE MINERAL DENSITY (BMD) Instrument: [employer] Requesting Provider: PCP Indication: follow up for osteoporosis Comparison: 04/29/2012 Clinical data: Pt is a 71 y.o. female without previous history of fracture. On vitamin D, estradiol. Results:  Lumbar spine (L1-L4) Femoral neck (FN) T-score -1.9  RFN:-1.4 LFN:-1.1  Change in BMD from previous DXA test (%) +0.8%  +3.6%  (*) statistically significant Assessment: the BMD is low according to the [facility] classification for osteoporosis (see below). Fracture risk: moderate FRAX score: Not calculated as patient is on estrogen treatment. Comments: the technical quality of the study is good. Evaluation for secondary causes should be considered if clinically indicated. Recommend optimizing calcium (1200 mg/day) and vitamin D (800 IU/day) intake. Followup: Repeat BMD is appropriate after 2 years. WHO criteria for diagnosis of osteoporosis in postmenopausal women and in men 23 y/o or older: - normal: T-score -1.0 to + 1.0 - osteopenia/low bone density: T-score between -2.5 and -1.0 - osteoporosis: T-score below -2.5 - severe osteoporosis: T-score below -2.5 with history of fragility fracture Note: although not part of the WHO classification, the presence of a fragility  fracture, regardless of the T-score, should be considered diagnostic of osteoporosis, provided other causes for the fracture have been excluded. Treatment: The National Osteoporosis Foundation recommends that treatment be considered in postmenopausal women and men age 61 or older with: 1. Hip or vertebral (clinical or morphometric) fracture 2. T-score of - 2.5 or lower at the spine or hip 3. 10-year fracture probability by FRAX of at least 20% for a major osteoporotic fracture and 3% for a hip fracture Philemon Kingdom, MD [facility]    Assessment & Plan:   Pam Scott was seen today for hypertension, hyperlipidemia and osteoarthritis.  Diagnoses and all orders for this visit:  Need for influenza vaccination -     Flu vaccine HIGH DOSE PF (Fluzone High dose)  Essential hypertension, benign- her blood pressure is well controlled. Electrolytes and renal function are stable. -     Comprehensive metabolic panel; Future -     Thyroid Panel With TSH; Future  Hyperlipidemia with target LDL less than 130- she has a mildly elevated ASCVD risk score. I've asked her to consider taking an aspirin for CVD risk reduction. -  Lipid panel; Future -     Thyroid Panel With TSH; Future  Bradycardia- she is asymptomatic with respect to this. Her labs are negative for any secondary causes. Her heart rate is in the upper 50s. Will continue to monitor for complications. -     Thyroid Panel With TSH; Future  Cervical radiculitis -     Discontinue: HYDROcodone-acetaminophen (NORCO) 10-325 MG tablet; Take 1 tablet by mouth every 8 (eight) hours as needed. -     Discontinue: HYDROcodone-acetaminophen (NORCO) 10-325 MG tablet; Take 1 tablet by mouth every 8 (eight) hours as needed. -     HYDROcodone-acetaminophen (NORCO) 10-325 MG tablet; Take 1 tablet by mouth every 8 (eight) hours as needed.  Primary osteoarthritis of right shoulder -     Discontinue: HYDROcodone-acetaminophen (NORCO) 10-325 MG tablet;  Take 1 tablet by mouth every 8 (eight) hours as needed. -     Discontinue: HYDROcodone-acetaminophen (NORCO) 10-325 MG tablet; Take 1 tablet by mouth every 8 (eight) hours as needed. -     HYDROcodone-acetaminophen (NORCO) 10-325 MG tablet; Take 1 tablet by mouth every 8 (eight) hours as needed.   I have discontinued Ms. Parlato's predniSONE. I am also having her maintain her bimatoprost, Vitamin D3, citalopram, hydrochlorothiazide, diclofenac, pregabalin, estradiol, and HYDROcodone-acetaminophen.  Meds ordered this encounter  Medications  . DISCONTD: HYDROcodone-acetaminophen (NORCO) 10-325 MG tablet    Sig: Take 1 tablet by mouth every 8 (eight) hours as needed.    Dispense:  45 tablet    Refill:  0  . DISCONTD: HYDROcodone-acetaminophen (NORCO) 10-325 MG tablet    Sig: Take 1 tablet by mouth every 8 (eight) hours as needed.    Dispense:  45 tablet    Refill:  0  . HYDROcodone-acetaminophen (NORCO) 10-325 MG tablet    Sig: Take 1 tablet by mouth every 8 (eight) hours as needed.    Dispense:  45 tablet    Refill:  0     Follow-up: Return in about 3 months (around 12/01/2017).  Scarlette Calico, MD

## 2017-08-31 NOTE — Telephone Encounter (Signed)
-----   Message from [facility], [place] sent at 08/31/2017  9:33 AM EDT ----- Regarding: Shingles Wait List Can you add her to the wait list please.

## 2017-08-31 NOTE — Patient Instructions (Signed)

## 2017-08-31 NOTE — Telephone Encounter (Signed)
Patient has been added to shingrix waitlist, I will call her back when vaccines are available

## 2017-09-02 ENCOUNTER — Other Ambulatory Visit: Payer: Self-pay | Admitting: Internal Medicine

## 2017-09-02 ENCOUNTER — Encounter: Payer: Self-pay | Admitting: Internal Medicine

## 2017-09-02 DIAGNOSIS — Z1231 Encounter for screening mammogram for malignant neoplasm of breast: Secondary | ICD-10-CM

## 2017-09-02 DIAGNOSIS — H401132 Primary open-angle glaucoma, bilateral, moderate stage: Secondary | ICD-10-CM | POA: Diagnosis not present

## 2017-09-07 ENCOUNTER — Other Ambulatory Visit: Payer: Self-pay | Admitting: Internal Medicine

## 2017-09-07 DIAGNOSIS — E785 Hyperlipidemia, unspecified: Secondary | ICD-10-CM

## 2017-09-07 MED ORDER — PITAVASTATIN CALCIUM 1 MG PO TABS: 1.0000 | ORAL_TABLET | Freq: Every day | ORAL | 1 refills | Status: DC

## 2017-09-15 ENCOUNTER — Telehealth: Payer: Self-pay

## 2017-09-15 NOTE — Telephone Encounter (Signed)
Patient advised that she can come for her first shingrix vaccine at her earliest convenience---patient will call back tomorrow after she returns back into town to make nurse visit to get first shingrix--let tamara know when appt is made so that both vaccines can be labeled and placed in refrig

## 2017-09-21 NOTE — Telephone Encounter (Signed)
Appt scheduled for 11/13.

## 2017-09-21 NOTE — Telephone Encounter (Signed)
Both vaccines labeled and placed in [facility] refrig

## 2017-09-25 ENCOUNTER — Ambulatory Visit
Admission: RE | Admit: 2017-09-25 | Discharge: 2017-09-25 | Disposition: A | Discharge status: Home / Self Care | Payer: BLUE CROSS/BLUE SHIELD | Source: Ambulatory Visit | Attending: Internal Medicine | Admitting: Internal Medicine

## 2017-09-25 DIAGNOSIS — Z1231 Encounter for screening mammogram for malignant neoplasm of breast: Secondary | ICD-10-CM | POA: Diagnosis not present

## 2017-09-25 LAB — HM MAMMOGRAPHY

## 2017-09-29 ENCOUNTER — Ambulatory Visit (INDEPENDENT_AMBULATORY_CARE_PROVIDER_SITE_OTHER): Payer: BLUE CROSS/BLUE SHIELD

## 2017-09-29 DIAGNOSIS — Z23 Encounter for immunization: Secondary | ICD-10-CM

## 2017-10-19 ENCOUNTER — Telehealth: Payer: Self-pay | Admitting: Internal Medicine

## 2017-10-19 NOTE — Telephone Encounter (Signed)
Copied from [place]. Topic: Quick Communication - See Telephone Encounter >> Oct 19, 2017  2:00 PM [name], NT wrote: CRM for notification. See Telephone encounter for: Pt. Is calling to get a refill for her medication  HYDROcodone-acetaminophen (NORCO) 10-325 MG tablet. Pt would like to pick this medication up this week and would like a call back when medication is ready. 10/19/17.

## 2017-10-19 NOTE — Telephone Encounter (Signed)
Pt requesting refill on Norco 10-325. Last OV and refill on 08/31/17.

## 2017-10-20 ENCOUNTER — Other Ambulatory Visit: Payer: Self-pay | Admitting: Internal Medicine

## 2017-10-20 DIAGNOSIS — M5412 Radiculopathy, cervical region: Secondary | ICD-10-CM

## 2017-10-20 DIAGNOSIS — M19011 Primary osteoarthritis, right shoulder: Secondary | ICD-10-CM

## 2017-10-20 MED ORDER — HYDROCODONE-ACETAMINOPHEN 10-325 MG PO TABS: 1.0000 | ORAL_TABLET | Freq: Three times a day (TID) | ORAL | 0 refills | Status: DC | PRN

## 2017-10-20 NOTE — Telephone Encounter (Signed)
Notified pt MD has [facility].../lmb

## 2017-10-20 NOTE — Telephone Encounter (Signed)
RX sent

## 2017-11-11 ENCOUNTER — Other Ambulatory Visit: Payer: Self-pay | Admitting: Internal Medicine

## 2017-11-11 ENCOUNTER — Telehealth: Payer: Self-pay | Admitting: *Deleted

## 2017-11-11 DIAGNOSIS — M5412 Radiculopathy, cervical region: Secondary | ICD-10-CM

## 2017-11-11 DIAGNOSIS — M19011 Primary osteoarthritis, right shoulder: Secondary | ICD-10-CM

## 2017-11-11 MED ORDER — HYDROCODONE-ACETAMINOPHEN 10-325 MG PO TABS: 1.0000 | ORAL_TABLET | Freq: Three times a day (TID) | ORAL | 0 refills | Status: DC | PRN

## 2017-11-11 NOTE — Telephone Encounter (Signed)
Pam Scott 11/11/2017 01:26 PM  Summary: january refill     Pt returned your call. She is asking for refill for January. Please call back at [phone]   Check [place] registry last filled 10/20/17...[name]

## 2017-11-11 NOTE — Telephone Encounter (Signed)
Copied from [place]. Topic: Quick Communication - See Telephone Encounter >> Oct 19, 2017  2:00 PM [name], NT wrote: CRM for notification. See Telephone encounter for: Pt. Is calling to get a refill for her medication  HYDROcodone-acetaminophen (NORCO) 10-325 MG tablet. Pt would like to pick this medication up this week and would like a call back when medication is ready. 10/19/17. >> Nov 11, 2017 12:09 PM Darl Householder, RMA wrote: Pt is calling to check status of medication refill for Hydrocodone please return pt call

## 2017-11-12 NOTE — Telephone Encounter (Signed)
Notified pt MD sent renewal to [facility]...[name]

## 2017-12-11 ENCOUNTER — Other Ambulatory Visit: Payer: Self-pay | Admitting: Internal Medicine

## 2017-12-11 ENCOUNTER — Telehealth: Payer: Self-pay | Admitting: Internal Medicine

## 2017-12-11 DIAGNOSIS — M19011 Primary osteoarthritis, right shoulder: Secondary | ICD-10-CM

## 2017-12-11 DIAGNOSIS — M5412 Radiculopathy, cervical region: Secondary | ICD-10-CM

## 2017-12-11 MED ORDER — HYDROCODONE-ACETAMINOPHEN 10-325 MG PO TABS: 1.0000 | ORAL_TABLET | Freq: Three times a day (TID) | ORAL | 0 refills | Status: DC | PRN

## 2017-12-11 NOTE — Telephone Encounter (Signed)
Copied from [place] [id number]. Topic: Quick Communication - Rx Refill/Question >> Dec 11, 2017 10:22 AM Synthia Innocent wrote: Medication:HYDROcodone-acetaminophen [facility]) 10-325 MG tablet    Has the patient contacted their pharmacy? Yes, told to call provider   (Agent: If no, request that the patient contact the pharmacy for the refill.)   Preferred Pharm[facility] (with phone number [facility] street [facility] [place]: Please be advised that RX refills may take up to 3 business days. We ask that you follow-up with your pharmacy.

## 2017-12-12 ENCOUNTER — Other Ambulatory Visit: Payer: Self-pay | Admitting: Internal Medicine

## 2017-12-12 DIAGNOSIS — I10 Essential (primary) hypertension: Secondary | ICD-10-CM

## 2017-12-24 ENCOUNTER — Telehealth: Payer: Self-pay | Admitting: *Deleted

## 2017-12-24 NOTE — Telephone Encounter (Signed)
PA Sent (KeyFreddy Jaksch) - [id number] Livalo 1MG  OR TABS

## 2017-12-28 ENCOUNTER — Telehealth: Payer: Self-pay | Admitting: Internal Medicine

## 2017-12-28 NOTE — Telephone Encounter (Signed)
Copied from [place]. Topic: Quick Communication - Rx Refill/Question >> Dec 28, 2017  3:13 PM Tye Maryland wrote: Medication:  HYDROcodone-acetaminophen (NORCO) 10-325 MG tablet [[id number]  [facility] phone [facility]  Pt is asking also asking about her Rx Pitavastatin Calcium (LIVALO) 1 MG TABS [[id number]] pt's insurance is not paying for it and she is needing another one, contact pt to advise

## 2017-12-29 NOTE — Telephone Encounter (Signed)
Pt calling in to check on status.

## 2017-12-30 ENCOUNTER — Other Ambulatory Visit: Payer: Self-pay | Admitting: Internal Medicine

## 2017-12-30 DIAGNOSIS — E785 Hyperlipidemia, unspecified: Secondary | ICD-10-CM

## 2017-12-30 DIAGNOSIS — M19011 Primary osteoarthritis, right shoulder: Secondary | ICD-10-CM

## 2017-12-30 DIAGNOSIS — M5412 Radiculopathy, cervical region: Secondary | ICD-10-CM

## 2017-12-30 MED ORDER — ATORVASTATIN CALCIUM 20 MG PO TABS: 20.0000 mg | ORAL_TABLET | Freq: Every day | ORAL | 1 refills | Status: DC

## 2017-12-30 MED ORDER — HYDROCODONE-ACETAMINOPHEN 10-325 MG PO TABS: 1.0000 | ORAL_TABLET | Freq: Three times a day (TID) | ORAL | 0 refills | Status: DC | PRN

## 2017-12-30 NOTE — Telephone Encounter (Signed)
Pt rq rx for Norco and rq change of the Livalo.   Please advise

## 2018-01-19 DIAGNOSIS — H401132 Primary open-angle glaucoma, bilateral, moderate stage: Secondary | ICD-10-CM | POA: Diagnosis not present

## 2018-01-21 ENCOUNTER — Encounter: Payer: Self-pay | Admitting: Internal Medicine

## 2018-01-21 ENCOUNTER — Ambulatory Visit (INDEPENDENT_AMBULATORY_CARE_PROVIDER_SITE_OTHER)
Admission: RE | Admit: 2018-01-21 | Discharge: 2018-01-21 | Disposition: A | Discharge status: Home / Self Care | Payer: BLUE CROSS/BLUE SHIELD | Source: Ambulatory Visit | Attending: Internal Medicine | Admitting: Internal Medicine

## 2018-01-21 ENCOUNTER — Ambulatory Visit (INDEPENDENT_AMBULATORY_CARE_PROVIDER_SITE_OTHER): Payer: BLUE CROSS/BLUE SHIELD | Admitting: Internal Medicine

## 2018-01-21 VITALS — BP 138/88 | HR 58 | Temp 98.2°F | Resp 16 | Ht 64.0 in | Wt 150.0 lb

## 2018-01-21 DIAGNOSIS — M542 Cervicalgia: Secondary | ICD-10-CM

## 2018-01-21 DIAGNOSIS — S3992XA Unspecified injury of lower back, initial encounter: Secondary | ICD-10-CM | POA: Diagnosis not present

## 2018-01-21 DIAGNOSIS — M545 Low back pain, unspecified: Secondary | ICD-10-CM | POA: Insufficient documentation

## 2018-01-21 DIAGNOSIS — S199XXA Unspecified injury of neck, initial encounter: Secondary | ICD-10-CM

## 2018-01-21 NOTE — Patient Instructions (Signed)

## 2018-01-21 NOTE — Progress Notes (Signed)
Subjective:  Patient ID: Pam Scott, female    DOB: Oct 07, 1946  Age: 72 y.o. MRN: [id number]  CC: Back Pain and Neck Pain   HPI Pam Scott presents for persistent neck and low back pain 3 weeks after fall.  She describes standing on a step stool on a table in her house 3 weeks ago when she fell to the ground.  She hit her head and had a brief headache but did not lose consciousness.  She later went on to a vacation in [place] but complains of persistent, nonradiating neck and low back pain.  She describes the pain as a pressure sensation that is well controlled with her usual doses of hydrocodone and acetaminophen.  She denies paresthesias in her arms or legs.  Outpatient Medications Prior to Visit  Medication Sig Dispense Refill  . atorvastatin (LIPITOR) 20 MG tablet Take 1 tablet (20 mg total) by mouth daily. 90 tablet 1  . bimatoprost (LUMIGAN) 0.03 % ophthalmic drops 1 drop at bedtime.      . Cholecalciferol (VITAMIN D3) 2000 UNITS TABS Take by mouth.      . citalopram (CELEXA) 20 MG tablet TAKE 1 TABLET DAILY 90 tablet 3  . diclofenac (VOLTAREN) 75 MG EC tablet Take 1 tablet (75 mg total) by mouth 2 (two) times daily. 50 tablet 2  . estradiol (ESTRACE) 0.5 MG tablet TAKE 1 TABLET DAILY 90 tablet 1  . hydrochlorothiazide (MICROZIDE) 12.5 MG capsule TAKE 1 CAPSULE DAILY 90 capsule 1  . HYDROcodone-acetaminophen (NORCO) 10-325 MG tablet Take 1 tablet by mouth every 8 (eight) hours as needed. 45 tablet 0  . pregabalin (LYRICA) 75 MG capsule Take 1 capsule (75 mg total) by mouth 3 (three) times daily. 270 capsule 1   No facility-administered medications prior to visit.     ROS Review of Systems  Constitutional: Negative.  Negative for diaphoresis and fatigue.  HENT: Negative.   Eyes: Negative.  Negative for visual disturbance.  Respiratory: Negative for cough, chest tightness, shortness of breath and wheezing.   Cardiovascular: Negative for chest pain, palpitations and leg  swelling.  Gastrointestinal: Negative for abdominal pain, constipation, diarrhea, nausea and vomiting.  Endocrine: Negative.   Genitourinary: Negative.  Negative for difficulty urinating.  Musculoskeletal: Positive for back pain and neck pain. Negative for arthralgias and joint swelling.  Skin: Negative for color change and pallor.  Allergic/Immunologic: Negative.   Neurological: Negative.  Negative for dizziness, weakness, light-headedness and headaches.  Hematological: Negative for adenopathy. Does not bruise/bleed easily.  Psychiatric/Behavioral: Negative.     Objective:  BP 138/88 (BP Location: Left Arm, Patient Position: Sitting, Cuff Size: Large)   Pulse (!) 58   Temp 98.2 F (36.8 C) (Oral)   Resp 16   Ht 5\' 4"  (1.626 m)   Wt 150 lb (68 kg)   SpO2 97%   BMI 25.75 kg/m   BP Readings from Last 3 Encounters:  01/21/18 138/88  08/31/17 130/80  07/06/17 (!) 142/86    Wt Readings from Last 3 Encounters:  01/21/18 150 lb (68 kg)  08/31/17 147 lb (66.7 kg)  07/06/17 147 lb (66.7 kg)    Physical Exam  Constitutional: She is oriented to person, place, and time. No distress.  HENT:  Mouth/Throat: Oropharynx is clear and moist. No oropharyngeal exudate.  Eyes: Conjunctivae are normal. Left eye exhibits no discharge. No scleral icterus.  Neck: Normal range of motion. Neck supple. No JVD present. No thyromegaly present.  Cardiovascular: Normal rate and normal heart  sounds. Exam reveals no gallop.  No murmur heard. Pulmonary/Chest: Effort normal and breath sounds normal. No respiratory distress. She has no wheezes. She has no rales.  Abdominal: Soft. Bowel sounds are normal. She exhibits no distension and no mass. There is no tenderness. There is no guarding.  Musculoskeletal: Normal range of motion. She exhibits no edema, tenderness or deformity.       Cervical back: Normal. She exhibits normal range of motion, no tenderness, no swelling, no edema, no deformity and no pain.        Lumbar back: Normal. She exhibits normal range of motion, no tenderness, no bony tenderness, no swelling, no edema, no deformity and no pain.  Lymphadenopathy:    She has no cervical adenopathy.  Neurological: She is alert and oriented to person, place, and time. She has normal strength. She displays no atrophy and no tremor. No cranial nerve deficit or sensory deficit. She exhibits normal muscle tone. She displays a negative Romberg sign. She displays no seizure activity. Coordination and gait normal.  Skin: Skin is warm and dry. No rash noted. She is not diaphoretic. No erythema. No pallor.  Vitals reviewed.   Lab Results  Component Value Date   WBC 7.6 03/03/2017   HGB 13.9 03/03/2017   HCT 41.3 03/03/2017   PLT 186.0 03/03/2017   GLUCOSE 84 08/31/2017   CHOL 253 (H) 08/31/2017   TRIG 149.0 08/31/2017   HDL 67.80 08/31/2017   LDLDIRECT 159.9 06/23/2013   LDLCALC 156 (H) 08/31/2017   ALT 17 08/31/2017   AST 16 08/31/2017   NA 139 08/31/2017   K 4.0 08/31/2017   CL 101 08/31/2017   CREATININE 1.04 08/31/2017   BUN 19 08/31/2017   CO2 30 08/31/2017   TSH 2.27 08/31/2017    Mm Screening Breast Tomo Bilateral  Result Date: 09/25/2017 CLINICAL DATA:  Screening. EXAM: 2D DIGITAL SCREENING BILATERAL MAMMOGRAM WITH CAD AND ADJUNCT TOMO COMPARISON:  Previous exam(s). ACR Breast Density Category b: There are scattered areas of fibroglandular density. FINDINGS: There are no findings suspicious for malignancy. Images were processed with CAD. IMPRESSION: No mammographic evidence of malignancy. A result letter of this screening mammogram will be mailed directly to the patient. RECOMMENDATION: Screening mammogram in one year. (Code:SM-B-01Y) BI-RADS CATEGORY  1: Negative. Electronically Signed   By: Lajean Manes M.D.   On: 09/25/2017 15:59    Assessment & Plan:   [name] was seen today for back pain and neck pain.  Diagnoses and all orders for this visit:  Neck pain, acute- Plain  films are negative for fracture.  She has no radicular symptoms and is neurologically intact. Will continue her current meds for pain relief. -     DG Cervical Spine Complete; Future  Neck injury, initial encounter -     DG Cervical Spine Complete; Future  Low back pain at multiple sites- her plain films are negative for fracture.  She has no radicular symptoms and is neurologically intact. -     DG Lumbar Spine Complete; Future  Lower back injury, initial encounter -     DG Lumbar Spine Complete; Future   I am having Aaleyah Witherow maintain her bimatoprost, Vitamin D3, citalopram, diclofenac, pregabalin, estradiol, hydrochlorothiazide, atorvastatin, and HYDROcodone-acetaminophen.  No orders of the defined types were placed in this encounter.    Follow-up: Return in about 4 weeks (around 02/18/2018).  Scarlette Calico, MD

## 2018-01-22 ENCOUNTER — Encounter: Payer: Self-pay | Admitting: Internal Medicine

## 2018-01-22 ENCOUNTER — Telehealth: Payer: Self-pay | Admitting: Internal Medicine

## 2018-01-22 NOTE — Telephone Encounter (Signed)
Copied from [place] [id number]. Topic: Quick Communication - Rx Refill/Question >> Jan 22, 2018  1:28 PM Oliver Pila B wrote: Medication: HYDROcodone-acetaminophen (NORCO) 10-325 MG tablet [[id number]]   Pt called and wanted to talk about her Rx, pt states her pill count isnt enough, pt would liked to be called today if possible

## 2018-01-25 ENCOUNTER — Other Ambulatory Visit: Payer: Self-pay | Admitting: Internal Medicine

## 2018-01-25 DIAGNOSIS — M19011 Primary osteoarthritis, right shoulder: Secondary | ICD-10-CM

## 2018-01-25 DIAGNOSIS — M5412 Radiculopathy, cervical region: Secondary | ICD-10-CM

## 2018-01-25 MED ORDER — HYDROCODONE-ACETAMINOPHEN 10-325 MG PO TABS: 1.0000 | ORAL_TABLET | Freq: Three times a day (TID) | ORAL | 0 refills | Status: DC | PRN

## 2018-02-08 ENCOUNTER — Ambulatory Visit (INDEPENDENT_AMBULATORY_CARE_PROVIDER_SITE_OTHER): Payer: BLUE CROSS/BLUE SHIELD

## 2018-02-08 DIAGNOSIS — Z299 Encounter for prophylactic measures, unspecified: Secondary | ICD-10-CM

## 2018-02-10 ENCOUNTER — Ambulatory Visit (INDEPENDENT_AMBULATORY_CARE_PROVIDER_SITE_OTHER): Payer: BLUE CROSS/BLUE SHIELD | Admitting: Internal Medicine

## 2018-02-10 ENCOUNTER — Encounter: Payer: Self-pay | Admitting: Internal Medicine

## 2018-02-10 VITALS — BP 128/88 | HR 75 | Temp 98.2°F | Resp 16 | Wt 151.0 lb

## 2018-02-10 DIAGNOSIS — J029 Acute pharyngitis, unspecified: Secondary | ICD-10-CM

## 2018-02-10 LAB — POCT RAPID STREP A (OFFICE): RAPID STREP A SCREEN: NEGATIVE

## 2018-02-10 MED ORDER — METHYLPREDNISOLONE ACETATE 80 MG/ML IJ SUSP
80.0000 mg | Freq: Once | INTRAMUSCULAR | Status: AC
  Administered 2018-02-10: 80 mg via INTRAMUSCULAR

## 2018-02-10 NOTE — Progress Notes (Signed)
Subjective:    Patient ID: Pam Scott, female    DOB: 15-Oct-1946, 72 y.o.   MRN: [id number]  HPI She is here for an acute visit for cold symptoms.  Her symptoms started 4 days ago  She is experiencing sore throat, ear pain, aches, swollen glands, dry cough.  She denies fever, chills, congestion, sinus pain, sob, wheeze, headaches and GI symptoms.  She thought her symptoms were allergies initially, but they have worsened.  She got the shingles vaccine two days ago.  Her husband is sick as well and was just put on an antibiotic.    She has taken theraflu and advil.   Medications and allergies reviewed with patient and updated if appropriate.  Patient Active Problem List   Diagnosis Date Noted  . Neck pain, acute 01/21/2018  . Neck injury, initial encounter 01/21/2018  . Low back pain at multiple sites 01/21/2018  . Lower back injury, initial encounter 01/21/2018  . Estrogen deficiency 09/16/2016  . Primary osteoarthritis of right shoulder 04/17/2015  . Nonspecific abnormal electrocardiogram (ECG) (EKG) 06/28/2014  . Middle insomnia 06/28/2014  . Cervical radiculitis 07/06/2013  . Bradycardia 06/23/2013  . Fibromyalgia muscle pain 07/28/2012  . Routine general medical examination at a health care facility 04/19/2012  . Allergic rhinitis due to other allergen 03/20/2010  . Hyperlipidemia with target LDL less than 130 11/30/2009  . DEPRESSIVE DISORDER 11/30/2009  . Essential hypertension, benign 11/30/2009  . Osteoporosis 11/30/2009    Current Outpatient Medications on File Prior to Visit  Medication Sig Dispense Refill  . atorvastatin (LIPITOR) 20 MG tablet Take 1 tablet (20 mg total) by mouth daily. 90 tablet 1  . bimatoprost (LUMIGAN) 0.03 % ophthalmic drops 1 drop at bedtime.      . Cholecalciferol (VITAMIN D3) 2000 UNITS TABS Take by mouth.      . citalopram (CELEXA) 20 MG tablet TAKE 1 TABLET DAILY 90 tablet 3  . estradiol (ESTRACE) 0.5 MG tablet TAKE 1 TABLET  DAILY 90 tablet 1  . hydrochlorothiazide (MICROZIDE) 12.5 MG capsule TAKE 1 CAPSULE DAILY 90 capsule 1  . HYDROcodone-acetaminophen (NORCO) 10-325 MG tablet Take 1 tablet by mouth every 8 (eight) hours as needed. 60 tablet 0  . pregabalin (LYRICA) 75 MG capsule Take 1 capsule (75 mg total) by mouth 3 (three) times daily. 270 capsule 1  . diclofenac (VOLTAREN) 75 MG EC tablet Take 1 tablet (75 mg total) by mouth 2 (two) times daily. (Patient not taking: Reported on 02/10/2018) 50 tablet 2   No current facility-administered medications on file prior to visit.     Past Medical History:  Diagnosis Date  . Hyperlipidemia   . Hypertension   . Osteoporosis     No past surgical history on file.  Social History   Socioeconomic History  . Marital status: Married    Spouse name: Not on file  . Number of children: Not on file  . Years of education: Not on file  . Highest education level: Not on file  Occupational History  . Not on file  Social Needs  . Financial resource strain: Not on file  . Food insecurity:    Worry: Not on file    Inability: Not on file  . Transportation needs:    Medical: Not on file    Non-medical: Not on file  Tobacco Use  . Smoking status: Never Smoker  . Smokeless tobacco: Never Used  Substance and Sexual Activity  . Alcohol use: No  .  Drug use: No  . Sexual activity: Yes    Birth control/protection: Post-menopausal  Lifestyle  . Physical activity:    Days per week: Not on file    Minutes per session: Not on file  . Stress: Not on file  Relationships  . Social connections:    Talks on phone: Not on file    Gets together: Not on file    Attends religious service: Not on file    Active member of club or organization: Not on file    Attends meetings of clubs or organizations: Not on file    Relationship status: Not on file  Other Topics Concern  . Not on file  Social History Narrative  . Not on file    Family History  Problem Relation Age of  Onset  . Hypertension Father   . Cancer Neg Hx   . Hyperlipidemia Neg Hx   . Heart disease Neg Hx   . Diabetes Neg Hx   . Stroke Neg Hx     Review of Systems  Constitutional: Negative for chills and fever.  HENT: Positive for ear pain and sore throat. Negative for congestion, sinus pressure and sinus pain.   Respiratory: Positive for cough (dry). Negative for shortness of breath and wheezing.   Gastrointestinal: Negative for diarrhea and nausea.  Musculoskeletal: Positive for myalgias.  Neurological: Negative for light-headedness and headaches.       Objective:   Vitals:   02/10/18 1444  BP: 128/88  Pulse: 75  Resp: 16  Temp: 98.2 F (36.8 C)  SpO2: 97%   Filed Weights   02/10/18 1444  Weight: 151 lb (68.5 kg)   Body mass index is 25.92 kg/m.  Wt Readings from Last 3 Encounters:  02/10/18 151 lb (68.5 kg)  01/21/18 150 lb (68 kg)  08/31/17 147 lb (66.7 kg)     Physical Exam GENERAL APPEARANCE: Appears stated age, well appearing, NAD EYES: conjunctiva clear, no icterus HEENT: bilateral tympanic membranes and ear canals normal, oropharynx with mild erythema and swelling, no thyromegaly, trachea midline, tender cervical lymphadenopathy LUNGS: Clear to auscultation without wheeze or crackles, unlabored breathing, good air entry bilaterally CARDIOVASCULAR: Normal S1,S2 without murmurs, no edema SKIN: warm, dry        Assessment & Plan:   See Problem List for Assessment and Plan of chronic medical problems.

## 2018-02-10 NOTE — Assessment & Plan Note (Signed)
Rapid strep negative Likely viral Depo-medrol 80 mg IM x 1 for sore throat, allergies, swollen glands Continue advil, theraflu, increased rest and fluids  Call if no improvement

## 2018-02-10 NOTE — Patient Instructions (Signed)
Your rapid strep test is negative.   You received a steroid injection today.    Continue to take theraflu and advil.     Increase your rest and fluids.     Call if no improvement

## 2018-02-16 ENCOUNTER — Other Ambulatory Visit: Payer: Self-pay | Admitting: Internal Medicine

## 2018-02-16 DIAGNOSIS — M5412 Radiculopathy, cervical region: Secondary | ICD-10-CM

## 2018-02-16 DIAGNOSIS — M19011 Primary osteoarthritis, right shoulder: Secondary | ICD-10-CM

## 2018-02-16 MED ORDER — HYDROCODONE-ACETAMINOPHEN 10-325 MG PO TABS: 1.0000 | ORAL_TABLET | Freq: Three times a day (TID) | ORAL | 0 refills | Status: DC | PRN

## 2018-03-07 ENCOUNTER — Other Ambulatory Visit: Payer: Self-pay | Admitting: Internal Medicine

## 2018-03-15 ENCOUNTER — Other Ambulatory Visit: Payer: Self-pay | Admitting: Internal Medicine

## 2018-03-15 ENCOUNTER — Encounter: Payer: Self-pay | Admitting: Internal Medicine

## 2018-03-15 DIAGNOSIS — M19011 Primary osteoarthritis, right shoulder: Secondary | ICD-10-CM

## 2018-03-15 DIAGNOSIS — M5412 Radiculopathy, cervical region: Secondary | ICD-10-CM

## 2018-03-15 DIAGNOSIS — L237 Allergic contact dermatitis due to plants, except food: Secondary | ICD-10-CM

## 2018-03-15 MED ORDER — HYDROCODONE-ACETAMINOPHEN 10-325 MG PO TABS: 1.0000 | ORAL_TABLET | Freq: Three times a day (TID) | ORAL | 0 refills | Status: DC | PRN

## 2018-03-15 MED ORDER — CLOBETASOL PROPIONATE 0.05 % EX OINT: 1.0000 "application " | TOPICAL_OINTMENT | Freq: Two times a day (BID) | CUTANEOUS | 0 refills | Status: AC

## 2018-03-15 MED ORDER — METHYLPREDNISOLONE 4 MG PO TBPK: ORAL_TABLET | ORAL | 0 refills | Status: AC

## 2018-03-15 NOTE — Telephone Encounter (Signed)
Check [place] registry last filled 02/16/2018.Marland KitchenJohny Scott

## 2018-04-12 ENCOUNTER — Other Ambulatory Visit: Payer: Self-pay | Admitting: Internal Medicine

## 2018-04-12 DIAGNOSIS — M5412 Radiculopathy, cervical region: Secondary | ICD-10-CM

## 2018-04-12 DIAGNOSIS — M19011 Primary osteoarthritis, right shoulder: Secondary | ICD-10-CM

## 2018-04-13 ENCOUNTER — Other Ambulatory Visit: Payer: Self-pay | Admitting: Internal Medicine

## 2018-04-13 DIAGNOSIS — Z79891 Long term (current) use of opiate analgesic: Secondary | ICD-10-CM

## 2018-04-13 MED ORDER — NALOXONE HCL 4 MG/0.1ML NA LIQD: 1.0000 | Freq: Once | NASAL | 2 refills | Status: AC

## 2018-04-13 MED ORDER — HYDROCODONE-ACETAMINOPHEN 10-325 MG PO TABS: 1.0000 | ORAL_TABLET | Freq: Three times a day (TID) | ORAL | 0 refills | Status: DC | PRN

## 2018-05-09 ENCOUNTER — Other Ambulatory Visit: Payer: Self-pay | Admitting: Internal Medicine

## 2018-05-09 DIAGNOSIS — M5412 Radiculopathy, cervical region: Secondary | ICD-10-CM

## 2018-05-09 DIAGNOSIS — M19011 Primary osteoarthritis, right shoulder: Secondary | ICD-10-CM

## 2018-05-10 ENCOUNTER — Other Ambulatory Visit: Payer: Self-pay | Admitting: Internal Medicine

## 2018-05-10 DIAGNOSIS — Z79891 Long term (current) use of opiate analgesic: Secondary | ICD-10-CM

## 2018-05-10 DIAGNOSIS — M5412 Radiculopathy, cervical region: Secondary | ICD-10-CM

## 2018-05-10 DIAGNOSIS — M19011 Primary osteoarthritis, right shoulder: Secondary | ICD-10-CM

## 2018-05-10 MED ORDER — HYDROCODONE-ACETAMINOPHEN 10-325 MG PO TABS: 1.0000 | ORAL_TABLET | Freq: Three times a day (TID) | ORAL | 0 refills | Status: DC | PRN

## 2018-05-10 MED ORDER — NALOXONE HCL 4 MG/0.1ML NA LIQD: 1.0000 | Freq: Once | NASAL | 2 refills | Status: AC

## 2018-05-19 ENCOUNTER — Other Ambulatory Visit: Payer: Self-pay | Admitting: Internal Medicine

## 2018-05-19 DIAGNOSIS — I10 Essential (primary) hypertension: Secondary | ICD-10-CM

## 2018-05-31 ENCOUNTER — Encounter: Payer: Self-pay | Admitting: Internal Medicine

## 2018-06-07 ENCOUNTER — Other Ambulatory Visit (INDEPENDENT_AMBULATORY_CARE_PROVIDER_SITE_OTHER): Payer: BLUE CROSS/BLUE SHIELD

## 2018-06-07 ENCOUNTER — Encounter: Payer: Self-pay | Admitting: Internal Medicine

## 2018-06-07 ENCOUNTER — Ambulatory Visit: Payer: BLUE CROSS/BLUE SHIELD | Admitting: Internal Medicine

## 2018-06-07 VITALS — BP 144/90 | HR 60 | Temp 97.8°F | Resp 16 | Ht 64.0 in | Wt 146.0 lb

## 2018-06-07 DIAGNOSIS — E785 Hyperlipidemia, unspecified: Secondary | ICD-10-CM

## 2018-06-07 DIAGNOSIS — I10 Essential (primary) hypertension: Secondary | ICD-10-CM

## 2018-06-07 DIAGNOSIS — M5412 Radiculopathy, cervical region: Secondary | ICD-10-CM | POA: Diagnosis not present

## 2018-06-07 DIAGNOSIS — Z79899 Other long term (current) drug therapy: Secondary | ICD-10-CM | POA: Diagnosis not present

## 2018-06-07 DIAGNOSIS — M19011 Primary osteoarthritis, right shoulder: Secondary | ICD-10-CM

## 2018-06-07 LAB — BASIC METABOLIC PANEL
BUN: 14 mg/dL (ref 6–23)
CO2: 30 meq/L (ref 19–32)
CREATININE: 0.97 mg/dL (ref 0.40–1.20)
Calcium: 9.6 mg/dL (ref 8.4–10.5)
Chloride: 101 mEq/L (ref 96–112)
GFR: 60.27 mL/min (ref >60.00)
Glucose, Bld: 87 mg/dL (ref 70–99)
Potassium: 3.6 mEq/L (ref 3.5–5.1)
Sodium: 140 mEq/L (ref 135–145)

## 2018-06-07 LAB — LIPID PANEL
CHOL/HDL RATIO: 2
Cholesterol: 188 mg/dL (ref 0–200)
HDL: 78.4 mg/dL (ref >39.00)
LDL CALC: 88 mg/dL (ref 0–99)
NonHDL: 109.23
TRIGLYCERIDES: 106 mg/dL (ref 0.0–149.0)
VLDL: 21.2 mg/dL (ref 0.0–40.0)

## 2018-06-07 LAB — CK: Total CK: 192 U/L — ABNORMAL HIGH (ref 7–177)

## 2018-06-07 MED ORDER — OLMESARTAN MEDOXOMIL 20 MG PO TABS: 20.0000 mg | ORAL_TABLET | Freq: Every day | ORAL | 1 refills | Status: DC

## 2018-06-07 MED ORDER — HYDROCODONE-ACETAMINOPHEN 10-325 MG PO TABS: 1.0000 | ORAL_TABLET | Freq: Three times a day (TID) | ORAL | 0 refills | Status: DC | PRN

## 2018-06-07 NOTE — Progress Notes (Signed)
Subjective:  Patient ID: Pam Scott, female    DOB: [birth year]  Age: 72 y.o. MRN: [id number]  CC: Hypertension and Hyperlipidemia   HPI Pam Scott presents for f/up - She is concerned that her blood pressure is not adequately well controlled with the hydrochlorothiazide.  She denies any recent episodes of headache, blurred vision, chest pain, DOE, or shortness of breath.  She does complain of chronic fatigue.  Outpatient Medications Prior to Visit  Medication Sig Dispense Refill  . atorvastatin (LIPITOR) 20 MG tablet Take 1 tablet (20 mg total) by mouth daily. 90 tablet 1  . bimatoprost (LUMIGAN) 0.03 % ophthalmic drops 1 drop at bedtime.      . Cholecalciferol (VITAMIN D3) 2000 UNITS TABS Take by mouth.      . estradiol (ESTRACE) 0.5 MG tablet TAKE 1 TABLET DAILY 90 tablet 1  . hydrochlorothiazide (MICROZIDE) 12.5 MG capsule TAKE 1 CAPSULE DAILY 90 capsule 1  . pregabalin (LYRICA) 75 MG capsule Take 1 capsule (75 mg total) by mouth 3 (three) times daily. 270 capsule 1  . HYDROcodone-acetaminophen (NORCO) 10-325 MG tablet Take 1 tablet by mouth every 8 (eight) hours as needed. 75 tablet 0  . citalopram (CELEXA) 20 MG tablet TAKE 1 TABLET DAILY 90 tablet 3  . diclofenac (VOLTAREN) 75 MG EC tablet Take 1 tablet (75 mg total) by mouth 2 (two) times daily. (Patient not taking: Reported on 02/10/2018) 50 tablet 2   No facility-administered medications prior to visit.     ROS Review of Systems  Constitutional: Positive for fatigue. Negative for diaphoresis and unexpected weight change.  HENT: Negative.   Eyes: Negative for visual disturbance.  Respiratory: Negative for cough, chest tightness, shortness of breath and wheezing.   Cardiovascular: Negative for chest pain, palpitations and leg swelling.  Gastrointestinal: Negative for abdominal pain, constipation, diarrhea and vomiting.  Endocrine: Negative.   Genitourinary: Negative.  Negative for difficulty urinating.    Musculoskeletal: Positive for arthralgias, myalgias and neck pain. Negative for back pain.  Skin: Negative for color change, pallor and rash.  Neurological: Negative.  Negative for dizziness, weakness and light-headedness.  Hematological: Negative for adenopathy. Does not bruise/bleed easily.  Psychiatric/Behavioral: Negative.     Objective:  BP (!) 144/90 (BP Location: Left Arm, Patient Position: Sitting, Cuff Size: Normal)   Pulse 60   Temp 97.8 F (36.6 C) (Oral)   Resp 16   Ht 5\' 4"  (1.626 m)   Wt 146 lb (66.2 kg)   SpO2 98%   BMI 25.06 kg/m   BP Readings from Last 3 Encounters:  06/07/18 (!) 144/90  02/10/18 128/88  01/21/18 138/88    Wt Readings from Last 3 Encounters:  06/07/18 146 lb (66.2 kg)  02/10/18 151 lb (68.5 kg)  01/21/18 150 lb (68 kg)    Physical Exam  Constitutional: She is oriented to person, place, and time. No distress.  HENT:  Mouth/Throat: Oropharynx is clear and moist. No oropharyngeal exudate.  Eyes: Conjunctivae are normal. No scleral icterus.  Neck: Normal range of motion. Neck supple. No JVD present. No thyromegaly present.  Cardiovascular: Normal rate, regular rhythm and normal heart sounds. Exam reveals no friction rub.  No murmur heard. Pulmonary/Chest: Effort normal and breath sounds normal. She has no decreased breath sounds. She has no wheezes. She has no rhonchi. She has no rales.  Abdominal: Soft. Normal appearance and bowel sounds are normal. She exhibits no mass. There is no hepatosplenomegaly. There is no tenderness.  Musculoskeletal: Normal range  of motion. She exhibits no edema, tenderness or deformity.  Neurological: She is alert and oriented to person, place, and time.  Skin: Skin is warm and dry. She is not diaphoretic. No pallor.  Vitals reviewed.   Lab Results  Component Value Date   WBC 7.6 03/03/2017   HGB 13.9 03/03/2017   HCT 41.3 03/03/2017   PLT 186.0 03/03/2017   GLUCOSE 87 06/07/2018   CHOL 188 06/07/2018    TRIG 106.0 06/07/2018   HDL 78.40 06/07/2018   LDLDIRECT 159.9 06/23/2013   LDLCALC 88 06/07/2018   ALT 17 08/31/2017   AST 16 08/31/2017   NA 140 06/07/2018   K 3.6 06/07/2018   CL 101 06/07/2018   CREATININE 0.97 06/07/2018   BUN 14 06/07/2018   CO2 30 06/07/2018   TSH 2.27 08/31/2017    Dg Cervical Spine Complete  Result Date: 01/22/2018 CLINICAL DATA:  Fall.  Neck and low back pain. EXAM: CERVICAL SPINE - COMPLETE 4+ VIEW COMPARISON:  None FINDINGS: Normal alignment of the cervical spine. The vertebral body heights are well preserved. Disc space narrowing and ventral endplate spurring identified at C5-6 and C6-7. No fractures or dislocations. IMPRESSION: Negative cervical spine radiographs. Electronically Signed   By: Kerby Moors M.D.   On: 01/22/2018 08:38   Dg Lumbar Spine Complete  Result Date: 01/22/2018 CLINICAL DATA:  Fall last month with persistent low back pain, initial encounter EXAM: LUMBAR SPINE - COMPLETE 4+ VIEW COMPARISON:  None. FINDINGS: Five lumbar type vertebral bodies are well visualized. Vertebral body height is well maintained. No anterolisthesis is seen. No pars defects are noted. Mild degenerative changes of the facet joints are noted at L5-S1. Diffuse vascular calcifications are noted. IMPRESSION: Mild degenerative change without acute abnormality Electronically Signed   By: Inez Catalina M.D.   On: 01/22/2018 08:36    Assessment & Plan:   Pam Scott was seen today for hypertension and hyperlipidemia.  Diagnoses and all orders for this visit:  Essential hypertension, benign-her blood pressure is not adequately well controlled.  We will add an ARB to the thiazide diuretic.  Her electrolytes and renal function are normal. -     Basic metabolic panel; Future -     olmesartan (BENICAR) 20 MG tablet; Take 1 tablet (20 mg total) by mouth daily.  Hyperlipidemia with target LDL less than 130 - She has achieved her LDL goal.  Her CPK is mildly but chronically  elevated and is stable so I do not think this indicates that she has a myopathy and have therefore asked her to continue the statin for CV risk reduction. -     Lipid panel; Future -     CK; Future  Cervical radiculitis -     HYDROcodone-acetaminophen (NORCO) 10-325 MG tablet; Take 1 tablet by mouth every 8 (eight) hours as needed.  Primary osteoarthritis of right shoulder -     HYDROcodone-acetaminophen (NORCO) 10-325 MG tablet; Take 1 tablet by mouth every 8 (eight) hours as needed.  Encounter for medication management -     Pain Mgmt, Profile 8 w/Conf, U; Future   I have discontinued Pam Scott's citalopram and diclofenac. I am also having her start on olmesartan. Additionally, I am having her maintain her bimatoprost, Vitamin D3, pregabalin, atorvastatin, estradiol, hydrochlorothiazide, and HYDROcodone-acetaminophen.  Meds ordered this encounter  Medications  . olmesartan (BENICAR) 20 MG tablet    Sig: Take 1 tablet (20 mg total) by mouth daily.    Dispense:  90 tablet  Refill:  1  . HYDROcodone-acetaminophen (NORCO) 10-325 MG tablet    Sig: Take 1 tablet by mouth every 8 (eight) hours as needed.    Dispense:  75 tablet    Refill:  0     Follow-up: Return in about 4 months (around 10/08/2018).  Pam Calico, MD

## 2018-06-07 NOTE — Patient Instructions (Signed)

## 2018-06-10 ENCOUNTER — Other Ambulatory Visit: Payer: Self-pay | Admitting: Internal Medicine

## 2018-06-10 ENCOUNTER — Encounter: Payer: Self-pay | Admitting: Internal Medicine

## 2018-06-10 LAB — PAIN MGMT, PROFILE 8 W/CONF, U
6 ACETYLMORPHINE: NEGATIVE ng/mL (ref <10)
ALCOHOL METABOLITES: POSITIVE ng/mL — AB (ref <500)
Amphetamines: NEGATIVE ng/mL (ref <500)
BENZODIAZEPINES: NEGATIVE ng/mL (ref <100)
BUPRENORPHINE, URINE: NEGATIVE ng/mL (ref <5)
COCAINE METABOLITE: NEGATIVE ng/mL (ref <150)
CREATININE: 55.2 mg/dL
Ethyl Glucuronide (ETG): 27678 ng/mL — ABNORMAL HIGH (ref <500)
Ethyl Sulfate (ETS): 5317 ng/mL — ABNORMAL HIGH (ref <100)
MARIJUANA METABOLITE: NEGATIVE ng/mL (ref <20)
MDMA: NEGATIVE ng/mL (ref <500)
OXYCODONE: NEGATIVE ng/mL (ref <100)
Opiates: NEGATIVE ng/mL (ref <100)
Oxidant: NEGATIVE ug/mL (ref <200)
PH: 6.47 (ref 4.5–9.0)

## 2018-06-28 ENCOUNTER — Encounter: Payer: Self-pay | Admitting: Podiatry

## 2018-06-28 ENCOUNTER — Other Ambulatory Visit: Payer: Self-pay | Admitting: Podiatry

## 2018-06-28 ENCOUNTER — Ambulatory Visit (INDEPENDENT_AMBULATORY_CARE_PROVIDER_SITE_OTHER): Payer: BLUE CROSS/BLUE SHIELD | Admitting: Podiatry

## 2018-06-28 ENCOUNTER — Ambulatory Visit (INDEPENDENT_AMBULATORY_CARE_PROVIDER_SITE_OTHER): Payer: BLUE CROSS/BLUE SHIELD

## 2018-06-28 DIAGNOSIS — M79671 Pain in right foot: Secondary | ICD-10-CM | POA: Diagnosis not present

## 2018-06-28 DIAGNOSIS — M779 Enthesopathy, unspecified: Secondary | ICD-10-CM

## 2018-06-28 DIAGNOSIS — M79672 Pain in left foot: Secondary | ICD-10-CM | POA: Diagnosis not present

## 2018-06-28 DIAGNOSIS — M6740 Ganglion, unspecified site: Secondary | ICD-10-CM | POA: Diagnosis not present

## 2018-06-28 MED ORDER — TRIAMCINOLONE ACETONIDE 10 MG/ML IJ SUSP: 10.0000 mg | Freq: Once | INTRAMUSCULAR | Status: DC

## 2018-06-30 ENCOUNTER — Ambulatory Visit: Payer: BLUE CROSS/BLUE SHIELD | Admitting: Podiatry

## 2018-06-30 NOTE — Progress Notes (Signed)
Subjective:   Patient ID: Pam Scott, female   DOB: 72 y.o.   MRN: [id number]   HPI Patient presents stating that I have had a lot of problems with my right ankle and also that I have a small lesion on my right lateral foot that has popped up recently.  Patient states she is been very sore   ROS      Objective:  Physical Exam  Neurovascular status unchanged with patient found to have exquisite discomfort in the sinus tarsi with pain with inversion eversion of the foot and lesion measuring approximately 6 x 6 mm dorsal lateral aspect right foot that is freely movable within subcutaneous tissue with no redness or proximal edema erythema noted.  Patient has moderate depression of the arch and continues to get forefoot pain of a mild nature     Assessment:  Probability for sinus tarsitis inflammatory capsulitis right with ganglionic cyst right and overall foot pain bilateral     Plan:  H&P conditions reviewed and injected the sinus tarsi right 3 mg Kenalog 5 mg Xylocaine and discussed the ganglionic cyst and do not recommend surgery at the current time.  Patient will be seen back for [place] to reevaluate if it should grow in size become painful or if she should have issues with it and also will be seen back to the sinus tarsi.  X-ray indicates no indications of calcification or arthritis subtalar midtarsal joint

## 2018-07-08 DIAGNOSIS — H401132 Primary open-angle glaucoma, bilateral, moderate stage: Secondary | ICD-10-CM | POA: Diagnosis not present

## 2018-07-28 ENCOUNTER — Encounter: Payer: Self-pay | Admitting: Internal Medicine

## 2018-07-29 ENCOUNTER — Other Ambulatory Visit: Payer: Self-pay | Admitting: Internal Medicine

## 2018-07-29 DIAGNOSIS — M797 Fibromyalgia: Secondary | ICD-10-CM

## 2018-07-29 MED ORDER — PREGABALIN 75 MG PO CAPS: 75.0000 mg | ORAL_CAPSULE | Freq: Three times a day (TID) | ORAL | 1 refills | Status: DC

## 2018-07-29 NOTE — Telephone Encounter (Signed)
Copied from [place] [phone]. Topic: Quick Communication - Rx Refill/Question >> Jul 29, 2018  9:42 AM Cecelia Byars, NT wrote: Medication: pregabalin (LYRICA) 75 MG capsule  Has the patient contacted their pharmacy? yes  (Agent: If no, request that the patient contact the pharmacy for the refill. (Agent: If yes, when and what did the pharmacy advise?  Preferred Pharmacy (with phone number or street name [facility] - [name], [place] [phone] (Phone) [phone] (Fax    Agent: Please be advised that RX refills may take up to 3 business days. We ask that you follow-up with your pharmacy.

## 2018-07-29 NOTE — Telephone Encounter (Signed)
rx printed for pcp to sign.

## 2018-07-29 NOTE — Telephone Encounter (Signed)
LVM for patient to make November appt

## 2018-07-29 NOTE — Telephone Encounter (Signed)
Lyrica refill Last Refill:07/07/17 # 270/1 refill Last OV: 06/07/18 PCP: [facility]: [place], [place] [phone] (Phone) [phone] (Fax

## 2018-07-29 NOTE — Telephone Encounter (Signed)
Pt  Will no longer be seeing Dr. Ronnald Ramp. As they do not see eye to eye,  She would like her meds refilled while she looks for a new MD

## 2018-07-29 NOTE — Telephone Encounter (Signed)
Pt called to speak w/ someone b/c she states she not making an appt in [place] since she is leaving but still wants her meds refilled

## 2018-07-30 ENCOUNTER — Telehealth: Payer: Self-pay | Admitting: Internal Medicine

## 2018-07-30 NOTE — Telephone Encounter (Signed)
We sent the rx to local due to it being a 90 day with one refill.

## 2018-07-30 NOTE — Telephone Encounter (Signed)
Pt called, needs refill of Lyrica to [facility]. States her RX has expired.  (Dismissal letter sent 07/28/18)

## 2018-07-30 NOTE — Telephone Encounter (Signed)
Dismissed from [facility] at [facility] by Dr Scarlette Calico effective 07/28/18  Dismissal Letter sent out 1st class mail 07/30/18 LM

## 2018-08-02 ENCOUNTER — Telehealth: Payer: Self-pay | Admitting: Internal Medicine

## 2018-08-02 NOTE — Telephone Encounter (Signed)
Copied from [place] [phone]. Topic: General - Other >> Aug 02, 2018 11:12 AM Janace Aris A wrote:  Medication: pregabalin (LYRICA) 75 MG capsule  Has the patient contacted their pharmacy? yes   (Agent: If yes, when and what did the pharmacy advise?  RX was sent to the incorrect Pharmacy. Please resend to [facility].   Preferred Pharmacy (with phone number or street name [facility], [place]  [phone] (Phone) [phone] (Fax

## 2018-08-02 NOTE — Telephone Encounter (Signed)
refaxed #30 with 1 refill.

## 2018-08-02 NOTE — Telephone Encounter (Signed)
Pt. Reports Lyrica went to the wrong pharmacy.Please send to [facility].

## 2018-08-03 NOTE — Telephone Encounter (Signed)
refaxed again to mail order.

## 2018-08-09 ENCOUNTER — Other Ambulatory Visit: Payer: Self-pay | Admitting: Internal Medicine

## 2018-08-09 DIAGNOSIS — E785 Hyperlipidemia, unspecified: Secondary | ICD-10-CM

## 2018-08-16 ENCOUNTER — Telehealth: Payer: Self-pay | Admitting: Podiatry

## 2018-08-16 NOTE — Telephone Encounter (Signed)
Pt is in need of a Rheumatology referral. He and Dr. Paulla Dolly has talked about her going. Please call her back

## 2018-08-17 ENCOUNTER — Telehealth: Payer: Self-pay | Admitting: Podiatry

## 2018-08-17 DIAGNOSIS — M79671 Pain in right foot: Secondary | ICD-10-CM

## 2018-08-17 DIAGNOSIS — M79672 Pain in left foot: Principal | ICD-10-CM

## 2018-08-17 DIAGNOSIS — M779 Enthesopathy, unspecified: Secondary | ICD-10-CM

## 2018-08-17 DIAGNOSIS — M778 Other enthesopathies, not elsewhere classified: Secondary | ICD-10-CM

## 2018-08-17 NOTE — Telephone Encounter (Signed)
Patient called back to follow up on referral. She asked when would she get a call back and I did let her know that provider has not responded yet and as soon as he does the nurse would give her a call.

## 2018-08-18 NOTE — Telephone Encounter (Signed)
I informed pt Dr. Josephina Shih the referral to [facility]. Faxed required form, clinicals and demographics to [facility] with statement appt with 1st available physician.

## 2018-08-23 DIAGNOSIS — Z23 Encounter for immunization: Secondary | ICD-10-CM | POA: Diagnosis not present

## 2018-08-23 NOTE — Telephone Encounter (Signed)
I'm not sure what she wants referral for?

## 2018-08-24 NOTE — Addendum Note (Signed)
Addended by: Harriett Sine D on: 08/24/2018 10:55 AM   Modules accepted: Orders

## 2018-09-01 ENCOUNTER — Other Ambulatory Visit: Payer: Self-pay | Admitting: Internal Medicine

## 2018-09-01 DIAGNOSIS — Z1231 Encounter for screening mammogram for malignant neoplasm of breast: Secondary | ICD-10-CM

## 2018-09-06 ENCOUNTER — Other Ambulatory Visit: Payer: Self-pay | Admitting: Internal Medicine

## 2018-09-06 DIAGNOSIS — E785 Hyperlipidemia, unspecified: Secondary | ICD-10-CM

## 2018-09-09 ENCOUNTER — Other Ambulatory Visit: Payer: Self-pay | Admitting: Internal Medicine

## 2018-10-09 ENCOUNTER — Other Ambulatory Visit: Payer: Self-pay | Admitting: Internal Medicine

## 2018-10-09 DIAGNOSIS — E785 Hyperlipidemia, unspecified: Secondary | ICD-10-CM

## 2018-10-12 ENCOUNTER — Ambulatory Visit: Payer: BLUE CROSS/BLUE SHIELD

## 2018-10-12 ENCOUNTER — Ambulatory Visit (INDEPENDENT_AMBULATORY_CARE_PROVIDER_SITE_OTHER): Payer: BLUE CROSS/BLUE SHIELD | Admitting: Family Medicine

## 2018-10-12 ENCOUNTER — Encounter: Payer: Self-pay | Admitting: Family Medicine

## 2018-10-12 VITALS — BP 152/90 | HR 59 | Temp 98.3°F | Ht 64.0 in | Wt 155.0 lb

## 2018-10-12 DIAGNOSIS — M5412 Radiculopathy, cervical region: Secondary | ICD-10-CM

## 2018-10-12 DIAGNOSIS — I10 Essential (primary) hypertension: Secondary | ICD-10-CM | POA: Diagnosis not present

## 2018-10-12 DIAGNOSIS — E2839 Other primary ovarian failure: Secondary | ICD-10-CM

## 2018-10-12 DIAGNOSIS — M792 Neuralgia and neuritis, unspecified: Secondary | ICD-10-CM | POA: Diagnosis not present

## 2018-10-12 DIAGNOSIS — Z8659 Personal history of other mental and behavioral disorders: Secondary | ICD-10-CM | POA: Insufficient documentation

## 2018-10-12 DIAGNOSIS — M797 Fibromyalgia: Secondary | ICD-10-CM | POA: Diagnosis not present

## 2018-10-12 DIAGNOSIS — E785 Hyperlipidemia, unspecified: Secondary | ICD-10-CM

## 2018-10-12 DIAGNOSIS — E559 Vitamin D deficiency, unspecified: Secondary | ICD-10-CM

## 2018-10-12 DIAGNOSIS — Z8669 Personal history of other diseases of the nervous system and sense organs: Secondary | ICD-10-CM

## 2018-10-12 DIAGNOSIS — G8929 Other chronic pain: Secondary | ICD-10-CM

## 2018-10-12 DIAGNOSIS — M79671 Pain in right foot: Secondary | ICD-10-CM

## 2018-10-12 MED ORDER — ATORVASTATIN CALCIUM 20 MG PO TABS: 20.0000 mg | ORAL_TABLET | Freq: Every day | ORAL | 1 refills | Status: DC

## 2018-10-12 MED ORDER — OLMESARTAN MEDOXOMIL 20 MG PO TABS: 20.0000 mg | ORAL_TABLET | Freq: Every day | ORAL | 1 refills | Status: DC

## 2018-10-12 NOTE — Patient Instructions (Addendum)
Take a half-tablet of estrace daily for two weeks, and then a half tablet every other day for two weeks, and finally wean off.  Your goal blood pressure should be 140/00 or less on a regular basis, or medications should be started/ modified.    Normal blood pressure is less than 120/80.      Hypertension Hypertension, commonly called high blood pressure, is when the force of blood pumping through the arteries is too strong. The arteries are the blood vessels that carry blood from the heart throughout the body. Hypertension forces the heart to work harder to pump blood and may cause arteries to become narrow or stiff. Having untreated or uncontrolled hypertension can cause heart attacks, strokes, kidney disease, and other problems. A blood pressure reading consists of a higher number over a lower number. Ideally, your blood pressure should be below 120/80. The first ("top") number is called the systolic pressure. It is a measure of the pressure in your arteries as your heart beats. The second ("bottom") number is called the diastolic pressure. It is a measure of the pressure in your arteries as the heart relaxes. What are the causes? The cause of this condition is not known. What increases the risk? Some risk factors for high blood pressure are under your control. Others are not. Factors you can change  Smoking.  Having type 2 diabetes mellitus, high cholesterol, or both.  Not getting enough exercise or physical activity.  Being overweight.  Having too much fat, sugar, calories, or salt (sodium) in your diet.  Drinking too much alcohol. Factors that are difficult or impossible to change  Having chronic kidney disease.  Having a family history of high blood pressure.  Age. Risk increases with age.  Race. You may be at higher risk if you are African-American.  Gender. Men are at higher risk than women before age 16. After age 17, women are at higher risk than men.  Having  obstructive sleep apnea.  Stress. What are the signs or symptoms? Extremely high blood pressure (hypertensive crisis) may cause:  Headache.  Anxiety.  Shortness of breath.  Nosebleed.  Nausea and vomiting.  Severe chest pain.  Jerky movements you cannot control (seizures).  How is this diagnosed? This condition is diagnosed by measuring your blood pressure while you are seated, with your arm resting on a surface. The cuff of the blood pressure monitor will be placed directly against the skin of your upper arm at the level of your heart. It should be measured at least twice using the same arm. Certain conditions can cause a difference in blood pressure between your right and left arms. Certain factors can cause blood pressure readings to be lower or higher than normal (elevated) for a short period of time:  When your blood pressure is higher when you are in a health care provider's office than when you are at home, this is called white coat hypertension. Most people with this condition do not need medicines.  When your blood pressure is higher at home than when you are in a health care provider's office, this is called masked hypertension. Most people with this condition may need medicines to control blood pressure.  If you have a high blood pressure reading during one visit or you have normal blood pressure with other risk factors:  You may be asked to return on a different day to have your blood pressure checked again.  You may be asked to monitor your blood pressure at home  for 1 week or longer.  If you are diagnosed with hypertension, you may have other blood or imaging tests to help your health care provider understand your overall risk for other conditions. How is this treated? This condition is treated by making healthy lifestyle changes, such as eating healthy foods, exercising more, and reducing your alcohol intake. Your health care provider may prescribe medicine if  lifestyle changes are not enough to get your blood pressure under control, and if:  Your systolic blood pressure is above 130.  Your diastolic blood pressure is above 80.  Your personal target blood pressure may vary depending on your medical conditions, your age, and other factors. Follow these instructions at home: Eating and drinking  Eat a diet that is high in fiber and potassium, and low in sodium, added sugar, and fat. An example eating plan is called the DASH (Dietary Approaches to Stop Hypertension) diet. To eat this way: ? Eat plenty of fresh fruits and vegetables. Try to fill half of your plate at each meal with fruits and vegetables. ? Eat whole grains, such as whole wheat pasta, brown rice, or whole grain bread. Fill about one quarter of your plate with whole grains. ? Eat or drink low-fat dairy products, such as skim milk or low-fat yogurt. ? Avoid fatty cuts of meat, processed or cured meats, and poultry with skin. Fill about one quarter of your plate with lean proteins, such as fish, chicken without skin, beans, eggs, and tofu. ? Avoid premade and processed foods. These tend to be higher in sodium, added sugar, and fat.  Reduce your daily sodium intake. Most people with hypertension should eat less than 1,500 mg of sodium a day.  Limit alcohol intake to no more than 1 drink a day for nonpregnant women and 2 drinks a day for men. One drink equals 12 oz of beer, 5 oz of wine, or 1 oz of hard liquor. Lifestyle  Work with your health care provider to maintain a healthy body weight or to lose weight. Ask what an ideal weight is for you.  Get at least 30 minutes of exercise that causes your heart to beat faster (aerobic exercise) most days of the week. Activities may include walking, swimming, or biking.  Include exercise to strengthen your muscles (resistance exercise), such as pilates or lifting weights, as part of your weekly exercise routine. Try to do these types of exercises  for 30 minutes at least 3 days a week.  Do not use any products that contain nicotine or tobacco, such as cigarettes and e-cigarettes. If you need help quitting, ask your health care provider.  Monitor your blood pressure at home as told by your health care provider.  Keep all follow-up visits as told by your health care provider. This is important. Medicines  Take over-the-counter and prescription medicines only as told by your health care provider. Follow directions carefully. Blood pressure medicines must be taken as prescribed.  Do not skip doses of blood pressure medicine. Doing this puts you at risk for problems and can make the medicine less effective.  Ask your health care provider about side effects or reactions to medicines that you should watch for. Contact a health care provider if:  You think you are having a reaction to a medicine you are taking.  You have headaches that keep coming back (recurring).  You feel dizzy.  You have swelling in your ankles.  You have trouble with your vision. Get help right away if:  You develop a severe headache or confusion.  You have unusual weakness or numbness.  You feel faint.  You have severe pain in your chest or abdomen.  You vomit repeatedly.  You have trouble breathing. Summary  Hypertension is when the force of blood pumping through your arteries is too strong. If this condition is not controlled, it may put you at risk for serious complications.  Your personal target blood pressure may vary depending on your medical conditions, your age, and other factors. For most people, a normal blood pressure is less than 120/80.  Hypertension is treated with lifestyle changes, medicines, or a combination of both. Lifestyle changes include weight loss, eating a healthy, low-sodium diet, exercising more, and limiting alcohol. This information is not intended to replace advice given to you by your health care provider. Make sure you  discuss any questions you have with your health care provider. Document Released: 11/03/2005 Document Revised: 10/01/2016 Document Reviewed: 10/01/2016 Elsevier Interactive Patient Education  2018 [employer].    How to Take Your Blood Pressure   Blood pressure is a measurement of how strongly your blood is pressing against the walls of your arteries. Arteries are blood vessels that carry blood from your heart throughout your body. Your health care provider takes your blood pressure at each office visit. You can also take your own blood pressure at home with a blood pressure machine. You may need to take your own blood pressure:  To confirm a diagnosis of high blood pressure (hypertension).  To monitor your blood pressure over time.  To make sure your blood pressure medicine is working.  Supplies needed: To take your blood pressure, you will need a blood pressure machine. You can buy a blood pressure machine, or blood pressure monitor, at most drugstores or online. There are several types of home blood pressure monitors. When choosing one, consider the following:  Choose a monitor that has an arm cuff.  Choose a monitor that wraps snugly around your upper arm. You should be able to fit only one finger between your arm and the cuff.  Do not choose a monitor that measures your blood pressure from your wrist or finger.  Your health care provider can suggest a reliable monitor that will meet your needs. How to prepare To get the most accurate reading, avoid the following for 30 minutes before you check your blood pressure:  Drinking caffeine.  Drinking alcohol.  Eating.  Smoking.  Exercising.  Five minutes before you check your blood pressure:  Empty your bladder.  Sit quietly without talking in a dining chair, rather than in a soft couch or armchair.  How to take your blood pressure To check your blood pressure, follow the instructions in the manual that came with your  blood pressure monitor. If you have a digital blood pressure monitor, the instructions may be as follows: 1. Sit up straight. 2. Place your feet on the floor. Do not cross your ankles or legs. 3. Rest your left arm at the level of your heart on a table or desk or on the arm of a chair. 4. Pull up your shirt sleeve. 5. Wrap the blood pressure cuff around the upper part of your left arm, 1 inch (2.5 cm) above your elbow. It is best to wrap the cuff around bare skin. 6. Fit the cuff snugly around your arm. You should be able to place only one finger between the cuff and your arm. 7. Position the cord inside the  groove of your elbow. 8. Press the power button. 9. Sit quietly while the cuff inflates and deflates. 10. Read the digital reading on the monitor screen and write it down (record it). 11. Wait 2-3 minutes, then repeat the steps, starting at step 1.  What does my blood pressure reading mean? A blood pressure reading consists of a higher number over a lower number. Ideally, your blood pressure should be below 120/80. The first ("top") number is called the systolic pressure. It is a measure of the pressure in your arteries as your heart beats. The second ("bottom") number is called the diastolic pressure. It is a measure of the pressure in your arteries as the heart relaxes. Blood pressure is classified into four stages. The following are the stages for adults who do not have a short-term serious illness or a chronic condition. Systolic pressure and diastolic pressure are measured in a unit called mm Hg. Normal  Systolic pressure: below 814.  Diastolic pressure: below 80. Elevated  Systolic pressure: 481-856.  Diastolic pressure: below 80. Hypertension stage 1  Systolic pressure: 314-970.  Diastolic pressure: 26-37. Hypertension stage 2  Systolic pressure: 858 or above.  Diastolic pressure: 90 or above. You can have prehypertension or hypertension even if only the systolic or  only the diastolic number in your reading is higher than normal. Follow these instructions at home:  Check your blood pressure as often as recommended by your health care provider.  Take your monitor to the next appointment with your health care provider to make sure: ? That you are using it correctly. ? That it provides accurate readings.  Be sure you understand what your goal blood pressure numbers are.  Tell your health care provider if you are having any side effects from blood pressure medicine. Contact a health care provider if:  Your blood pressure is consistently high. Get help right away if:  Your systolic blood pressure is higher than 180.  Your diastolic blood pressure is higher than 110. This information is not intended to replace advice given to you by your health care provider. Make sure you discuss any questions you have with your health care provider. Document Released: 04/11/2016 Document Revised: 06/24/2016 Document Reviewed: 04/11/2016 Elsevier Interactive Patient Education  [employer].

## 2018-10-12 NOTE — Progress Notes (Signed)
New patient office visit note:  Impression and Recommendations:    1. Essential hypertension, benign-  poorly controlled.   2. Hyperlipidemia with target LDL less than 130   3. Fibromyalgia muscle pain   4. Neuropathic pain-due to fibromyalgia, sees Dr. Lenna Gilford of rheumatology   5. History of adjustment disorder   6. Vitamin D deficiency   7. Chronic foot pain, right-and ankle; sees Dr. Paulla Dolly of podiatry    8. Estrogen deficiency   9. Cervical radiculitis     1. New Patient Establishment - Extensive discussion held with patient regarding establishing as a new patient.  Discussed practices and policies here at the clinic, and answered all questions about care team and health management during appointment.  2. Specialty Care - Discussed need for patient to follow up with specialists as recommended, such as podiatry, rheumatology.  Extensive education and recommendations provided.  - When patient visits rheumatology, she knows to tell Dr. Trudie Reed about her new PCP, Dr. Raliegh Scarlet.  3. Estrogen Deficiency/Replacement - Advised patient to wean down off of estradiol/estrace.  Discussed need to wean down slowly off of hormones.  4. Hypertension - Elevated on intake today.  - Ambulatory blood pressure monitoring encouraged.  Check it every Monday morning, Wednesday afternoon, and Friday evening.  Reviewed and recommended that the patient purchase a blood pressure cuff and begin monitoring her BP at home.  - Reviewed that patient should be sitting calmly for 15-20 minutes before measuring her BP.  5. Pain Management - Educated patient that we do not do pain management here.  6. BMI Counseling Explained to patient what BMI refers to, and what it means medically.    Told patient to think about it as a "medical risk stratification measurement" and how increasing BMI is associated with increasing risk/ or worsening state of various diseases such as hypertension, hyperlipidemia, diabetes,  premature OA, depression etc.  American Heart Association guidelines for healthy diet, basically Mediterranean diet, and exercise guidelines of 30 minutes 5 days per week or more discussed in detail.  Health counseling performed.  All questions answered.  7. Lifestyle & Preventative Health Maintenance - Advised patient to continue working toward exercising to improve overall mental, physical, and emotional health.    - Reviewed the "spokes of the wheel" of mood and health management.  Stressed the importance of ongoing prudent habits, including regular exercise, appropriate sleep hygiene, healthful dietary habits, and prayer/meditation to relax.  - Encouraged patient to engage in daily physical activity, especially a formal exercise routine.  Recommended that the patient eventually strive for at least 150 minutes of moderate cardiovascular activity per week according to guidelines established by the [facility].   - Healthy dietary habits encouraged, including low-carb, and high amounts of lean protein in diet.   - Patient should also consume adequate amounts of water.   Education and routine counseling performed. Handouts provided.  8. Follow-Up - Patient is out of Benicar today.  Prescriptions refilled PRN. - Meet in 6 weeks to review BP log. - Obtain fasting lab work as baseline in near future. - Otherwise, continue to return for CPE and chronic follow-up as scheduled.   - Patient knows to call in if desired to address acute concerns.  Pt was in the office today for 32.5+ minutes, with over 50% time spent in face to face counseling of patients various medical conditions, treatment plans of those medical conditions including medicine management and lifestyle modification, strategies to improve health and well  being; and in coordination of care. SEE ABOVE TREATMENT PLAN FOR DETAILS   Meds ordered this encounter  Medications  . olmesartan (BENICAR) 20 MG tablet    Sig: Take 1 tablet (20 mg  total) by mouth daily.    Dispense:  90 tablet    Refill:  1  . atorvastatin (LIPITOR) 20 MG tablet    Sig: Take 1 tablet (20 mg total) by mouth daily.    Dispense:  90 tablet    Refill:  1    Medications Discontinued During This Encounter  Medication Reason  . olmesartan (BENICAR) 20 MG tablet Reorder  . atorvastatin (LIPITOR) 20 MG tablet Reorder      Gross side effects, risk and benefits, and alternatives of medications discussed with patient.  Patient is aware that all medications have potential side effects and we are unable to predict every side effect or drug-drug interaction that may occur.  Expresses verbal understanding and consents to current therapy plan and treatment regimen.  Return for (2) Follow-up 6 weeks-weaned Estrace,  recheck home blood pressures.  Please see AVS handed out to patient at the end of our visit for further patient instructions/ counseling done pertaining to today's office visit.    Note:  This document was prepared using Dragon voice recognition software and may include unintentional dictation errors.   This document serves as a record of services personally performed by [name], DO. It was created on her behalf by Toni Amend, a trained medical scribe. The creation of this record is based on the scribe's personal observations and the provider's statements to them.   I have reviewed the above medical documentation for accuracy and completeness and I concur.  [name], DO 10/12/2018 5:51 PM       ----------------------------------------------------------------------------------------------------   Subjective:    Chief complaint:   Chief Complaint  Patient presents with  . Annual Exam    HPI: Pam Scott is a pleasant 72 y.o. female who presents to [place] at [facility] today to review their medical history with me and establish care.   I asked the patient to review their chronic problem  list with me to ensure everything was updated and accurate.    All recent office visits with other providers, any medical records that patient brought in etc  - I reviewed today.     We asked pt to get [place] their medical records from [facility] providers/ specialists that they had seen within the past 3-5 years- if they are in private practice and/or do not work for [employer], [facility], [place], [place] or [employer] owned practice.  Told them to call their specialists to clarify this if they are not sure.    Has been seeing a doctor for about 8 years. Now she's older, age 40, and "I think I need a little more care."  Notes "he never interacted with me.  I wasn't feeling good about it."  She went in on 06/07/2018, has her normal blood test done; got a letter on MyChart saying she was being taken off of hydrocodone.  Patient says she was "cut off of any kind of patient care from [place]."  Social History From [place].  Lives in [place]. Has been here for 10 years in May. They love it here. Dr. Scarlette Calico with [facility] was her prior.  Married to husband Ed Ostrander. Husband retired at age 89, and realized "this is not me." "Back in the bread business," but with a  different company. 5 children together, 10 grandchildren.  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  Involved in the [employer], on [employer], lives next to [employer] in [place].  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  Goes to Bible Study every Thursday.   "I love the garden."  Grows flowers, has grown vegetables before.  Tobacco Use Never smoker.  EtOH Use Drinks wine, 5-7 drinks per week. Likes drinking white, sauvignon blanc, or red cabernet or pinot noir.  In a night, can have up to 4 glasses with friends. Regularly has 1-2 glasses per night.  Physical Activity Was exercising more regularly until pain developed. Started having foot pain on the bottom of her  right foot.  Went to see Dr. Paulla Dolly, the podiatrist.  Notes now it's her ankle that bothers her.  "When I go up, it's ok, but when I go down, my ankle bothers me."  Family History Mother died at age 26; patient was age 67. Says "they didn't know what was wrong with her." Hodgkin's Lymphoma.  Dad died at age 70 of congestive heart failure.  Past Medical History Patient takes Vitamin D daily. She wonders if she needs calcium supplementation.   - Hypertension Has been on blood pressure medication (HCTZ) for a while.  Former PCP recently noticed that her BP was higher, and was given a "supplement," started Benicar relatively recently.  Patient does not check her blood pressure at home.  States she is completely out of Benicar.  - Hyperlipidemia Started medicine 6 months ago.  Patient does not think her cholesterol has been re-checked.  - Fibromyalgia  Patient does not currently see anyone for her cervical neck pain.  Patient takes her Lyrica in the morning and at night.  Notes "I don't know if it's doing me any good."  Notes she has trouble taking it three times daily.  - Podiatry Dr. Paulla Dolly is her specialist.  - Dermatology Sees dermatology time to time, "not very often."  - Hormone Replacement Therapy Has been taking estrace for years.  - Rotator Cuff Surgery Had rotator cuff surgery.  - Aches & Pains - Rheumatology Has never had a rheumatologist in the past. Is going to see Dr. Trudie Reed on December 6th.  States she hurts bad, and when the weather changes, "it hurts to roll over in the bed."  States she doesn't know if Lyrica is doing her any good.   Wt Readings from Last 3 Encounters:  10/12/18 155 lb (70.3 kg)  06/07/18 146 lb (66.2 kg)  02/10/18 151 lb (68.5 kg)   BP Readings from Last 3 Encounters:  10/12/18 (!) 152/90  06/07/18 (!) 144/90  02/10/18 128/88   Pulse Readings from Last 3 Encounters:  10/12/18 (!) 59  06/07/18 60  02/10/18 75   BMI  Readings from Last 3 Encounters:  10/12/18 26.61 kg/m  06/07/18 25.06 kg/m  02/10/18 25.92 kg/m    Patient Care Team    Relationship Specialty Notifications Start End  [name], DO PCP - General Family Medicine  10/11/18   Marchia Bond, MD Consulting Physician Orthopedic Surgery  10/12/18   Levy Sjogren, MD Referring Physician Dermatology  10/12/18   Wallene Huh, [place] Physician Podiatry  10/12/18   Juluis Rainier  Optometry  10/12/18     Patient Active Problem List   Diagnosis Date Noted  . Hyperlipidemia with target LDL less than 130 11/30/2009  Priority: High  . Essential hypertension, benign 11/30/2009    Priority: High  . Neuropathic pain-due to fibromyalgia, sees Dr. Lenna Gilford of rheumatology 10/12/2018    Priority: Medium  . DEPRESSIVE DISORDER 11/30/2009    Priority: Medium  . Osteoporosis 11/30/2009    Priority: Medium  . Vitamin D deficiency 10/12/2018    Priority: Low  . Chronic foot pain, right-and ankle; sees Dr. Paulla Dolly of podiatry  10/12/2018    Priority: Low  . Long-term current use of opiate analgesic 04/13/2018    Priority: Low  . Fibromyalgia muscle pain 07/28/2012    Priority: Low  . History of adjustment disorder 10/12/2018  . Allergic contact dermatitis due to plants, except food 03/15/2018  . Low back pain at multiple sites 01/21/2018  . Estrogen deficiency 09/16/2016  . Primary osteoarthritis of right shoulder 04/17/2015  . Nonspecific abnormal electrocardiogram (ECG) (EKG) 06/28/2014  . Middle insomnia 06/28/2014  . Cervical radiculitis 07/06/2013  . Bradycardia 06/23/2013  . Routine general medical examination at a health care facility 04/19/2012  . Allergic rhinitis due to other allergen 03/20/2010       As reported by pt:  Past Medical History:  Diagnosis Date  . Hyperlipidemia   . Hypertension   . Osteoporosis      Past Surgical History:  Procedure Laterality Date  . ABDOMINAL HYSTERECTOMY      . CATARACT EXTRACTION    . CYSTOCELE REPAIR    . ROTATOR CUFF REPAIR       Family History  Problem Relation Age of Onset  . Hypertension Father   . Hodgkin's lymphoma Mother   . Cancer Neg Hx   . Hyperlipidemia Neg Hx   . Heart disease Neg Hx   . Diabetes Neg Hx   . Stroke Neg Hx      Social History   Substance and Sexual Activity  Drug Use No     Social History   Substance and Sexual Activity  Alcohol Use Yes  . Alcohol/week: 5.0 - 7.0 standard drinks  . Types: 5 - 7 Standard drinks or equivalent per week     Social History   Tobacco Use  Smoking Status Never Smoker  Smokeless Tobacco Never Used     Current Meds  Medication Sig  . atorvastatin (LIPITOR) 20 MG tablet Take 1 tablet (20 mg total) by mouth daily.  . bimatoprost (LUMIGAN) 0.03 % ophthalmic drops 1 drop at bedtime.    . Cholecalciferol (VITAMIN D3) 2000 UNITS TABS Take by mouth.    . estradiol (ESTRACE) 0.5 MG tablet TAKE 1 TABLET DAILY  . hydrochlorothiazide (MICROZIDE) 12.5 MG capsule TAKE 1 CAPSULE DAILY  . olmesartan (BENICAR) 20 MG tablet Take 1 tablet (20 mg total) by mouth daily.  . pregabalin (LYRICA) 75 MG capsule Take 1 capsule (75 mg total) by mouth 3 (three) times daily.  . [DISCONTINUED] atorvastatin (LIPITOR) 20 MG tablet TAKE 1 TABLET BY MOUTH EVERY DAY  . [DISCONTINUED] olmesartan (BENICAR) 20 MG tablet Take 1 tablet (20 mg total) by mouth daily.   Current Facility-Administered Medications for the 10/12/18 encounter (Office Visit) with [name], DO  Medication  . triamcinolone acetonide (KENALOG) 10 MG/ML injection 10 mg    Allergies: Amlodipine; Simvastatin; and Erythromycin   Review of Systems  Constitutional: Negative for chills, diaphoresis, fever, malaise/fatigue and weight loss.  HENT: Negative for congestion, sore throat and tinnitus.   Eyes: Negative for blurred vision, double vision and photophobia.  Respiratory: Negative for cough and wheezing.  Cardiovascular: Negative for chest pain and palpitations.  Gastrointestinal: Negative for blood in stool, diarrhea, nausea and vomiting.  Genitourinary: Negative for dysuria, frequency and urgency.  Musculoskeletal: Positive for joint pain (chronic) and myalgias (chronic).  Skin: Negative for itching and rash.  Neurological: Negative for dizziness, focal weakness, weakness and headaches.  Endo/Heme/Allergies: Negative for environmental allergies and polydipsia. Bruises/bleeds easily (chronic).  Psychiatric/Behavioral: Negative for depression and memory loss. The patient is not nervous/anxious and does not have insomnia.       Objective:   Blood pressure (!) 152/90, pulse (!) 59, temperature 98.3 F (36.8 C), height 5\' 4"  (1.626 m), weight 155 lb (70.3 kg), SpO2 99 %. Body mass index is 26.61 kg/m. General: Well Developed, well nourished, and in no acute distress.  Neuro: Alert and oriented x3, extra-ocular muscles intact, sensation grossly intact.  HEENT:[place]/AT, PERRLA, neck supple, No carotid bruits Skin: no gross rashes  Cardiac: Regular rate and rhythm Respiratory: Essentially clear to auscultation bilaterally. Not using accessory muscles, speaking in full sentences.  Abdominal: not grossly distended Musculoskeletal: Ambulates w/o diff, FROM * 4 ext.  Vasc: less 2 sec cap RF, warm and pink  Psych:  No HI/SI, judgement and insight good, Euthymic mood. Full Affect.    No results found for this or any previous visit (from the past 2160 hour(s)).

## 2018-10-22 DIAGNOSIS — M2550 Pain in unspecified joint: Secondary | ICD-10-CM | POA: Diagnosis not present

## 2018-10-22 DIAGNOSIS — M797 Fibromyalgia: Secondary | ICD-10-CM | POA: Diagnosis not present

## 2018-10-22 DIAGNOSIS — R5383 Other fatigue: Secondary | ICD-10-CM | POA: Diagnosis not present

## 2018-10-22 DIAGNOSIS — M150 Primary generalized (osteo)arthritis: Secondary | ICD-10-CM | POA: Diagnosis not present

## 2018-10-28 ENCOUNTER — Ambulatory Visit
Admission: RE | Admit: 2018-10-28 | Discharge: 2018-10-28 | Disposition: A | Discharge status: Home / Self Care | Payer: BLUE CROSS/BLUE SHIELD | Source: Ambulatory Visit | Attending: Internal Medicine | Admitting: Internal Medicine

## 2018-10-28 DIAGNOSIS — Z1231 Encounter for screening mammogram for malignant neoplasm of breast: Secondary | ICD-10-CM

## 2018-11-01 ENCOUNTER — Other Ambulatory Visit: Payer: Self-pay | Admitting: Internal Medicine

## 2018-11-01 DIAGNOSIS — R928 Other abnormal and inconclusive findings on diagnostic imaging of breast: Secondary | ICD-10-CM

## 2018-11-02 ENCOUNTER — Other Ambulatory Visit: Payer: Self-pay | Admitting: Family Medicine

## 2018-11-02 DIAGNOSIS — R928 Other abnormal and inconclusive findings on diagnostic imaging of breast: Secondary | ICD-10-CM

## 2018-11-03 ENCOUNTER — Ambulatory Visit
Admission: RE | Admit: 2018-11-03 | Discharge: 2018-11-03 | Disposition: A | Discharge status: Home / Self Care | Payer: BLUE CROSS/BLUE SHIELD | Source: Ambulatory Visit | Attending: Internal Medicine | Admitting: Internal Medicine

## 2018-11-03 ENCOUNTER — Ambulatory Visit: Payer: BLUE CROSS/BLUE SHIELD

## 2018-11-03 DIAGNOSIS — R928 Other abnormal and inconclusive findings on diagnostic imaging of breast: Secondary | ICD-10-CM

## 2018-11-15 ENCOUNTER — Other Ambulatory Visit: Payer: Self-pay | Admitting: Internal Medicine

## 2018-11-15 DIAGNOSIS — I10 Essential (primary) hypertension: Secondary | ICD-10-CM

## 2018-11-29 DIAGNOSIS — M2550 Pain in unspecified joint: Secondary | ICD-10-CM | POA: Diagnosis not present

## 2018-11-29 DIAGNOSIS — M797 Fibromyalgia: Secondary | ICD-10-CM | POA: Diagnosis not present

## 2018-11-29 DIAGNOSIS — M150 Primary generalized (osteo)arthritis: Secondary | ICD-10-CM | POA: Diagnosis not present

## 2018-11-30 ENCOUNTER — Ambulatory Visit: Payer: BLUE CROSS/BLUE SHIELD | Admitting: Family Medicine

## 2018-12-16 ENCOUNTER — Telehealth: Payer: Self-pay | Admitting: Family Medicine

## 2018-12-16 ENCOUNTER — Other Ambulatory Visit: Payer: Self-pay

## 2018-12-16 DIAGNOSIS — M797 Fibromyalgia: Secondary | ICD-10-CM

## 2018-12-16 NOTE — Telephone Encounter (Signed)
Patient called and requested refill on her Lyrica, states that she is out of medication.  Medication was last filled by a pervious provider.  LOV 10/12/2018 to est care.  Please review and advise. MPulliam, CMA/RT(R)

## 2018-12-16 NOTE — Telephone Encounter (Signed)
Patient is requesting a refill of her Lyrica (she states she is completely out), this will be her first refill of this med with [place]. If approved, she is requesting that it be sent to [facility] in [place].

## 2018-12-16 NOTE — Telephone Encounter (Signed)
Sent request to Dr. Raliegh Scarlet for review.  Patient is aware that Dr. Raliegh Scarlet is out of the office today and she does have enough to last through tomorrow. MPulliam, CMA/RT(R)

## 2018-12-16 NOTE — Telephone Encounter (Signed)
Needs to get from Dr Lenna Gilford- Rheum who txs her for the condition.

## 2018-12-20 NOTE — Telephone Encounter (Signed)
Called to notify patient, left message to call the office back. MPulliam, CMA/RT(R)

## 2018-12-21 ENCOUNTER — Ambulatory Visit (INDEPENDENT_AMBULATORY_CARE_PROVIDER_SITE_OTHER): Payer: BLUE CROSS/BLUE SHIELD | Admitting: Family Medicine

## 2018-12-21 ENCOUNTER — Encounter: Payer: Self-pay | Admitting: Family Medicine

## 2018-12-21 VITALS — BP 148/94 | HR 59 | Temp 98.2°F | Ht 64.0 in | Wt 158.4 lb

## 2018-12-21 DIAGNOSIS — M792 Neuralgia and neuritis, unspecified: Secondary | ICD-10-CM

## 2018-12-21 DIAGNOSIS — E785 Hyperlipidemia, unspecified: Secondary | ICD-10-CM

## 2018-12-21 DIAGNOSIS — E2839 Other primary ovarian failure: Secondary | ICD-10-CM

## 2018-12-21 DIAGNOSIS — M797 Fibromyalgia: Secondary | ICD-10-CM

## 2018-12-21 DIAGNOSIS — E559 Vitamin D deficiency, unspecified: Secondary | ICD-10-CM

## 2018-12-21 DIAGNOSIS — I10 Essential (primary) hypertension: Secondary | ICD-10-CM

## 2018-12-21 MED ORDER — PREGABALIN 75 MG PO CAPS: 75.0000 mg | ORAL_CAPSULE | Freq: Two times a day (BID) | ORAL | 1 refills | Status: DC

## 2018-12-21 NOTE — Patient Instructions (Signed)
Pam Scott, please obtain full set of fasting blood work on patient today.  Please also add a direct LDL.  Patient prefers to be called regarding the results and wishes to forego office visit to discuss.        Please see this website for more information about fibromyalgia:  [email or web address].[email or web address].it  What Is Fibromyalgia?  Fibromyalgia is a common disorder that involves widespread pain, tenderness, fatigue, and other symptoms. It's not a form of arthritis, but like arthritis, it can interfere with a person's ability to perform everyday activities. An estimated 5 million American adults have fibromyalgia. Between 80 and 90 percent of people with fibromyalgia are women, but men and children can also have this condition.  More About Fibromyalgia  For more information about fibromyalgia, visit the [facility] of Arthritis and Musculoskeletal and Skin Diseases Web site.   What's the [employer] regarding alternative treatments for Fibromyalgia?  What do we know about the effectiveness of complementary health approaches for fibromyalgia?  Although some studies of tai chi, yoga, mindfulness meditation, and biofeedback for fibromyalgia have had promising results, the evidence is too limited to allow definite conclusions to be reached about whether these approaches are helpful.  It's uncertain whether acupuncture is helpful for fibromyalgia pain.  Vitamin D supplements may reduce pain in people with fibromyalgia who are deficient in this vitamin.  Some preliminary research on transcranial magnetic stimulation (TMS) for fibromyalgia symptoms has had promising results.  What do we know about the safety of complementary health approaches for fibromyalgia?   The mind and body approaches discussed here generally have good safety records. However, some may need to be adapted to make them safe and comfortable for people with fibromyalgia.  Some of the natural  products that have been studied for fibromyalgia may have side effects or interact with medications.  Headaches sometimes occur as a side effect of TMS for fibromyalgia. TMS and other procedures involving magnets may not be safe for people who have metal implants or medical devices such as pacemakers in their bodies.   What the [place] for Fibromyalgia: Mind and Body Practices:  -Acupuncture -Biofeedback -Guided Imagery -Massage Therapy -Meditative Movement Practices (Tai Chi, Marcelino Duster, and Yoga) -Mindfulness Meditation -Other Mind and Body Practices  Natural Products:  -It has been suggested that deficiencies in vitamin D might worsen fibromyalgia symptoms. In one study of women with fibromyalgia who had low vitamin D levels, 20 weeks of vitamin D supplementation led to a reduction in pain.  -Researchers are investigating whether low magnesium levels contribute to fibromyalgia and if magnesium supplements might help to reduce symptoms.  You may try magnesium malate 250 mg orally each night before bedtime  -Other natural products that have been studied for fibromyalgia include dietary supplements such as soy, S-adenosyl-L-methionine (SAMe), and creatine, and topical products containing capsaicin (the substance that gives chili peppers their heat). --> There's not enough evidence to determine whether these products are helpful.  - And remember, "natural" doesn't necessarily mean "safe." Natural products can have side effects, and some may interact with medications. Even vitamins and minerals can be harmful if taken in excessive amounts.  Make sure you work with your doctor and monitor for adequate levels.  Some examples of natural anti-inflammatories are: Ginger Root or capsules, Omega 3's, Tart Cherry extract or dried and Tumeric (capsules or Dr Algis Greenhouse Armandina Gemma Milk)    Other Complementary Approaches to  Explore: -Balneotherapy -Homeopathy -Magnetic Therapies -Reiki   NCCIH-Funded Research  Recent NCCIH-sponsored studies have been investigating various aspects of complementary and integrative interventions for fibromyalgia, including:  -The effectiveness of traditional [place] medicine for treating fibromyalgia -How tai chi compares to aerobic exercise as an adjunctive treatment for fibromyalgia symptoms -Whether brain responses to placebos differ between people with fibromyalgia and healthy people.  More to Consider: Be aware that some complementary health approaches-particularly dietary supplements-may interact with conventional medical treatments. To learn more about using dietary supplements, see the [facility] fact sheet Using Dietary Supplements Wisely and the [facility] interactive module Understanding Drug-Supplement Interactions.  If you're considering a practitioner-provided complementary health approach such as acupuncture, check with your insurer to see if the services will be covered, and ask a trusted source (like your fibromyalgia health care provider or a nearby hospital or medical school) to recommend a practitioner.  Tell all your health care providers about any complementary or integrative health approaches you use. Give them a full picture of what you do to manage your health. This will help ensure coordinated and safe care.   For More Information:  1) Hydro: The [place] provides information on [facility] and complementary and integrative health approaches, including publications and searches of [employer] of scientific and medical literature. The Clearinghouse does not provide medical advice, treatment recommendations, or referrals to practitioners. Toll-free in the [place].:   [phone] TTY (for deaf and hard-of-hearing callers):   7782298739 Web site:   VoipGurus.fr E-mail:   info_0 .SouthExposed.es (link sends e-mail)  2) [place] (NIAMS):  The mission of NIAMS is to support research into the causes, treatment, and prevention of arthritis and musculoskeletal and skin diseases; the training of basic and clinical scientists to carry out this research; and the dissemination of information on research progress in these diseases. Toll-free in the [place].:   1-877-22-NIAMS Web site:   Www.niams.SouthExposed.es  3) PubMed A service of the [employer] of Medicine, PubMed contains publication information and (in most cases) brief summaries of articles from scientific and medical journals. For guidance from [facility] on using PubMed, see How To Find Information About Complementary Health Approaches on PubMed. Web site:   https://www.davis.org/  4) MedlinePlus To provide resources that help answer health questions, MedlinePlus (a service of the [employer] of Medicine) brings together authoritative information from the [employer] as well as other Government agencies and [employer]. Web site:   Www.medlineplus.gov  ----------------------------------------------------------------------------------------------------------------------  Certified Practitioners in Acupuncture and [place] certified   Lenetta Quaker, [place] Gender - Female [address]  [place] [email or web address] [phone] Gender - Female  [place] [phone]   Isabella Stalling [phone] Gender - Female [place] [address]   [place], [place] [phone] Gender - Female [address] [place] [address] [email or web address]    Scheryl Marten [phone] Gender - Female [place] [address] [email or web address] [phone] Gender - Female [place] [email or web address] [phone] Gender - Female [address]  Google Map Icon [place] [phone]   [place], [place] [phone] Gender - Female [address] Kristeen Mans  E  Google Map Icon [place] [phone]   Harlene Salts [phone] Gender - Female [place] [address]   Courtney Heys [phone] Gender - Female [place] [address]     Arthritis Arthritis is a term that is commonly used to refer to joint pain or joint disease. There are more than 100 types of arthritis. What are the causes? The most common cause of this condition is wear and tear of a joint. Other causes include:  Gout.  Inflammation of a joint.  An infection of a joint.  Sprains and other injuries near the joint.  A drug reaction or allergic reaction. In some cases, the cause may not be known. What are the signs or symptoms? The main symptom of this condition is pain in the joint with movement. Other symptoms include:  Redness, swelling, or stiffness at a joint.  Warmth coming from the joint.  Fever.  Overall feeling of illness. How is this diagnosed? This condition may be diagnosed with a physical exam and tests, including:  Blood tests.  Urine tests.  Imaging tests, such as MRI, X-rays, or a CT scan. Sometimes, fluid is removed from a joint for testing. How is this treated? Treatment for this condition may involve:  Treatment of the cause, if it is known.  Rest.  Raising (elevating) the joint.  Applying cold or hot packs to the joint.  Medicines to improve symptoms and reduce inflammation.  Injections  of a steroid such as cortisone into the joint to help reduce pain and inflammation. Depending on the cause of your arthritis, you may need to make lifestyle changes to reduce stress on your joint. These changes may include exercising more and losing weight. Follow these instructions at home: Medicines  Take over-the-counter and prescription medicines only as told by your health care provider.  Do not take aspirin to relieve pain if gout is suspected. Activity  Rest your joint if told by your health care provider. Rest is important when your disease is active and your joint feels painful, swollen, or stiff.  Avoid activities that make the pain worse. It is important to balance activity with rest.  Exercise your joint regularly with range-of-motion exercises as told by your health care provider. Try doing low-impact exercise, such as: ? Swimming. ? Water aerobics. ? Biking. ? Walking. Joint Care   If your joint is swollen, keep it elevated if told by your health care provider.  If your joint feels stiff in the morning, try taking a warm shower.  If directed, apply heat to the joint. If you have diabetes, do not apply heat without permission from your health care provider. ? Put a towel between the joint and the hot pack or heating pad. ? Leave the heat on the area for 20-30 minutes.  If directed, apply ice to the joint: ? Put ice in a plastic bag. ? Place a towel between your skin and the bag. ? Leave the ice on for 20 minutes, 2-3 times per day.  Keep all follow-up visits as told by your health care provider. This is important. Contact a health care provider if:  The pain gets worse.  You have a fever. Get help right away if:  You develop severe joint pain, swelling, or redness.  Many joints become painful and swollen.  You develop severe back pain.  You develop severe weakness in your  leg.  You cannot control your bladder or bowels. This information is not intended  to replace advice given to you by your health care provider. Make sure you discuss any questions you have with your health care provider. Document Released: 12/11/2004 Document Revised: 04/10/2016 Document Reviewed: 01/29/2015 Elsevier Interactive Patient Education  2019 [employer].

## 2018-12-21 NOTE — Progress Notes (Signed)
Impression and Recommendations:    1. Essential hypertension, benign-  poorly controlled.   2. Hyperlipidemia with target LDL less than 130   3. Fibromyalgia muscle pain   4. Neuropathic pain-due to fibromyalgia, sees Dr. Lenna Gilford of rheumatology   5. Estrogen deficiency   6. Vitamin D deficiency     - Need for lab work today. - Patient is not fasting today; direct LDL ordered.  1. Essential Hypertension, Benign - Elevated on Intake - Reviewed ambulatory blood pressure log with patient today. - Discussed that patient's average is within goal. - No changes made to her blood pressure medication. - Will continue to monitor.  - Prudent lifestyle changes such as dash diet and engaging in a regular exercise program discussed with patient.   - Ongoing ambulatory BP monitoring encouraged. Keep log and bring in next OV. - Check blood pressure Monday morning, Wednesday afternoon, Friday evening.  - Will continue to monitor.  2. Hyperlipidemia - Lab work obtained today. - Continue treatment plan as prescribed. - Will continue to monitor.  3. Estrogen Deficiency - Weaned off of Estrace - Patient has had no issues weaning off of Estrace. - No changes made today.  4. Vitamin D Deficiency - Continue supplementation as prescribed. - No changes made today.  5. Sleep Habits - Discussed appropriate sleep hygiene with patient today. - Try to restore sleep/wake cycles and sleep only at night. - Will continue to monitor.  6. Neuropathic Pain & Fibromyalgia Pain - Released from Rheumatology - Continue management on Meloxicam and Lyrica. - Encouraged adequate hydration and regular exercise to help alleviate pain.  - Reviewed the "spokes of the wheel" of mood and health management, including pain management.  Stressed the importance of ongoing prudent habits, including regular exercise, appropriate sleep hygiene, healthful dietary habits, and prayer/meditation to relax.  7.  Lifestyle & Preventative Health Maintenance - Advised patient to continue working toward exercising to improve overall mental, physical, and emotional health.    Health counseling performed.  All questions answered.  - Encouraged patient to engage in daily physical activity, especially a formal exercise routine.  Recommended that the patient eventually strive for at least 150 minutes of moderate cardiovascular activity per week according to guidelines established by the [facility].   - Healthy dietary habits encouraged, including low-carb, and high amounts of lean protein in diet.   - Patient should also consume adequate amounts of water.   Orders Placed This Encounter  Procedures  . CBC with Differential/Platelet  . Comprehensive metabolic panel  . Hemoglobin A1c  . Lipid panel  . T4, free  . TSH  . VITAMIN D 25 Hydroxy (Vit-D Deficiency, Fractures)  . Direct LDL    Meds ordered this encounter  Medications  . pregabalin (LYRICA) 75 MG capsule    Sig: Take 1 capsule (75 mg total) by mouth 2 (two) times daily.    Dispense:  180 capsule    Refill:  1    Medications Discontinued During This Encounter  Medication Reason  . estradiol (ESTRACE) 0.5 MG tablet Discontinued by provider  . pregabalin (LYRICA) 75 MG capsule Reorder     Gross side effects, risk and benefits, and alternatives of medications and treatment plan in general discussed with patient.  Patient is aware that all medications have potential side effects and we are unable to predict every side effect or drug-drug interaction that may occur.   Patient will call with any questions prior to using medication  if they have concerns.    Expresses verbal understanding and consents to current therapy and treatment regimen.  No barriers to understanding were identified.  Red flag symptoms and signs discussed in detail.  Patient expressed understanding regarding what to do in case of emergency\urgent symptoms  Please see AVS handed  out to patient at the end of our visit for further patient instructions/ counseling done pertaining to today's office visit.   Return for Follow-up 4 months for BP,chol,wt-, bring BP log.     Note:  This note was prepared with assistance of Dragon voice recognition software. Occasional wrong-word or sound-a-like substitutions may have occurred due to the inherent limitations of voice recognition software.   This document serves as a record of services personally performed by [name], DO. It was created on her behalf by Toni Amend, a trained medical scribe. The creation of this record is based on the scribe's personal observations and the provider's statements to them.   I have reviewed the above medical documentation for accuracy and completeness and I concur.  [name], DO 12/21/2018 4:59 PM       --------------------------------------------------------------------------------------------------------------------------------    Subjective:     HPI: Pam Scott is a 73 y.o. female who presents to [place] at [facility] today for issues as discussed below.  Went to [facility] for two weeks and forgot to check her blood pressure during that time.  Weaning off of Estrace Feels fine after weaning off of Estrace.  Rheumatology Follow-Up Ended Now Saw the Rheumatologist, Dr. Trudie Reed.  Was placed on meloxicam.  Notes "everything was fine in terms of the testing."    The Meloxicam has helped with patient's symptoms until recently.  Now she wakes up every night, on the hour.  States "lately I'm aching, hurting; I woke up and tossed and turned; finally I took tylenol to take the edge off."  Says she's not sleeping at night, so she often takes an hour nap in the daytime.  Manages her further pain on Lyrica.  Blood Pressure -  Her blood pressure has been controlled at home.  Pt has been checking it at home.   On average, blood pressure runs  in the 120's-130's.  Lowest was 100/61, highest was 150/90.  - Patient reports good compliance with blood pressure medications.  - Denies medication S-E   - Smoking Status noted   - She denies new onset of: chest pain, exercise intolerance, shortness of breath, dizziness, visual changes, headache, lower extremity swelling or claudication.   Last 3 blood pressure readings in our office are as follows: BP Readings from Last 3 Encounters:  12/21/18 (!) 157/96  10/12/18 (!) 152/90  06/07/18 (!) 144/90    Filed Weights   12/21/18 1043  Weight: 158 lb 6.4 oz (71.8 kg)     Wt Readings from Last 3 Encounters:  12/21/18 158 lb 6.4 oz (71.8 kg)  10/12/18 155 lb (70.3 kg)  06/07/18 146 lb (66.2 kg)   BP Readings from Last 3 Encounters:  12/21/18 (!) 157/96  10/12/18 (!) 152/90  06/07/18 (!) 144/90   Pulse Readings from Last 3 Encounters:  12/21/18 (!) 59  10/12/18 (!) 59  06/07/18 60   BMI Readings from Last 3 Encounters:  12/21/18 27.19 kg/m  10/12/18 26.61 kg/m  06/07/18 25.06 kg/m     Patient Care Team    Relationship Specialty Notifications Start End  [name], DO PCP - General Family Medicine  10/11/18  Marchia Bond, MD Consulting Physician Orthopedic Surgery  10/12/18   Levy Sjogren, MD Referring Physician Dermatology  10/12/18   Wallene Huh, [place] Physician Podiatry  10/12/18   Juluis Rainier  Optometry  10/12/18      Patient Active Problem List   Diagnosis Date Noted  . Hyperlipidemia with target LDL less than 130 11/30/2009    Priority: High  . Essential hypertension, benign 11/30/2009    Priority: High  . Neuropathic pain-due to fibromyalgia, sees Dr. Lenna Gilford of rheumatology 10/12/2018    Priority: Medium  . DEPRESSIVE DISORDER 11/30/2009    Priority: Medium  . Osteoporosis 11/30/2009    Priority: Medium  . Vitamin D deficiency 10/12/2018    Priority: Low  . Chronic foot pain, right-and ankle; sees Dr. Paulla Dolly of  podiatry  10/12/2018    Priority: Low  . Long-term current use of opiate analgesic 04/13/2018    Priority: Low  . Fibromyalgia muscle pain 07/28/2012    Priority: Low  . History of adjustment disorder 10/12/2018  . Allergic contact dermatitis due to plants, except food 03/15/2018  . Low back pain at multiple sites 01/21/2018  . Estrogen deficiency 09/16/2016  . Primary osteoarthritis of right shoulder 04/17/2015  . Nonspecific abnormal electrocardiogram (ECG) (EKG) 06/28/2014  . Middle insomnia 06/28/2014  . Cervical radiculitis 07/06/2013  . Bradycardia 06/23/2013  . Routine general medical examination at a health care facility 04/19/2012  . Allergic rhinitis due to other allergen 03/20/2010    Past Medical history, Surgical history, Family history, Social history, Allergies and Medications have been entered into the medical record, reviewed and changed as needed.    Current Meds  Medication Sig  . atorvastatin (LIPITOR) 20 MG tablet Take 1 tablet (20 mg total) by mouth daily.  . bimatoprost (LUMIGAN) 0.03 % ophthalmic drops 1 drop at bedtime.    . Cholecalciferol (VITAMIN D3) 2000 UNITS TABS Take by mouth.    . hydrochlorothiazide (MICROZIDE) 12.5 MG capsule TAKE 1 CAPSULE DAILY  . meloxicam (MOBIC) 15 MG tablet Take 1 tablet by mouth daily.  Marland Kitchen olmesartan (BENICAR) 20 MG tablet Take 1 tablet (20 mg total) by mouth daily.  . pregabalin (LYRICA) 75 MG capsule Take 1 capsule (75 mg total) by mouth 2 (two) times daily.  . [DISCONTINUED] pregabalin (LYRICA) 75 MG capsule Take 1 capsule (75 mg total) by mouth 3 (three) times daily.   Current Facility-Administered Medications for the 12/21/18 encounter (Office Visit) with [name], DO  Medication  . triamcinolone acetonide (KENALOG) 10 MG/ML injection 10 mg    Allergies:  Allergies  Allergen Reactions  . Amlodipine     edema  . Simvastatin     myalgias  . Erythromycin      Review of Systems:  A fourteen system  review of systems was performed and found to be positive as per HPI.   Objective:   Blood pressure (!) 157/96, pulse (!) 59, temperature 98.2 F (36.8 C), height 5\' 4"  (1.626 m), weight 158 lb 6.4 oz (71.8 kg), SpO2 98 %. Body mass index is 27.19 kg/m. General:  Well Developed, well nourished, appropriate for stated age.  Neuro:  Alert and oriented,  extra-ocular muscles intact  HEENT:  Normocephalic, atraumatic, neck supple, no carotid bruits appreciated  Skin:  no gross rash, warm, pink. Cardiac:  RRR, S1 S2 Respiratory:  ECTA B/L and A/P, Not using accessory muscles, speaking in full sentences- unlabored. Vascular:  Ext warm, no cyanosis apprec.; cap RF less  2 sec. Psych:  No HI/SI, judgement and insight good, Euthymic mood. Full Affect.

## 2018-12-22 LAB — COMPREHENSIVE METABOLIC PANEL
ALT: 35 IU/L — ABNORMAL HIGH (ref 0–32)
AST: 31 IU/L (ref 0–40)
Albumin/Globulin Ratio: 1.8 (ref 1.2–2.2)
Albumin: 4.6 g/dL (ref 3.8–4.8)
Alkaline Phosphatase: 67 IU/L (ref 39–117)
BILIRUBIN TOTAL: 0.5 mg/dL (ref 0.0–1.2)
BUN/Creatinine Ratio: 15 (ref 12–28)
BUN: 15 mg/dL (ref 8–27)
CO2: 23 mmol/L (ref 20–29)
Calcium: 10 mg/dL (ref 8.7–10.3)
Chloride: 102 mmol/L (ref 96–106)
Creatinine, Ser: 1 mg/dL (ref 0.57–1.00)
GFR calc Af Amer: 66 mL/min/{1.73_m2} (ref >59)
GFR calc non Af Amer: 57 mL/min/{1.73_m2} — ABNORMAL LOW (ref >59)
Globulin, Total: 2.5 g/dL (ref 1.5–4.5)
Glucose: 97 mg/dL (ref 65–99)
Potassium: 4 mmol/L (ref 3.5–5.2)
SODIUM: 143 mmol/L (ref 134–144)
Total Protein: 7.1 g/dL (ref 6.0–8.5)

## 2018-12-22 LAB — CBC WITH DIFFERENTIAL/PLATELET
Basophils Absolute: 0 10*3/uL (ref 0.0–0.2)
Basos: 1 %
EOS (ABSOLUTE): 0.2 10*3/uL (ref 0.0–0.4)
Eos: 2 %
Hematocrit: 38.5 % (ref 34.0–46.6)
Hemoglobin: 13.2 g/dL (ref 11.1–15.9)
IMMATURE GRANULOCYTES: 0 %
Immature Grans (Abs): 0 10*3/uL (ref 0.0–0.1)
Lymphocytes Absolute: 2.2 10*3/uL (ref 0.7–3.1)
Lymphs: 29 %
MCH: 30.4 pg (ref 26.6–33.0)
MCHC: 34.3 g/dL (ref 31.5–35.7)
MCV: 89 fL (ref 79–97)
Monocytes Absolute: 0.7 10*3/uL (ref 0.1–0.9)
Monocytes: 9 %
Neutrophils Absolute: 4.5 10*3/uL (ref 1.4–7.0)
Neutrophils: 59 %
Platelets: 197 10*3/uL (ref 150–450)
RBC: 4.34 x10E6/uL (ref 3.77–5.28)
RDW: 13.2 % (ref 11.7–15.4)
WBC: 7.6 10*3/uL (ref 3.4–10.8)

## 2018-12-22 LAB — TSH: TSH: 2.32 u[IU]/mL (ref 0.450–4.500)

## 2018-12-22 LAB — VITAMIN D 25 HYDROXY (VIT D DEFICIENCY, FRACTURES): VIT D 25 HYDROXY: 41.7 ng/mL (ref 30.0–100.0)

## 2018-12-22 LAB — LIPID PANEL
Chol/HDL Ratio: 2.5 ratio (ref 0.0–4.4)
Cholesterol, Total: 196 mg/dL (ref 100–199)
HDL: 79 mg/dL (ref >39)
LDL Calculated: 98 mg/dL (ref 0–99)
Triglycerides: 94 mg/dL (ref 0–149)
VLDL Cholesterol Cal: 19 mg/dL (ref 5–40)

## 2018-12-22 LAB — LDL CHOLESTEROL, DIRECT: LDL Direct: 104 mg/dL — ABNORMAL HIGH (ref 0–99)

## 2018-12-22 LAB — T4, FREE: FREE T4: 1.08 ng/dL (ref 0.82–1.77)

## 2018-12-22 LAB — HEMOGLOBIN A1C
Est. average glucose Bld gHb Est-mCnc: 123 mg/dL
Hgb A1c MFr Bld: 5.9 % — ABNORMAL HIGH (ref 4.8–5.6)

## 2018-12-29 ENCOUNTER — Encounter: Payer: Self-pay | Admitting: Family Medicine

## 2018-12-29 DIAGNOSIS — R7303 Prediabetes: Secondary | ICD-10-CM | POA: Insufficient documentation

## 2019-01-04 ENCOUNTER — Telehealth: Payer: Self-pay | Admitting: Family Medicine

## 2019-01-04 ENCOUNTER — Other Ambulatory Visit: Payer: Self-pay

## 2019-01-04 DIAGNOSIS — I10 Essential (primary) hypertension: Secondary | ICD-10-CM

## 2019-01-04 MED ORDER — HYDROCHLOROTHIAZIDE 12.5 MG PO CAPS: 12.5000 mg | ORAL_CAPSULE | Freq: Every day | ORAL | 1 refills | Status: DC

## 2019-01-04 NOTE — Telephone Encounter (Signed)
Patient is requesting a refill on HCTZ, medication was last filled by a pervious provider.  LOV 12/21/2018. Please review and advise. MPulliam, CMA/RT(R)

## 2019-01-04 NOTE — Telephone Encounter (Signed)
Will need OV prior to any further RF's - told to come back in 4 mo.  Please only give 4 mo worth as her BP has NOT been at goal!!  thnx

## 2019-01-04 NOTE — Telephone Encounter (Signed)
Patient is requesting a refill of her hydrochlorothiazide, this will be the first time Dr. Jenetta Downer has refilled this med. If approved please send refill to [facility]

## 2019-01-04 NOTE — Telephone Encounter (Signed)
Sent to Dr. Opalski to review and advise. MPulliam, CMA/RT(R)  

## 2019-01-05 ENCOUNTER — Other Ambulatory Visit: Payer: Self-pay | Admitting: Family Medicine

## 2019-01-05 DIAGNOSIS — M797 Fibromyalgia: Secondary | ICD-10-CM

## 2019-01-05 NOTE — Telephone Encounter (Signed)
Patient called states she wants all her Rx to be switched to [facility].   --Patient also states she is just about out of the Lyrica Rx & that the pharmacy ([facility] only gave her 60 pills instead of the 180.  --Forwarding message to medical assistant for review w/ pharmacy & submission of New Rx for :  pregabalin (LYRICA) 75 MG capsule [[id number]]   Order Details  Dose: 75 mg Route: Oral Frequency: 2 times daily  Dispense Quantity: 180 capsule Refills: 1 Fills remaining: --        Sig: Take 1 capsule (75 mg total) by mouth 2 (two) times daily.          To be sent to :   Preferred Pharmacies      [place] [place] - [address] [phone] (Phone) [phone] (Fax)   --Dion Body

## 2019-01-06 NOTE — Telephone Encounter (Signed)
Patient will need to contact [facility] to request that they contact [facility] to transfer the RX.  Called patient to notified, left message for her to call the office back. MPulliam, CMA/RT(R)

## 2019-01-07 ENCOUNTER — Other Ambulatory Visit: Payer: Self-pay

## 2019-01-07 DIAGNOSIS — I10 Essential (primary) hypertension: Secondary | ICD-10-CM

## 2019-01-07 MED ORDER — OLMESARTAN MEDOXOMIL 20 MG PO TABS: 20.0000 mg | ORAL_TABLET | Freq: Every day | ORAL | 1 refills | Status: DC

## 2019-01-07 NOTE — Telephone Encounter (Signed)
Called and spoke to [facility] and they verified that they would need a new RX for this medication due to it being a controls substance.  I also spoke to [facility] and verified that the patient was only given 30 days on 12/21/2018 due to insurance.  Canceled all remaining refills on the RX with [facility]. A new RX needs to be sent to [facility].  Please review and advise. MPulliam, CMA/RT(R)

## 2019-01-07 NOTE — Telephone Encounter (Signed)
Notified the patient that we are having to send a new RX.  Patient is also needing a refill on the meloxicam that was las prescribed by a pervious provider. Please review. MPulliam, CMA/RT(R)

## 2019-01-10 DIAGNOSIS — M25512 Pain in left shoulder: Secondary | ICD-10-CM | POA: Diagnosis not present

## 2019-01-10 DIAGNOSIS — L908 Other atrophic disorders of skin: Secondary | ICD-10-CM | POA: Diagnosis not present

## 2019-01-12 MED ORDER — PREGABALIN 75 MG PO CAPS: 75.0000 mg | ORAL_CAPSULE | Freq: Two times a day (BID) | ORAL | 0 refills | Status: DC

## 2019-01-12 MED ORDER — MELOXICAM 15 MG PO TABS: 15.0000 mg | ORAL_TABLET | Freq: Every day | ORAL | 0 refills | Status: DC

## 2019-03-20 ENCOUNTER — Other Ambulatory Visit: Payer: Self-pay | Admitting: Family Medicine

## 2019-03-20 DIAGNOSIS — E785 Hyperlipidemia, unspecified: Secondary | ICD-10-CM

## 2019-04-04 ENCOUNTER — Other Ambulatory Visit: Payer: Self-pay | Admitting: Family Medicine

## 2019-04-04 DIAGNOSIS — I10 Essential (primary) hypertension: Secondary | ICD-10-CM

## 2019-04-05 ENCOUNTER — Telehealth: Payer: Self-pay | Admitting: Family Medicine

## 2019-04-05 NOTE — Telephone Encounter (Signed)
Patient called says she was expecting refills on (4) different prescriptions but only received (2)  --Patient needs medical assistant to send refill order for :   1)--hydrochlorothiazide (MICROZIDE) 12.5 MG capsule [[id number]]   Order Details  Dose: 12.5 mg Route: Oral Frequency: Daily  Dispense Quantity: 60 capsule Refills: 1 Fills remaining: --        Sig: Take 1 capsule (12.5 mg total) by mouth daily.     ( PER PATIENT, [facility] SAYS NO REFILLS & they sent 90 per Patient in February )--forwarding to med asst to research w/ pharmacy.  ----Patient also request refill on :  pregabalin (LYRICA) 75 MG capsule [[id number]]   Order Details  Dose: 75 mg Route: Oral Frequency: 2 times daily  Dispense Quantity: 180 capsule Refills: 0 Fills remaining: --        Sig: Take 1 capsule (75 mg total) by mouth 2 (two) times daily.         --Please refill order to :  [place], [place] - [address] [phone] (Phone) [phone] (Fax)   ---glh

## 2019-04-06 ENCOUNTER — Other Ambulatory Visit: Payer: Self-pay

## 2019-04-06 DIAGNOSIS — M797 Fibromyalgia: Secondary | ICD-10-CM

## 2019-04-06 DIAGNOSIS — I10 Essential (primary) hypertension: Secondary | ICD-10-CM

## 2019-04-06 MED ORDER — HYDROCHLOROTHIAZIDE 12.5 MG PO CAPS: 12.5000 mg | ORAL_CAPSULE | Freq: Every day | ORAL | 1 refills | Status: DC

## 2019-04-06 NOTE — Telephone Encounter (Signed)
Patient gets her pain medications-Lyrica and meloxicam which she uses for treatment of her fibromyalgia, these come from her rheumatologist, I believe Dr. Lenna Gilford

## 2019-04-06 NOTE — Telephone Encounter (Signed)
Sent refill for lyric sent to Dr. Raliegh Scarlet to review.  Refill on HCTZ sent to the pharmacy. MPulliam, CMA/RT(R)

## 2019-04-06 NOTE — Telephone Encounter (Signed)
Patient is requesting a refill on lyrica.  Medication last filled 01/12/2019 for 90 days.  LOV 12/21/18  Follow up on 05/11/2019.  Please review and advise. MPulliam, CMA/RT(R)

## 2019-04-07 NOTE — Telephone Encounter (Signed)
Called patient - unable to reach no VM. MPulliam, CMA/RT(R)

## 2019-04-08 DIAGNOSIS — M25531 Pain in right wrist: Secondary | ICD-10-CM | POA: Diagnosis not present

## 2019-04-12 NOTE — Telephone Encounter (Signed)
Left message for the patient to call the office. MPulliam, CMA/RT(R)

## 2019-04-13 ENCOUNTER — Telehealth: Payer: Self-pay | Admitting: Family Medicine

## 2019-04-13 NOTE — Telephone Encounter (Signed)
Patient notified that Lyrica needs to be refilled by Dr. Lenna Gilford per Dr. Raliegh Scarlet patient will contact their office for refills. MPulliam, CMA/RT(R)

## 2019-04-13 NOTE — Telephone Encounter (Signed)
Notified patient, she will contact Dr. Lenna Gilford. MPulliam, CMA/RT(R)

## 2019-04-13 NOTE — Telephone Encounter (Signed)
Pt called during lunch left msg that she was returning Melissa's call .  --glh

## 2019-04-18 ENCOUNTER — Other Ambulatory Visit: Payer: Self-pay | Admitting: Family Medicine

## 2019-04-18 DIAGNOSIS — M797 Fibromyalgia: Secondary | ICD-10-CM

## 2019-04-18 NOTE — Addendum Note (Signed)
Addended by: Lanier Prude D on: 04/18/2019 05:34 PM   Modules accepted: Orders

## 2019-04-18 NOTE — Telephone Encounter (Signed)
Patient is requesting refill on Lyrica - Patient no longer see Dr. Lenna Gilford.  Please review and advise. MPulliam, CMA/RT(R)

## 2019-04-18 NOTE — Telephone Encounter (Signed)
Patient called back and states there seems to be a mix up with the Lyrica refill and wants to discuss this further with Melissa. She states she only saw Dr. Lenna Gilford twice and has since been d/c'd from care there as Dr. Lenna Gilford thinks there is nothing more she can do for the patient. Patient states she has always got the Lyrica from her family doctor and was also refilled by [place] last time too. She would like to speak with this in more depth with Melissa to have this resolved please.

## 2019-04-18 NOTE — Telephone Encounter (Signed)
Sent to Dr. Raliegh Scarlet to review. >mp

## 2019-04-19 MED ORDER — PREGABALIN 75 MG PO CAPS: 75.0000 mg | ORAL_CAPSULE | Freq: Two times a day (BID) | ORAL | 0 refills | Status: DC

## 2019-04-19 NOTE — Telephone Encounter (Signed)
Pt needs OV with bldwrk- chol panel, A1c etc.  She was told to f/up 35mo --> that was 4 mo ago.  Please advise pt that she needs to make a f/up - come fasting prior for fbw- then ov 3 d later

## 2019-05-11 ENCOUNTER — Ambulatory Visit: Payer: BLUE CROSS/BLUE SHIELD | Admitting: Family Medicine

## 2019-06-08 ENCOUNTER — Emergency Department (HOSPITAL_BASED_OUTPATIENT_CLINIC_OR_DEPARTMENT_OTHER): Payer: BC Managed Care – PPO

## 2019-06-08 ENCOUNTER — Observation Stay (HOSPITAL_COMMUNITY): Payer: BC Managed Care – PPO

## 2019-06-08 ENCOUNTER — Other Ambulatory Visit: Payer: Self-pay

## 2019-06-08 ENCOUNTER — Observation Stay (HOSPITAL_BASED_OUTPATIENT_CLINIC_OR_DEPARTMENT_OTHER)
Admission: EM | Admit: 2019-06-08 | Discharge: 2019-06-09 | Disposition: A | Discharge status: Home / Self Care | Payer: BC Managed Care – PPO | Attending: Family Medicine | Admitting: Family Medicine

## 2019-06-08 ENCOUNTER — Encounter (HOSPITAL_BASED_OUTPATIENT_CLINIC_OR_DEPARTMENT_OTHER): Payer: Self-pay | Admitting: Emergency Medicine

## 2019-06-08 DIAGNOSIS — I10 Essential (primary) hypertension: Secondary | ICD-10-CM | POA: Diagnosis present

## 2019-06-08 DIAGNOSIS — I639 Cerebral infarction, unspecified: Secondary | ICD-10-CM | POA: Diagnosis not present

## 2019-06-08 DIAGNOSIS — M810 Age-related osteoporosis without current pathological fracture: Secondary | ICD-10-CM | POA: Diagnosis not present

## 2019-06-08 DIAGNOSIS — I6389 Other cerebral infarction: Principal | ICD-10-CM | POA: Insufficient documentation

## 2019-06-08 DIAGNOSIS — I6782 Cerebral ischemia: Secondary | ICD-10-CM | POA: Diagnosis not present

## 2019-06-08 DIAGNOSIS — E785 Hyperlipidemia, unspecified: Secondary | ICD-10-CM

## 2019-06-08 DIAGNOSIS — M818 Other osteoporosis without current pathological fracture: Secondary | ICD-10-CM

## 2019-06-08 DIAGNOSIS — M797 Fibromyalgia: Secondary | ICD-10-CM | POA: Diagnosis not present

## 2019-06-08 DIAGNOSIS — Z03818 Encounter for observation for suspected exposure to other biological agents ruled out: Secondary | ICD-10-CM | POA: Insufficient documentation

## 2019-06-08 DIAGNOSIS — I6381 Other cerebral infarction due to occlusion or stenosis of small artery: Secondary | ICD-10-CM | POA: Diagnosis present

## 2019-06-08 DIAGNOSIS — R202 Paresthesia of skin: Secondary | ICD-10-CM | POA: Diagnosis not present

## 2019-06-08 DIAGNOSIS — R200 Anesthesia of skin: Secondary | ICD-10-CM

## 2019-06-08 DIAGNOSIS — G459 Transient cerebral ischemic attack, unspecified: Secondary | ICD-10-CM

## 2019-06-08 HISTORY — DX: Fibromyalgia: M79.7

## 2019-06-08 LAB — CBC
HCT: 40.3 % (ref 36.0–46.0)
HCT: 39.1 % (ref 36.0–46.0)
Hemoglobin: 12.9 g/dL (ref 12.0–15.0)
Hemoglobin: 13 g/dL (ref 12.0–15.0)
MCH: 29.8 pg (ref 26.0–34.0)
MCH: 30.3 pg (ref 26.0–34.0)
MCHC: 32 g/dL (ref 30.0–36.0)
MCHC: 33.2 g/dL (ref 30.0–36.0)
MCV: 93.1 fL (ref 80.0–100.0)
MCV: 91.1 fL (ref 80.0–100.0)
Platelets: 205 K/uL (ref 150–400)
Platelets: 202 10*3/uL (ref 150–400)
RBC: 4.33 MIL/uL (ref 3.87–5.11)
RBC: 4.29 MIL/uL (ref 3.87–5.11)
RDW: 13.1 % (ref 11.5–15.5)
RDW: 13.1 % (ref 11.5–15.5)
WBC: 6.4 K/uL (ref 4.0–10.5)
WBC: 7.2 10*3/uL (ref 4.0–10.5)
nRBC: 0 % (ref 0.0–0.2)
nRBC: 0 % (ref 0.0–0.2)

## 2019-06-08 LAB — COMPREHENSIVE METABOLIC PANEL
ALT: 30 U/L (ref 0–44)
AST: 24 U/L (ref 15–41)
Albumin: 4.2 g/dL (ref 3.5–5.0)
Alkaline Phosphatase: 56 U/L (ref 38–126)
Anion gap: 9 (ref 5–15)
BUN: 18 mg/dL (ref 8–23)
CO2: 28 mmol/L (ref 22–32)
Calcium: 9.5 mg/dL (ref 8.9–10.3)
Chloride: 103 mmol/L (ref 98–111)
Creatinine, Ser: 1 mg/dL (ref 0.44–1.00)
GFR calc Af Amer: >60 mL/min (ref >60)
GFR calc non Af Amer: 57 mL/min — ABNORMAL LOW (ref >60)
Glucose, Bld: 98 mg/dL (ref 70–99)
Potassium: 4.1 mmol/L (ref 3.5–5.1)
Sodium: 140 mmol/L (ref 135–145)
Total Bilirubin: 0.7 mg/dL (ref 0.3–1.2)
Total Protein: 7.3 g/dL (ref 6.5–8.1)

## 2019-06-08 LAB — CREATININE, SERUM
Creatinine, Ser: 0.95 mg/dL (ref 0.44–1.00)
GFR calc Af Amer: >60 mL/min (ref >60)
GFR calc non Af Amer: >60 mL/min (ref >60)

## 2019-06-08 LAB — SARS CORONAVIRUS 2 BY RT PCR (HOSPITAL ORDER, PERFORMED IN ~~LOC~~ HOSPITAL LAB): SARS Coronavirus 2: NEGATIVE

## 2019-06-08 MED ORDER — STROKE: EARLY STAGES OF RECOVERY BOOK
Freq: Once | Status: AC
  Administered 2019-06-08: 18:00:00
  Filled 2019-06-08: qty 1

## 2019-06-08 MED ORDER — ATORVASTATIN CALCIUM 80 MG PO TABS
80.0000 mg | ORAL_TABLET | Freq: Every day | ORAL | Status: DC
  Administered 2019-06-09: 80 mg via ORAL
  Filled 2019-06-08: qty 1

## 2019-06-08 MED ORDER — ATORVASTATIN CALCIUM 10 MG PO TABS: 20.0000 mg | ORAL_TABLET | Freq: Every day | ORAL | Status: DC

## 2019-06-08 MED ORDER — ACETAMINOPHEN 650 MG RE SUPP: 650.0000 mg | RECTAL | Status: DC | PRN

## 2019-06-08 MED ORDER — ACETAMINOPHEN 160 MG/5ML PO SOLN: 650.0000 mg | ORAL | Status: DC | PRN

## 2019-06-08 MED ORDER — ACETAMINOPHEN 325 MG PO TABS: 650.0000 mg | ORAL_TABLET | ORAL | Status: DC | PRN

## 2019-06-08 MED ORDER — ASPIRIN EC 81 MG PO TBEC
81.0000 mg | DELAYED_RELEASE_TABLET | Freq: Every day | ORAL | Status: DC
  Administered 2019-06-08: 81 mg via ORAL
  Filled 2019-06-08: qty 1

## 2019-06-08 MED ORDER — PREGABALIN 75 MG PO CAPS
75.0000 mg | ORAL_CAPSULE | Freq: Every day | ORAL | Status: DC
  Administered 2019-06-09: 75 mg via ORAL
  Filled 2019-06-08: qty 1

## 2019-06-08 MED ORDER — SENNOSIDES-DOCUSATE SODIUM 8.6-50 MG PO TABS: 1.0000 | ORAL_TABLET | Freq: Every evening | ORAL | Status: DC | PRN

## 2019-06-08 MED ORDER — VITAMIN D 25 MCG (1000 UNIT) PO TABS
2000.0000 [IU] | ORAL_TABLET | Freq: Every day | ORAL | Status: DC
  Administered 2019-06-09: 2000 [IU] via ORAL
  Filled 2019-06-08: qty 2

## 2019-06-08 MED ORDER — ASPIRIN EC 325 MG PO TBEC
325.0000 mg | DELAYED_RELEASE_TABLET | Freq: Every day | ORAL | Status: DC
  Administered 2019-06-09: 325 mg via ORAL
  Filled 2019-06-08: qty 1

## 2019-06-08 MED ORDER — LORAZEPAM 2 MG/ML IJ SOLN
1.0000 mg | Freq: Once | INTRAMUSCULAR | Status: AC
  Administered 2019-06-08: 1 mg via INTRAVENOUS
  Filled 2019-06-08 (×2): qty 1

## 2019-06-08 MED ORDER — LATANOPROST 0.005 % OP SOLN
1.0000 [drp] | Freq: Every day | OPHTHALMIC | Status: DC
  Administered 2019-06-08: 1 [drp] via OPHTHALMIC
  Filled 2019-06-08: qty 2.5

## 2019-06-08 MED ORDER — GADOBUTROL 1 MMOL/ML IV SOLN
7.0000 mL | Freq: Once | INTRAVENOUS | Status: AC | PRN
  Administered 2019-06-08: 7 mL via INTRAVENOUS

## 2019-06-08 MED ORDER — ENOXAPARIN SODIUM 40 MG/0.4ML ~~LOC~~ SOLN
40.0000 mg | SUBCUTANEOUS | Status: DC
  Administered 2019-06-08: 40 mg via SUBCUTANEOUS
  Filled 2019-06-08: qty 0.4

## 2019-06-08 NOTE — ED Notes (Signed)
Up to BR, gait steady, denies dizziness ?

## 2019-06-08 NOTE — H&P (Signed)
[facility] History and Physical  Pam Scott XIH:[id number] DOB: 1946/03/12 DOA: 06/08/2019  PCP: [name], DO  Patient coming from: Home  Chief Complaint: Right sided numbness  HPI: Pam Scott is a 73 y.o. female with a medical history of hypertension, hyperlipidemia, fibromyalgia, who presented to the emergency department at med center [facility] with complaints of right-sided facial numbness as well as right arm and right leg.  Symptoms started yesterday 06/07/2019 at approximate noon with numbness around the lips.  As the night progressed and early today, symptoms worsen to include right side of the face (eye down), right arm and right leg.  Patient does state that she got bit by a bug a few days ago and has been scratching that area on her leg.  She denies any weakness but does feel that her gait is unsteady given her numbness.  Patient denies any recent illness or sickness.  She did recently travel to [place] to help take care of her son's children.  She denies any current chest pain, shortness of breath, abdominal pain, nausea or vomiting, diarrhea or constipation, dizziness or headache, problems with urination.   ED Course: Presented with right-sided numbness.  CT head unremarkable.  TRH called for admission.  Review of Systems:  All other systems reviewed and are negative.   Past Medical History:  Diagnosis Date   Fibromyalgia    Hyperlipidemia    Hypertension    Osteoporosis     Past Surgical History:  Procedure Laterality Date   ABDOMINAL HYSTERECTOMY     CATARACT EXTRACTION     CYSTOCELE REPAIR     ROTATOR CUFF REPAIR      Social History:  reports that she has never smoked. She has never used smokeless tobacco. She reports current alcohol use of about 5.0 - 7.0 standard drinks of alcohol per week. She reports that she does not use drugs.   Allergies  Allergen Reactions   Amlodipine     edema   Simvastatin     myalgias    Erythromycin     Family History  Problem Relation Age of Onset   Hypertension Father    Hodgkin's lymphoma Mother    Cancer Neg Hx    Hyperlipidemia Neg Hx    Heart disease Neg Hx    Diabetes Neg Hx    Stroke Neg Hx      Prior to Admission medications   Medication Sig Start Date End Date Taking? Authorizing Provider  atorvastatin (LIPITOR) 20 MG tablet TAKE 1 TABLET DAILY 03/21/19  Yes Opalski, Deborah, DO  bimatoprost (LUMIGAN) 0.03 % ophthalmic drops 1 drop at bedtime.     Yes [provider]  Cholecalciferol (VITAMIN D3) 2000 UNITS TABS Take by mouth.     Yes [provider]  hydrochlorothiazide (MICROZIDE) 12.5 MG capsule Take 1 capsule (12.5 mg total) by mouth daily. 04/06/19  Yes Opalski, Deborah, DO  meloxicam (MOBIC) 15 MG tablet TAKE 1 TABLET DAILY 04/04/19  Yes Opalski, Neoma Laming, DO  olmesartan (BENICAR) 20 MG tablet Take 1 tablet (20 mg total) by mouth daily. 01/07/19  Yes Opalski, Neoma Laming, DO  pregabalin (LYRICA) 75 MG capsule Take 1 capsule (75 mg total) by mouth 2 (two) times daily. Patient taking differently: Take 75 mg by mouth daily.  04/19/19  Yes [name], DO    Physical Exam: Vitals:   06/08/19 1321 06/08/19 1503  BP: (!) 174/80 (!) 186/83  Pulse: (!) 58 60  Resp: 16 17  Temp:  98.2  F (36.8 C)  SpO2: 99% 100%     General: Well developed, well nourished, NAD, appears stated age  HEENT: NCAT, PERRLA, EOMI, Anicteic Sclera, mucous membranes moist.   Neck: Supple, no JVD, no masses  Cardiovascular: S1 S2 auscultated, no rubs, murmurs or gallops. Regular rate and rhythm.  Respiratory: Clear to auscultation bilaterally with equal chest rise  Abdomen: Soft, nontender, nondistended, + bowel sounds  Extremities: warm dry without cyanosis clubbing or edema  Neuro: AAOx3, cranial nerves grossly intact. Strength 5/5 in patient's upper and lower extremities bilaterally. States numbness on right side of face, arm, leg  Skin:  Without rashes exudates or nodules  Psych: Normal affect and demeanor with intact judgement and insight  Labs on Admission: I have personally reviewed following labs and imaging studies CBC: Recent Labs  Lab 06/08/19 1024  WBC 6.4  HGB 12.9  HCT 40.3  MCV 93.1  PLT 740   Basic Metabolic Panel: Recent Labs  Lab 06/08/19 1024  NA 140  K 4.1  CL 103  CO2 28  GLUCOSE 98  BUN 18  CREATININE 1.00  CALCIUM 9.5   GFR: Estimated Creatinine Clearance: 50.4 mL/min (by C-G formula based on SCr of 1 mg/dL). Liver Function Tests: Recent Labs  Lab 06/08/19 1024  AST 24  ALT 30  ALKPHOS 56  BILITOT 0.7  PROT 7.3  ALBUMIN 4.2   No results for input(s): LIPASE, AMYLASE in the last 168 hours. No results for input(s): AMMONIA in the last 168 hours. Coagulation Profile: No results for input(s): INR, PROTIME in the last 168 hours. Cardiac Enzymes: No results for input(s): CKTOTAL, CKMB, CKMBINDEX, TROPONINI in the last 168 hours. BNP (last 3 results) No results for input(s): PROBNP in the last 8760 hours. HbA1C: No results for input(s): HGBA1C in the last 72 hours. CBG: No results for input(s): GLUCAP in the last 168 hours. Lipid Profile: No results for input(s): CHOL, HDL, LDLCALC, TRIG, CHOLHDL, LDLDIRECT in the last 72 hours. Thyroid Function Tests: No results for input(s): TSH, T4TOTAL, FREET4, T3FREE, THYROIDAB in the last 72 hours. Anemia Panel: No results for input(s): VITAMINB12, FOLATE, FERRITIN, TIBC, IRON, RETICCTPCT in the last 72 hours. Urine analysis:    Component Value Date/Time   COLORURINE LT. YELLOW 06/23/2013 [place] 06/23/2013 0952   LABSPEC 1.010 06/23/2013 0952   PHURINE 6.0 06/23/2013 0952   GLUCOSEU NEGATIVE 06/23/2013 0952   HGBUR NEGATIVE 06/23/2013 0952   BILIRUBINUR NEGATIVE 06/23/2013 0952   KETONESUR NEGATIVE 06/23/2013 0952   UROBILINOGEN 0.2 06/23/2013 0952   NITRITE NEGATIVE 06/23/2013 0952   LEUKOCYTESUR NEGATIVE  06/23/2013 0952   Sepsis Labs: @LABRCNTIP (procalcitonin:4,lacticidven:4) ) Recent Results (from the past 240 hour(s))  SARS Coronavirus 2 (Performed in [place] hospital lab)     Status: None   Collection Time: 06/08/19 11:54 AM   Specimen: Nasopharyngeal Swab  Result Value Ref Range Status   SARS Coronavirus 2 NEGATIVE NEGATIVE Final    Comment: (NOTE) If result is NEGATIVE SARS-CoV-2 target nucleic acids are NOT DETECTED. The SARS-CoV-2 RNA is generally detectable in upper and lower  respiratory specimens during the acute phase of infection. The lowest  concentration of SARS-CoV-2 viral copies this assay can detect is 250  copies / mL. A negative result does not preclude SARS-CoV-2 infection  and should not be used as the sole basis for treatment or other  patient management decisions.  A negative result may occur with  improper specimen collection / handling, submission of  specimen other  than nasopharyngeal swab, presence of viral mutation(s) within the  areas targeted by this assay, and inadequate number of viral copies  (<250 copies / mL). A negative result must be combined with clinical  observations, patient history, and epidemiological information. If result is POSITIVE SARS-CoV-2 target nucleic acids are DETECTED. The SARS-CoV-2 RNA is generally detectable in upper and lower  respiratory specimens dur ing the acute phase of infection.  Positive  results are indicative of active infection with SARS-CoV-2.  Clinical  correlation with patient history and other diagnostic information is  necessary to determine patient infection status.  Positive results do  not rule out bacterial infection or co-infection with other viruses. If result is PRESUMPTIVE POSTIVE SARS-CoV-2 nucleic acids MAY BE PRESENT.   A presumptive positive result was obtained on the submitted specimen  and confirmed on repeat testing.  While 2019 novel coronavirus  (SARS-CoV-2) nucleic acids may be  present in the submitted sample  additional confirmatory testing may be necessary for epidemiological  and / or clinical management purposes  to differentiate between  SARS-CoV-2 and other Sarbecovirus currently known to infect humans.  If clinically indicated additional testing with an alternate test  methodology [phone]) is advised. The SARS-CoV-2 RNA is generally  detectable in upper and lower respiratory sp ecimens during the acute  phase of infection. The expected result is Negative. Fact Sheet for Patients:  StrictlyIdeas.no Fact Sheet for Healthcare Providers: BankingDealers.co.za This test is not yet approved or cleared by the [place] FDA and has been authorized for detection and/or diagnosis of SARS-CoV-2 by FDA under an Emergency Use Authorization (EUA).  This EUA will remain in effect (meaning this test can be used) for the duration of the COVID-19 declaration under Section 564(b)(1) of the Act, 21 U.S.C. section 360bbb-3(b)(1), unless the authorization is terminated or revoked sooner. Performed at [facility], [address] [place], [place] [address]      Radiological Exams on Admission: Ct Head Wo Contrast  Result Date: 06/08/2019 CLINICAL DATA:  Right facial and extremity numbness. EXAM: CT HEAD WITHOUT CONTRAST TECHNIQUE: Contiguous axial images were obtained from the base of the skull through the vertex without intravenous contrast. COMPARISON:  None. FINDINGS: Brain: Minimal chronic ischemic white matter disease is noted. No mass effect or midline shift is noted. Ventricular size is within normal limits. There is no evidence of mass lesion, hemorrhage or acute infarction. Vascular: No hyperdense vessel or unexpected calcification. Skull: Normal. Negative for fracture or focal lesion. Sinuses/Orbits: No acute finding. Other: None. IMPRESSION: Minimal chronic ischemic white matter disease. No acute  intracranial abnormality seen. Electronically Signed   By: Marijo Conception M.D.   On: 06/08/2019 11:09    EKG: Independently reviewed.  Sinus rhythm, rate 59  Assessment/Plan  Right-sided numbness/paresthesia -Patient's face and lips on 06/07/2019 at approximately 12:00 PM.  Has now progressed to the arm as well as the right lower extremity.  Feels that she also has unsteady gait -CT head: No acute intracranial abnormality  -Will obtain MRI brain, echocardiogram, carotid doppler, LDL, Hemoglobin A1c -Neurology consulted and appreciated -Continue statin  -Patient was told not take aspirin in the past as she was on Mobic.  -have placed patient on aspirin  Essential hypertension -Hold antihypertensive medications and allow for permissive hypertension  Hyperlipidemia -Continue statin, will also obtain Lipid panel  Fibromyalgia -Continue Lyrica  Osteoporosis -Continue vitamin D supplementation  DVT prophylaxis: Lovenox  Code Status: Full  Family Communication: None  at bedside. Admission, patients condition and plan of care including tests being ordered have been discussed with the patient, who indicates understanding and agrees with the plan and Code Status.  Disposition Plan: Home   Consults called: Neurology    Admission status: Observation   Time spent: 70 minutes  Talbert Trembath D.O. [facility]  Between 7am to 7pm - Please see pager noted on [email or web address]  After 7pm go to [email or web address] 06/08/2019, 4:20 PM

## 2019-06-08 NOTE — ED Notes (Signed)
ED Provider at bedside. 

## 2019-06-08 NOTE — ED Notes (Signed)
Patient transported to CT 

## 2019-06-08 NOTE — ED Notes (Signed)
Ambulated to BR, gait steady 

## 2019-06-08 NOTE — ED Notes (Signed)
Report given to [place] with [facility] and attempted to given Anderson Malta RN, receiving  nurse report, was tied up and will call back for report

## 2019-06-08 NOTE — ED Provider Notes (Signed)
[facility] Emergency Department Provider Note MRN:  [id number]  Arrival date & time: 06/08/19     Chief Complaint   Numbness   History of Present Illness   Pam Scott is a 73 y.o. year-old female with a history of hypertension, hyperlipidemia presenting to the ED with chief complaint of numbness.  Since 12:00 yesterday, patient is endorsing numbness to the right side of her face as well as the right arm.  Symptoms have been constant since that time.  She denies weakness, no headache, no vomiting, no vision change, no issues with gait.  She also thinks she sustained a insect bite to her leg and is wondering if this is related.  Review of Systems  A complete 10 system review of systems was obtained and all systems are negative except as noted in the HPI and PMH.   Patient's Health History    Past Medical History:  Diagnosis Date  . Fibromyalgia   . Hyperlipidemia   . Hypertension   . Osteoporosis     Past Surgical History:  Procedure Laterality Date  . ABDOMINAL HYSTERECTOMY    . CATARACT EXTRACTION    . CYSTOCELE REPAIR    . ROTATOR CUFF REPAIR      Family History  Problem Relation Age of Onset  . Hypertension Father   . Hodgkin's lymphoma Mother   . Cancer Neg Hx   . Hyperlipidemia Neg Hx   . Heart disease Neg Hx   . Diabetes Neg Hx   . Stroke Neg Hx     Social History   Socioeconomic History  . Marital status: Married    Spouse name: Not on file  . Number of children: Not on file  . Years of education: Not on file  . Highest education level: Not on file  Occupational History  . Not on file  Social Needs  . Financial resource strain: Not on file  . Food insecurity    Worry: Not on file    Inability: Not on file  . Transportation needs    Medical: Not on file    Non-medical: Not on file  Tobacco Use  . Smoking status: Never Smoker  . Smokeless tobacco: Never Used  Substance and Sexual Activity  . Alcohol use: Yes   Alcohol/week: 5.0 - 7.0 standard drinks    Types: 5 - 7 Standard drinks or equivalent per week  . Drug use: No  . Sexual activity: Yes    Birth control/protection: Post-menopausal  Lifestyle  . Physical activity    Days per week: Not on file    Minutes per session: Not on file  . Stress: Not on file  Relationships  . Social Herbalist on phone: Not on file    Gets together: Not on file    Attends religious service: Not on file    Active member of club or organization: Not on file    Attends meetings of clubs or organizations: Not on file    Relationship status: Not on file  . Intimate partner violence    Fear of current or ex partner: Not on file    Emotionally abused: Not on file    Physically abused: Not on file    Forced sexual activity: Not on file  Other Topics Concern  . Not on file  Social History Narrative  . Not on file     Physical Exam  Vital Signs and Nursing Notes reviewed Vitals:   06/08/19  1110 06/08/19 1111  BP: (!) 166/93   Pulse:    Resp: 15 20  Temp:    SpO2: 98% 97%    CONSTITUTIONAL: Well-appearing, NAD NEURO:  Alert and oriented x 3, moderate decrease sensation to the right side of the face and the right arm, no pronator drift, normal sensation and strength to the legs, question of subtle right-sided facial droop EYES:  eyes equal and reactive ENT/NECK:  no LAD, no JVD CARDIO: Regular rate, well-perfused, normal S1 and S2 PULM:  CTAB no wheezing or rhonchi GI/GU:  normal bowel sounds, non-distended, non-tender MSK/SPINE:  No gross deformities, no edema SKIN:  no rash, atraumatic PSYCH:  Appropriate speech and behavior  Diagnostic and Interventional Summary    EKG Interpretation  Date/Time:  Wednesday June 08 2019 10:48:03 EDT Ventricular Rate:  59 PR Interval:    QRS Duration: 92 QT Interval:  457 QTC Calculation: 453 R Axis:   59 Text Interpretation:  Sinus rhythm Baseline wander in lead(s) V6 Confirmed by Gerlene Fee  [phone]) on 06/08/2019 11:19:28 AM      Labs Reviewed  COMPREHENSIVE METABOLIC PANEL - Abnormal; Notable for the following components:      Result Value   GFR calc non Af Amer 57 (*)    All other components within normal limits  SARS CORONAVIRUS 2 (HOSPITAL ORDER, [place] [facility])  CBC    CT HEAD WO CONTRAST  Final Result      Medications - No data to display   Procedures Critical Care Critical Care Documentation Critical care time provided by me (excluding procedures): 35 minutes  Condition necessitating critical care: Concern for acute ischemic stroke  Components of critical care management: reviewing of prior records, laboratory and imaging interpretation, frequent re-examination and reassessment of vital signs, discussion with consulting services, facilitation of transfer.    ED Course and Medical Decision Making  I have reviewed the triage vital signs and the nursing notes.  Pertinent labs & imaging results that were available during my care of the patient were reviewed by me and considered in my medical decision making (see below for details).  Concern for acute ischemic stroke in this 73 year old female with neurological deficits, last known normal greater than 10 hours ago, Lucianne Lei negative, code stroke initiation is not indicated.  CT Noncon is without acute bleeding or abnormality.  Spoke with Dr. Lorraine Lax neurology, patient is appropriate for admission to hospital service, transport to [facility] where patient can be evaluated by neurology and likely obtain MRI thereafter.  Barth Kirks. Sedonia Small, [place] [email or web address]  Final Clinical Impressions(s) / ED Diagnoses     ICD-10-CM   1. Acute ischemic stroke [facility])  I63.9     ED Discharge Orders    None         Maudie Flakes, MD 06/08/19 1139

## 2019-06-08 NOTE — ED Notes (Signed)
Report given to Juliette Mangle, receiving nurse at [facility]

## 2019-06-08 NOTE — Consult Note (Addendum)
Neurology Consultation  Reason for Consult: Stroke Referring Physician: Monica Becton  CC: Decreased sensation on right face, arm, leg  History is obtained from: Patient  HPI: Pam Scott is a 73 y.o. female with history of osteoporosis, hypertension, hyperlipidemia and fibromyalgia.  Patient states that yesterday at approximately 12 PM she noted that her right face, hand, foot went numb but did not think much of it.  She went to sleep that night and upon waking this morning she noted that the right face, arm, leg were also numb however this time it was the whole arm and leg.  Due to this patient came to the emergency department and fear that she might of had a stroke.  Does not take an 81 mg aspirin and states that her blood pressure is often well controlled.  Patient has not had a stroke before that she knows of.  Patient does not smoke.  She has no other complaints at this time.   LKW: 12 PM on 06/07/2019 tpa given?: no, out of window Premorbid modified Rankin scale (mRS): 0 NIH stroke score of 1   ROS: A 14 point ROS was performed and is negative except as noted in the HPI.   Past Medical History:  Diagnosis Date  . Fibromyalgia   . Hyperlipidemia   . Hypertension   . Osteoporosis      Family History  Problem Relation Age of Onset  . Hypertension Father   . Hodgkin's lymphoma Mother   . Cancer Neg Hx   . Hyperlipidemia Neg Hx   . Heart disease Neg Hx   . Diabetes Neg Hx   . Stroke Neg Hx      Social History:   reports that she has never smoked. She has never used smokeless tobacco. She reports current alcohol use of about 5.0 - 7.0 standard drinks of alcohol per week. She reports that she does not use drugs.  Medications  Current Facility-Administered Medications:  .   stroke: mapping our early stages of recovery book, , Does not apply, Once, [name], DO .  acetaminophen (TYLENOL) tablet 650 mg, 650 mg, Oral, Q4H PRN **OR** acetaminophen (TYLENOL) solution  650 mg, 650 mg, Per Tube, Q4H PRN **OR** acetaminophen (TYLENOL) suppository 650 mg, 650 mg, Rectal, Q4H PRN, Mikhail, Maryann, DO .  atorvastatin (LIPITOR) tablet 20 mg, 20 mg, Oral, Daily, Mikhail, Maryann, DO .  enoxaparin (LOVENOX) injection 40 mg, 40 mg, Subcutaneous, Q24H, Mikhail, Maryann, DO .  latanoprost (XALATAN) 0.005 % ophthalmic solution 1 drop, 1 drop, Both Eyes, QHS, Mikhail, [place], DO .  LORazepam (ATIVAN) injection 1 mg, 1 mg, Intravenous, Once, [name], DO .  pregabalin (LYRICA) capsule 75 mg, 75 mg, Oral, Daily, Mikhail, [place], DO .  senna-docusate (Senokot-S) tablet 1 tablet, 1 tablet, Oral, QHS PRN, [name], DO .  Vitamin D3 TABS 2,000 Units, 2,000 Units, Oral, Daily, [name], DO   Exam: Current vital signs: BP (!) 186/83 (BP Location: Right Arm)   Pulse 60   Temp 98.2 F (36.8 C) (Oral)   Resp 17   Ht 5\' 4"  (1.626 m)   Wt 72.7 kg   SpO2 100%   BMI 27.52 kg/m  Vital signs in last 24 hours: Temp:  [98.2 F (36.8 C)-98.5 F (36.9 C)] 98.2 F (36.8 C) (07/22 1503) Pulse Rate:  [54-65] 60 (07/22 1503) Resp:  [15-20] 17 (07/22 1503) BP: (163-191)/(76-99) 186/83 (07/22 1503) SpO2:  [95 %-100 %] 100 % (07/22 1503) Weight:  [72.7 kg] 72.7  kg (07/22 1006)  Physical Exam  Constitutional: Appears well-developed and well-nourished.  Psych: Affect appropriate to situation Eyes: No scleral injection HENT: No OP obstrucion Head: Normocephalic.  Cardiovascular: Normal rate and regular rhythm.  Respiratory: Effort normal, non-labored breathing GI: Soft.  No distension. There is no tenderness.  Skin: WDI  Neuro: Mental Status: Patient is awake, alert, oriented to person, place, month, year, and situation. Patient is able to give a clear and coherent history. No signs of aphasia or neglect Cranial Nerves: II: Visual Fields are full.  III,IV, VI: EOMI without ptosis or diploplia. Pupils equal, round and reactive to light V: Facial  sensation is symmetric to temperature VII: Mild right facial droop VIII: hearing is intact to voice X: Palat elevates symmetrically XI: Shoulder shrug is symmetric. XII: tongue is midline without atrophy or fasciculations.  Motor: Tone is normal. Bulk is normal. 5/5 strength was present in all four extremities.  Sensory: Sensation is symmetric to light touch and temperature on the left.  On the right patient has decreased sensation to light touch on the right face, arm, leg Deep Tendon Reflexes: 2+ and symmetric in the biceps and patellae.  Plantars: Toes are downgoing bilaterally.  Cerebellar: FNF shows dysmetria with the right side not the left side and HKS are intact bilaterally  Labs I have reviewed labs in epic and the results pertinent to this consultation are:   CBC    Component Value Date/Time   WBC 6.4 06/08/2019 1024   RBC 4.33 06/08/2019 1024   HGB 12.9 06/08/2019 1024   HGB 13.2 12/21/2018 1138   HCT 40.3 06/08/2019 1024   HCT 38.5 12/21/2018 1138   PLT 205 06/08/2019 1024   PLT 197 12/21/2018 1138   MCV 93.1 06/08/2019 1024   MCV 89 12/21/2018 1138   MCH 29.8 06/08/2019 1024   MCHC 32.0 06/08/2019 1024   RDW 13.1 06/08/2019 1024   RDW 13.2 12/21/2018 1138   LYMPHSABS 2.2 12/21/2018 1138   MONOABS 0.7 03/03/2017 1158   EOSABS 0.2 12/21/2018 1138   BASOSABS 0.0 12/21/2018 1138    CMP     Component Value Date/Time   NA 140 06/08/2019 1024   NA 143 12/21/2018 1138   K 4.1 06/08/2019 1024   CL 103 06/08/2019 1024   CO2 28 06/08/2019 1024   GLUCOSE 98 06/08/2019 1024   BUN 18 06/08/2019 1024   BUN 15 12/21/2018 1138   CREATININE 1.00 06/08/2019 1024   CALCIUM 9.5 06/08/2019 1024   PROT 7.3 06/08/2019 1024   PROT 7.1 12/21/2018 1138   ALBUMIN 4.2 06/08/2019 1024   ALBUMIN 4.6 12/21/2018 1138   AST 24 06/08/2019 1024   ALT 30 06/08/2019 1024   ALKPHOS 56 06/08/2019 1024   BILITOT 0.7 06/08/2019 1024   BILITOT 0.5 12/21/2018 1138   GFRNONAA 57 (L)  06/08/2019 1024   GFRAA >60 06/08/2019 1024    Lipid Panel     Component Value Date/Time   CHOL 196 12/21/2018 1138   TRIG 94 12/21/2018 1138   HDL 79 12/21/2018 1138   CHOLHDL 2.5 12/21/2018 1138   CHOLHDL 2 06/07/2018 1014   VLDL 21.2 06/07/2018 1014   LDLCALC 98 12/21/2018 1138   LDLDIRECT 104 (H) 12/21/2018 1138   LDLDIRECT 159.9 06/23/2013 0952     Imaging I have reviewed the images obtained:  CT-scan of the brain- minimal chronic ischemic white matter disease  MRI examination of the brain-pending  Etta Quill PA-C [facility] [phone]  M-F  (  9:00 am- 5:00 PM)  06/08/2019, 4:30 PM     Assessment: 73 year old female presented to the hospital secondary to right-sided decreased sensation of both face arm and leg.  She also has mild left facial droop.  Likely small vessel thalamic infarct however given the progression of symptoms must also look at larger vessels such as MCA and carotids.  Patient will be admitted for further stroke work-up.   Suspect acute ischemic stroke  Recommend # MRI of the brain without contrast #MRA Head and neck  #Transthoracic Echo,  #Continue patient on ASA 325mg  daily,  #Start  Atorvastatin 80 mg/other high intensity statin # BP goal: permissive HTN upto 220/120 mmHg # HBAIC and Lipid profile # Telemetry monitoring # Frequent neuro checks # NPO until passes stroke swallow screen # please page stroke NP  Or  PA  Or MD from 8am -4 pm  as this patient from this time will be  followed by the stroke.   You can look them up on [email or web address]  Password [email or web address]   NEUROHOSPITALIST ADDENDUM Performed a face to face diagnostic evaluation.   I have reviewed the contents of history and physical exam as documented by PA/ARNP/Resident and agree with above documentation.  I have discussed and formulated the above plan as documented. Edits to the note have been made as needed.  Patient with uncontrolled hypertension presents with right  face arm and leg numbness and also mild right facial droop. Likely has small lacunar infarct, MRI brain pending.  Will need to be on dual antiplatelets for 3 weeks with aspirin and Plavix.  Also need to check lipid profile.  Vascular imaging and echocardiogram pending.  Stroke team to follow    Karena Addison  MD [facility] [phone]   If 7pm to 7am, please call on call as listed on AMION.

## 2019-06-08 NOTE — ED Triage Notes (Addendum)
About 1200 yesterday, started having numbness to  lips and right foot, and  noticed that was itching from a insect bite about this time. This am, right side of face numb and radiates to right shoulder, arm, hip and leg. No itching today

## 2019-06-08 NOTE — Plan of Care (Signed)
Progressing towards goals

## 2019-06-09 ENCOUNTER — Observation Stay (HOSPITAL_BASED_OUTPATIENT_CLINIC_OR_DEPARTMENT_OTHER): Payer: BC Managed Care – PPO

## 2019-06-09 ENCOUNTER — Observation Stay (HOSPITAL_COMMUNITY): Payer: BC Managed Care – PPO

## 2019-06-09 ENCOUNTER — Telehealth: Payer: Self-pay

## 2019-06-09 DIAGNOSIS — I639 Cerebral infarction, unspecified: Secondary | ICD-10-CM | POA: Diagnosis not present

## 2019-06-09 DIAGNOSIS — I10 Essential (primary) hypertension: Secondary | ICD-10-CM

## 2019-06-09 LAB — HEMOGLOBIN A1C
Hgb A1c MFr Bld: 6.1 % — ABNORMAL HIGH (ref 4.8–5.6)
Mean Plasma Glucose: 128.37 mg/dL

## 2019-06-09 LAB — LIPID PANEL
Cholesterol: 164 mg/dL (ref 0–200)
HDL: 64 mg/dL (ref >40)
LDL Cholesterol: 77 mg/dL (ref 0–99)
Total CHOL/HDL Ratio: 2.6 RATIO
Triglycerides: 115 mg/dL (ref <150)
VLDL: 23 mg/dL (ref 0–40)

## 2019-06-09 LAB — ECHOCARDIOGRAM COMPLETE
Height: 64 in
Weight: 2564.8 oz

## 2019-06-09 MED ORDER — ATORVASTATIN CALCIUM 40 MG PO TABS: 40.0000 mg | ORAL_TABLET | Freq: Every day | ORAL | 0 refills | Status: DC

## 2019-06-09 MED ORDER — CLOPIDOGREL BISULFATE 75 MG PO TABS: 75.0000 mg | ORAL_TABLET | Freq: Every day | ORAL | 0 refills | Status: DC

## 2019-06-09 MED ORDER — HYDROCHLOROTHIAZIDE 12.5 MG PO CAPS: 12.5000 mg | ORAL_CAPSULE | Freq: Every day | ORAL | 1 refills | Status: DC

## 2019-06-09 MED ORDER — PERFLUTREN LIPID MICROSPHERE
1.0000 mL | INTRAVENOUS | Status: AC | PRN
  Administered 2019-06-09: 3 mL via INTRAVENOUS
  Filled 2019-06-09: qty 10

## 2019-06-09 MED ORDER — ASPIRIN 325 MG PO TBEC: 325.0000 mg | DELAYED_RELEASE_TABLET | Freq: Every day | ORAL | 0 refills | Status: DC

## 2019-06-09 MED ORDER — ASPIRIN 81 MG PO TBEC: 81.0000 mg | DELAYED_RELEASE_TABLET | Freq: Every day | ORAL | 0 refills | Status: AC

## 2019-06-09 MED ORDER — OLMESARTAN MEDOXOMIL 20 MG PO TABS: 20.0000 mg | ORAL_TABLET | Freq: Every day | ORAL | Status: DC

## 2019-06-09 NOTE — Progress Notes (Signed)
Patient being discharged home.  Patient to be transported by her husband.  IV removed with the catheter intact.  Discharge instructions and prescription information given to the patient who verbalized understanding.

## 2019-06-09 NOTE — Progress Notes (Signed)
STROKE TEAM PROGRESS NOTE   INTERVAL HISTORY I personally reviewed history of presenting illness with the patient in details.  She presented with sudden onset of right face and body paresthesias which have persisted.  MRI scan confirms a small thalamic infarct.  MRA of the brain and neck did not show significant large vessel extracranial or intracranial stenosis.  LDL cholesterol 77 mg percent.  Hemoglobin A1c 6.1.  Transthoracic echo shows normal ejection fraction without cardiac source of embolism. Vitals:   06/08/19 2220 06/08/19 2328 06/09/19 0340 06/09/19 0755  BP: 117/83 (!) 146/87 127/71 (!) 141/89  Pulse: 74 75 71 69  Resp: 16 15 13 16   Temp: 98.5 F (36.9 C) (!) 97.4 F (36.3 C) 98 F (36.7 C) 98.2 F (36.8 C)  TempSrc: Axillary Oral Oral Oral  SpO2: 95% 97% 96% 95%  Weight:      Height:        CBC:  Recent Labs  Lab 06/08/19 1024 06/08/19 1653  WBC 6.4 7.2  HGB 12.9 13.0  HCT 40.3 39.1  MCV 93.1 91.1  PLT 205 229    Basic Metabolic Panel:  Recent Labs  Lab 06/08/19 1024 06/08/19 1653  NA 140  --   K 4.1  --   CL 103  --   CO2 28  --   GLUCOSE 98  --   BUN 18  --   CREATININE 1.00 0.95  CALCIUM 9.5  --    Lipid Panel:     Component Value Date/Time   CHOL 164 06/09/2019 0335   CHOL 196 12/21/2018 1138   TRIG 115 06/09/2019 0335   HDL 64 06/09/2019 0335   HDL 79 12/21/2018 1138   CHOLHDL 2.6 06/09/2019 0335   VLDL 23 06/09/2019 0335   LDLCALC 77 06/09/2019 0335   LDLCALC 98 12/21/2018 1138   HgbA1c:  Lab Results  Component Value Date   HGBA1C 6.1 (H) 06/09/2019   Urine Drug Screen: No results found for: LABOPIA, COCAINSCRNUR, LABBENZ, AMPHETMU, THCU, LABBARB  Alcohol Level No results found for: [facility]  IMAGING Dg Chest 2 View  Result Date: 06/08/2019 CLINICAL DATA:  Right-sided numbness for 2 days EXAM: CHEST - 2 VIEW COMPARISON:  None. FINDINGS: The heart size and mediastinal contours are within normal limits. Both lungs are clear. The  visualized skeletal structures are unremarkable. IMPRESSION: No active cardiopulmonary disease. Electronically Signed   By: Inez Catalina M.D.   On: 06/08/2019 18:02   Ct Head Wo Contrast  Result Date: 06/08/2019 CLINICAL DATA:  Right facial and extremity numbness. EXAM: CT HEAD WITHOUT CONTRAST TECHNIQUE: Contiguous axial images were obtained from the base of the skull through the vertex without intravenous contrast. COMPARISON:  None. FINDINGS: Brain: Minimal chronic ischemic white matter disease is noted. No mass effect or midline shift is noted. Ventricular size is within normal limits. There is no evidence of mass lesion, hemorrhage or acute infarction. Vascular: No hyperdense vessel or unexpected calcification. Skull: Normal. Negative for fracture or focal lesion. Sinuses/Orbits: No acute finding. Other: None. IMPRESSION: Minimal chronic ischemic white matter disease. No acute intracranial abnormality seen. Electronically Signed   By: Marijo Conception M.D.   On: 06/08/2019 11:09   Mr Angio Head Wo Contrast  Result Date: 06/08/2019 CLINICAL DATA:  Paresthesias with numbness and tingling. Right facial numbness EXAM: MR HEAD WITHOUT CONTRAST MR CIRCLE OF WILLIS WITHOUT CONTRAST MRA OF THE NECK WITHOUT AND WITH CONTRAST TECHNIQUE: Multiplanar, multiecho pulse sequences of the brain, circle of willis  and surrounding structures were obtained without intravenous contrast. Angiographic images of the neck were obtained using MRA technique without and with intravenous contrast. CONTRAST:  7 mL Gadavist COMPARISON:  None. FINDINGS: MR HEAD FINDINGS BRAIN: There is a small focus of abnormal diffusion restriction at the dorsolateral left thalamus, adjacent to the posterior limb of the internal capsule. No other diffusion abnormality. Multifocal white matter hyperintensity, most commonly due to chronic ischemic microangiopathy. The cerebral and cerebellar volume are age-appropriate. There is no hydrocephalus.  Susceptibility-sensitive sequences show no chronic microhemorrhage or superficial siderosis. The midline structures are normal. There is no midline shift or mass effect. VASCULAR: The major intracranial arterial and venous sinus flow voids are normal. SKULL AND UPPER CERVICAL SPINE: Calvarial bone marrow signal is normal. There is no skull base mass. The visualized upper cervical spine and soft tissues are normal. SINUSES/ORBITS: There are no fluid levels or advanced mucosal thickening. The mastoid air cells and middle ear cavities are free of fluid. The orbits are normal. MR CIRCLE OF WILLIS FINDINGS POSTERIOR CIRCULATION: --Vertebral arteries: Normal V4 segments. --Posterior inferior cerebellar arteries (PICA): Patent origins from the vertebral arteries. --Anterior inferior cerebellar arteries (AICA): Not clearly visualized, though this is not uncommon. --Basilar artery: Normal. --Superior cerebellar arteries: Normal. --Posterior cerebral arteries (PCA): Normal. There are bilateral posterior communicating arteries (p-comm) that partially supply the PCAs. ANTERIOR CIRCULATION: --Intracranial internal carotid arteries: Normal. --Anterior cerebral arteries (ACA): Normal. Both A1 segments are present. Patent anterior communicating artery (a-comm). --Middle cerebral arteries (MCA): Normal. MRA NECK FINDINGS There is no occlusion or stenosis of the vertebral or carotid arteries. IMPRESSION: 1. Small vessel infarct of the dorsolateral left thalamus, in close proximity to the posterior limb of the left internal capsule, in keeping with reported right-sided symptoms. 2. Normal intracranial and cervical MRA. 3. No intracranial hemorrhage or mass effect. Electronically Signed   By: Ulyses Jarred M.D.   On: 06/08/2019 23:07   Mr Angio Neck W Wo Contrast  Result Date: 06/08/2019 CLINICAL DATA:  Paresthesias with numbness and tingling. Right facial numbness EXAM: MR HEAD WITHOUT CONTRAST MR CIRCLE OF WILLIS WITHOUT CONTRAST  MRA OF THE NECK WITHOUT AND WITH CONTRAST TECHNIQUE: Multiplanar, multiecho pulse sequences of the brain, circle of willis and surrounding structures were obtained without intravenous contrast. Angiographic images of the neck were obtained using MRA technique without and with intravenous contrast. CONTRAST:  7 mL Gadavist COMPARISON:  None. FINDINGS: MR HEAD FINDINGS BRAIN: There is a small focus of abnormal diffusion restriction at the dorsolateral left thalamus, adjacent to the posterior limb of the internal capsule. No other diffusion abnormality. Multifocal white matter hyperintensity, most commonly due to chronic ischemic microangiopathy. The cerebral and cerebellar volume are age-appropriate. There is no hydrocephalus. Susceptibility-sensitive sequences show no chronic microhemorrhage or superficial siderosis. The midline structures are normal. There is no midline shift or mass effect. VASCULAR: The major intracranial arterial and venous sinus flow voids are normal. SKULL AND UPPER CERVICAL SPINE: Calvarial bone marrow signal is normal. There is no skull base mass. The visualized upper cervical spine and soft tissues are normal. SINUSES/ORBITS: There are no fluid levels or advanced mucosal thickening. The mastoid air cells and middle ear cavities are free of fluid. The orbits are normal. MR CIRCLE OF WILLIS FINDINGS POSTERIOR CIRCULATION: --Vertebral arteries: Normal V4 segments. --Posterior inferior cerebellar arteries (PICA): Patent origins from the vertebral arteries. --Anterior inferior cerebellar arteries (AICA): Not clearly visualized, though this is not uncommon. --Basilar artery: Normal. --Superior cerebellar arteries: Normal. --Posterior  cerebral arteries (PCA): Normal. There are bilateral posterior communicating arteries (p-comm) that partially supply the PCAs. ANTERIOR CIRCULATION: --Intracranial internal carotid arteries: Normal. --Anterior cerebral arteries (ACA): Normal. Both A1 segments are  present. Patent anterior communicating artery (a-comm). --Middle cerebral arteries (MCA): Normal. MRA NECK FINDINGS There is no occlusion or stenosis of the vertebral or carotid arteries. IMPRESSION: 1. Small vessel infarct of the dorsolateral left thalamus, in close proximity to the posterior limb of the left internal capsule, in keeping with reported right-sided symptoms. 2. Normal intracranial and cervical MRA. 3. No intracranial hemorrhage or mass effect. Electronically Signed   By: Ulyses Jarred M.D.   On: 06/08/2019 23:07   Mr Brain Wo Contrast  Result Date: 06/08/2019 CLINICAL DATA:  Paresthesias with numbness and tingling. Right facial numbness EXAM: MR HEAD WITHOUT CONTRAST MR CIRCLE OF WILLIS WITHOUT CONTRAST MRA OF THE NECK WITHOUT AND WITH CONTRAST TECHNIQUE: Multiplanar, multiecho pulse sequences of the brain, circle of willis and surrounding structures were obtained without intravenous contrast. Angiographic images of the neck were obtained using MRA technique without and with intravenous contrast. CONTRAST:  7 mL Gadavist COMPARISON:  None. FINDINGS: MR HEAD FINDINGS BRAIN: There is a small focus of abnormal diffusion restriction at the dorsolateral left thalamus, adjacent to the posterior limb of the internal capsule. No other diffusion abnormality. Multifocal white matter hyperintensity, most commonly due to chronic ischemic microangiopathy. The cerebral and cerebellar volume are age-appropriate. There is no hydrocephalus. Susceptibility-sensitive sequences show no chronic microhemorrhage or superficial siderosis. The midline structures are normal. There is no midline shift or mass effect. VASCULAR: The major intracranial arterial and venous sinus flow voids are normal. SKULL AND UPPER CERVICAL SPINE: Calvarial bone marrow signal is normal. There is no skull base mass. The visualized upper cervical spine and soft tissues are normal. SINUSES/ORBITS: There are no fluid levels or advanced mucosal  thickening. The mastoid air cells and middle ear cavities are free of fluid. The orbits are normal. MR CIRCLE OF WILLIS FINDINGS POSTERIOR CIRCULATION: --Vertebral arteries: Normal V4 segments. --Posterior inferior cerebellar arteries (PICA): Patent origins from the vertebral arteries. --Anterior inferior cerebellar arteries (AICA): Not clearly visualized, though this is not uncommon. --Basilar artery: Normal. --Superior cerebellar arteries: Normal. --Posterior cerebral arteries (PCA): Normal. There are bilateral posterior communicating arteries (p-comm) that partially supply the PCAs. ANTERIOR CIRCULATION: --Intracranial internal carotid arteries: Normal. --Anterior cerebral arteries (ACA): Normal. Both A1 segments are present. Patent anterior communicating artery (a-comm). --Middle cerebral arteries (MCA): Normal. MRA NECK FINDINGS There is no occlusion or stenosis of the vertebral or carotid arteries. IMPRESSION: 1. Small vessel infarct of the dorsolateral left thalamus, in close proximity to the posterior limb of the left internal capsule, in keeping with reported right-sided symptoms. 2. Normal intracranial and cervical MRA. 3. No intracranial hemorrhage or mass effect. Electronically Signed   By: Ulyses Jarred M.D.   On: 06/08/2019 23:07    PHYSICAL EXAM Pleasant frail elderly Caucasian lady not in distress. . Afebrile. Head is nontraumatic. Neck is supple without bruit.    Cardiac exam no murmur or gallop. Lungs are clear to auscultation. Distal pulses are well felt. Neurological Exam ;  Awake  Alert oriented x 3. Normal speech and language.eye movements full without nystagmus.fundi were not visualized. Vision acuity and fields appear normal. Hearing is normal. Palatal movements are normal. Face symmetric. Tongue midline. Normal strength, tone, reflexes and coordination.  Diminished right hemibody sensation for touch and pinprick. Gait deferred.  ASSESSMENT/PLAN [name] is a 73  y.o.  female with history of osteoporosis, hypertension, hyperlipidemia and fibromyalgia presenting with decreased sensation on right face, arm, leg.   Stroke:   L thalamic infarct secondary to small vessel disease source  CT head chronic WMD. No acute abnormality.   MRI  Dorsolateral L thalamic infarct   MRA head  normal  MRA neck Normal  2D Echo normal ejection fraction.  No cardiac source of embolism.  LDL 77  HgbA1c 6.1  Lovenox 40 mg sq daily for VTE prophylaxis  No antithrombotic prior to admission, now on aspirin 325 mg daily.    Therapy recommendations: No PT follow-up disposition: Home  Hypertension  Stable . Permissive hypertension (OK if < 220/120) but gradually normalize in 5-7 days . Long-term BP goal normotensive  Hyperlipidemia  Home meds:  lipitor 20  Now on lipitor 80  LDL 77, goal < 70  Continue statin at discharge  Other Stroke Risk Factors  Advanced age  ETOH use, advised to drink no more than 1 drink(s) a day  Overweight, Body mass index is 27.52 kg/m., recommend weight loss, diet and exercise as appropriate   Other Active Problems  Fibromyalgia  Osteoporosis   Hospital day # 0  I have personally obtained history,examined this patient, reviewed notes, independently viewed imaging studies, participated in medical decision making and plan of care.ROS completed by me personally and pertinent positives fully documented  I have made any additions or clarifications directly to the above note.  She presented with sudden onset of right body paresthesias secondary to small left thalamic infarct from small vessel disease.  Recommend aspirin and Plavix for 3 weeks followed by aspirin alone.  Aggressive risk factor modification.  Likely discharge home later today.  Greater than 50% time during this 25-minute visit was spent on counseling and coordination of care about her lacunar stroke and answering questions.  Follow-up as an outpatient stroke clinic in 6  weeks.  Antony Contras, MD Medical Director [facility] Pager: [phone] 06/09/2019 3:17 PM   To contact Stroke Continuity provider, please refer to [email or web address] After hours, contact General Neurology

## 2019-06-09 NOTE — Telephone Encounter (Signed)
Patient called and states that lips, right arm, rt foot, and leg is numb.  Patient advised to go to the ER. MPulliam, CMA/RT(R)

## 2019-06-09 NOTE — Evaluation (Signed)
Occupational Therapy Evaluation Patient Details Name: Pam Scott MRN: [id number] DOB: [birth year] Today's Date: 06/09/2019    History of Present Illness Pt is a 73 yo female s/p R side numbness and MRI revelead small vessel infarct L thalamus. PMHx: osteo, HTN, HLD, fibromyalgia   Clinical Impression   Pt PTA: living independently with spouse. Pt currently performing ADL functional mobility and transfers with modified independence. Pt performing own grooming and self care without assist and with good balance. Numb sensation on R side is on ly focal deficit. No further skilled OT services required. OT signing off.    Follow Up Recommendations  No OT follow up    Equipment Recommendations  None recommended by OT    Recommendations for Other Services       Precautions / Restrictions Precautions Precautions: Fall Restrictions Weight Bearing Restrictions: No      Mobility Bed Mobility Overal bed mobility: Modified Independent             General bed mobility comments: no physical assist  Transfers Overall transfer level: Modified independent   Transfers: Sit to/from Stand Sit to Stand: Modified independent (Device/Increase time)              Balance Overall balance assessment: Modified Independent                                         ADL either performed or assessed with clinical judgement   ADL Overall ADL's : At baseline                                       General ADL Comments: Pt independent with ADL and mobility with no AD.     Vision   Vision Assessment?: Yes Eye Alignment: Within Functional Limits Ocular Range of Motion: Within Functional Limits Alignment/Gaze Preference: Within Defined Limits Tracking/Visual Pursuits: Able to track stimulus in all quads without difficulty Saccades: Within functional limits Convergence: Within functional limits     Perception     Praxis      Pertinent  Vitals/Pain Pain Assessment: No/denies pain     Hand Dominance Right   Extremity/Trunk Assessment Upper Extremity Assessment Upper Extremity Assessment: Overall WFL for tasks assessed   Lower Extremity Assessment Lower Extremity Assessment: Overall WFL for tasks assessed       Communication Communication Communication: No difficulties   Cognition Arousal/Alertness: Awake/alert Behavior During Therapy: WFL for tasks assessed/performed Overall Cognitive Status: Within Functional Limits for tasks assessed                                     General Comments  Pt with sensation deficits, but no other focal deficits    Exercises     Shoulder Instructions      Home Living Family/patient expects to be discharged to:: Private residence Living Arrangements: Alone Available Help at Discharge: Family;Available 24 hours/day(spouse works from home) Type of Home: House Home Access: Stairs to enter [employer] of Steps: 7 Entrance Stairs-Rails: Can reach both Home Layout: Multi-level;Able to live on main level with bedroom/bathroom Alternate Level Stairs-Number of Steps: flight   Bathroom Shower/Tub: Teacher, early years/pre: Handicapped height     Home Equipment: None  Prior Functioning/Environment Level of Independence: Independent                 OT Problem List:        OT Treatment/Interventions:      OT Goals(Current goals can be found in the care plan section)    OT Frequency:     Barriers to D/C:            Co-evaluation              AM-PAC OT "6 Clicks" Daily Activity     Outcome Measure Help from another person eating meals?: None Help from another person taking care of personal grooming?: None Help from another person toileting, which includes using toliet, bedpan, or urinal?: None Help from another person bathing (including washing, rinsing, drying)?: None Help from another person to put on  and taking off regular upper body clothing?: None Help from another person to put on and taking off regular lower body clothing?: None 6 Click Score: 24   End of Session Equipment Utilized During Treatment: Gait belt Nurse Communication: Mobility status  Activity Tolerance: Patient tolerated treatment well Patient left: in chair;with call bell/phone within reach  OT Visit Diagnosis: Unsteadiness on feet (R26.81)                Time: 1005-1025 OT Time Calculation (min): 20 min Charges:  OT General Charges $OT Visit: 1 Visit OT Evaluation $OT Eval Moderate Complexity: 1 Mod  Darryl Nestle) Marsa Aris OTR/L Acute Rehabilitation Services Pager: [phone] Office: [place] 06/09/2019, 10:39 AM

## 2019-06-09 NOTE — Evaluation (Signed)
Speech Language Pathology Evaluation Patient Details Name: Pam Scott MRN: [id number] DOB: [birth year] Today's Date: 06/09/2019 Time: 1040-1105 SLP Time Calculation (min) (ACUTE ONLY): 25 min  Problem List:  Patient Active Problem List   Diagnosis Date Noted  . Numbness and tingling of right side of face 06/08/2019  . TIA (transient ischemic attack) 06/08/2019  . Prediabetes- newly Dx Feb 2020 12/29/2018  . History of adjustment disorder 10/12/2018  . Vitamin D deficiency 10/12/2018  . Chronic foot pain, right-and ankle; sees Dr. Paulla Dolly of podiatry  10/12/2018  . Neuropathic pain-due to fibromyalgia, sees Dr. Lenna Gilford of rheumatology 10/12/2018  . Long-term current use of opiate analgesic 04/13/2018  . Allergic contact dermatitis due to plants, except food 03/15/2018  . Low back pain at multiple sites 01/21/2018  . Estrogen deficiency 09/16/2016  . Primary osteoarthritis of right shoulder 04/17/2015  . Nonspecific abnormal electrocardiogram (ECG) (EKG) 06/28/2014  . Middle insomnia 06/28/2014  . Cervical radiculitis 07/06/2013  . Bradycardia 06/23/2013  . Fibromyalgia muscle pain 07/28/2012  . Routine general medical examination at a health care facility 04/19/2012  . Allergic rhinitis due to other allergen 03/20/2010  . Hyperlipidemia with target LDL less than 130 11/30/2009  . DEPRESSIVE DISORDER 11/30/2009  . Essential hypertension, benign 11/30/2009  . Osteoporosis 11/30/2009   Past Medical History:  Past Medical History:  Diagnosis Date  . Fibromyalgia   . Hyperlipidemia   . Hypertension   . Osteoporosis    Past Surgical History:  Past Surgical History:  Procedure Laterality Date  . ABDOMINAL HYSTERECTOMY    . CATARACT EXTRACTION    . CYSTOCELE REPAIR    . ROTATOR CUFF REPAIR     HPI:  Pam Scott, 73y/f, presented to ED with right sided numbness. MRI revealed small vessel infarct L thalamus. PMH: osteo, HTN, HLD, fibromyalgia.    Assessment / Plan /  Recommendation Clinical Impression  Patient presents today with no cognitve/language deficits. She reports no change in her speech. She does continue to have right sided face numbness and a slight right sided mouth droop. No impact on her speech or eating. MOCA was administered with a score of 29/30 and communicating at the conversational level. Patient has no needs for speech therapy.     SLP Assessment  SLP Recommendation/Assessment: Patient does not need any further Speech Lanaguage Pathology Services    Follow Up Recommendations  None               SLP Evaluation Cognition  Overall Cognitive Status: Within Functional Limits for tasks assessed Arousal/Alertness: Awake/alert Orientation Level: Oriented X4 Attention: Divided;Focused;Sustained;Selective;Alternating Focused Attention: Appears intact Sustained Attention: Appears intact Selective Attention: Appears intact Alternating Attention: Appears intact Divided Attention: Appears intact Memory: Appears intact Awareness: Appears intact Problem Solving: Appears intact Executive Function: Reasoning Reasoning: Appears intact Safety/Judgment: Appears intact       Comprehension  Auditory Comprehension Overall Auditory Comprehension: Appears within functional limits for tasks assessed Yes/No Questions: Within Functional Limits Commands: Within Functional Limits Conversation: Complex Visual Recognition/Discrimination Discrimination: Within Function Limits Reading Comprehension Reading Status: Within funtional limits    Expression Expression Primary Mode of Expression: Verbal Verbal Expression Overall Verbal Expression: Appears within functional limits for tasks assessed Initiation: No impairment Level of Generative/Spontaneous Verbalization: Conversation Repetition: No impairment Naming: No impairment Pragmatics: No impairment Written Expression Dominant Hand: Right Written Expression: Within Functional Limits    Oral / Motor  Motor Speech Overall Motor Speech: Appears within functional limits for tasks assessed Respiration: Within functional limits Phonation:  Normal Resonance: Within functional limits Articulation: Within functional limitis Intelligibility: Intelligible Motor Planning: Witnin functional limits Motor Speech Errors: Not applicable   [place], MA, CCC-SLP 06/09/2019 11:34 AM

## 2019-06-09 NOTE — Plan of Care (Signed)
Progressing

## 2019-06-09 NOTE — Plan of Care (Signed)
Progressing towards goals

## 2019-06-09 NOTE — Discharge Instructions (Signed)
Pam Scott,  You were in the hospital with a stroke. Your medications have been adjusted. Please follow-up with the stroke doctor as an outpatient.

## 2019-06-09 NOTE — Discharge Summary (Signed)
Physician Discharge Summary  Pam Scott BCW:[id number] DOB: 1946/04/01 DOA: 06/08/2019  PCP: [name], DO  Admit date: 06/08/2019 Discharge date: 06/09/2019  Admitted From: Home Disposition: Home  Recommendations for Outpatient Follow-up:  1. Follow up with PCP in 1 week 2. Follow up with neurology/stroke as an outpatient in 4 weeks 3. Please obtain BMP/CBC in one week 4. Please follow up on the following pending results: None  Home Health: None Equipment/Devices: None  Discharge Condition: Stable CODE STATUS: Full code Diet recommendation: Heart healthy   Brief/Interim Summary:  Admission HPI written by Pam Ford, DO   Chief Complaint: Right sided numbness  HPI: Pam Scott is a 73 y.o. female with a medical history of hypertension, hyperlipidemia, fibromyalgia, who presented to the emergency department at med center [facility] with complaints of right-sided facial numbness as well as right arm and right leg.  Symptoms started yesterday 06/07/2019 at approximate noon with numbness around the lips.  As the night progressed and early today, symptoms worsen to include right side of the face (eye down), right arm and right leg.  Patient does state that she got bit by a bug a few days ago and has been scratching that area on her leg.  She denies any weakness but does feel that her gait is unsteady given her numbness.  Patient denies any recent illness or sickness.  She did recently travel to [place] to help take care of her son's children.  She denies any current chest pain, shortness of breath, abdominal pain, nausea or vomiting, diarrhea or constipation, dizziness or headache, problems with urination.    Hospital course:  Left thalamic infarct Small vessel mediated. Normal MRA of head/neck. Transthoracic Echocardiogram without embolic source. LDL of 77 and hemoglobin A1C of 6.1. Lipitor increased to 40 mg daily. Patient given aspirin 325 mg but discharged  on aspirin and 3 weeks of Plavix.   Essential hypertension Permissive hypertension. Resume home medications in 3 days.  Hyperlipidemia Increased to lipitor 40 mg daily  Fibromyalgia Continue Lyrica  Osteoperosis Continue vitamin D   Discharge Diagnoses:  Principal Problem:   Left thalamic infarction [facility]) Active Problems:   Hyperlipidemia with target LDL less than 130   Essential hypertension, benign   Osteoporosis   Fibromyalgia muscle pain   Numbness and tingling of right side of face    Discharge Instructions   Allergies as of 06/09/2019      Reactions   Amlodipine    edema   Simvastatin    myalgias   Erythromycin       Medication List    STOP taking these medications   meloxicam 15 MG tablet Commonly known as: MOBIC     TAKE these medications   aspirin 81 MG EC tablet Take 1 tablet (81 mg total) by mouth daily. Start taking on: June 10, 2019   atorvastatin 40 MG tablet Commonly known as: LIPITOR Take 1 tablet (40 mg total) by mouth daily at 6 PM. What changed:   medication strength  how much to take  when to take this   bimatoprost 0.03 % ophthalmic solution Commonly known as: LUMIGAN Place 1 drop into both eyes at bedtime.   clopidogrel 75 MG tablet Commonly known as: PLAVIX Take 1 tablet (75 mg total) by mouth daily.   hydrochlorothiazide 12.5 MG capsule Commonly known as: MICROZIDE Take 1 capsule (12.5 mg total) by mouth daily. Start taking on: June 12, 2019 What changed: These instructions start on June 12, 2019.  If you are unsure what to do until then, ask your doctor or other care provider.   olmesartan 20 MG tablet Commonly known as: BENICAR Take 1 tablet (20 mg total) by mouth daily. Start taking on: June 12, 2019 What changed: These instructions start on June 12, 2019. If you are unsure what to do until then, ask your doctor or other care provider.   pregabalin 75 MG capsule Commonly known as: Lyrica Take 1 capsule (75 mg  total) by mouth 2 (two) times daily. What changed: when to take this   Vitamin D3 50 MCG (2000 UT) Tabs Take 1 tablet by mouth daily.       Allergies  Allergen Reactions   Amlodipine     edema   Simvastatin     myalgias   Erythromycin     Consultations:  Neurology   Procedures/Studies: Dg Chest 2 View  Result Date: 06/08/2019 CLINICAL DATA:  Right-sided numbness for 2 days EXAM: CHEST - 2 VIEW COMPARISON:  None. FINDINGS: The heart size and mediastinal contours are within normal limits. Both lungs are clear. The visualized skeletal structures are unremarkable. IMPRESSION: No active cardiopulmonary disease. Electronically Signed   By: Inez Catalina M.D.   On: 06/08/2019 18:02   Ct Head Wo Contrast  Result Date: 06/08/2019 CLINICAL DATA:  Right facial and extremity numbness. EXAM: CT HEAD WITHOUT CONTRAST TECHNIQUE: Contiguous axial images were obtained from the base of the skull through the vertex without intravenous contrast. COMPARISON:  None. FINDINGS: Brain: Minimal chronic ischemic white matter disease is noted. No mass effect or midline shift is noted. Ventricular size is within normal limits. There is no evidence of mass lesion, hemorrhage or acute infarction. Vascular: No hyperdense vessel or unexpected calcification. Skull: Normal. Negative for fracture or focal lesion. Sinuses/Orbits: No acute finding. Other: None. IMPRESSION: Minimal chronic ischemic white matter disease. No acute intracranial abnormality seen. Electronically Signed   By: Marijo Conception M.D.   On: 06/08/2019 11:09   Mr Angio Head Wo Contrast  Result Date: 06/08/2019 CLINICAL DATA:  Paresthesias with numbness and tingling. Right facial numbness EXAM: MR HEAD WITHOUT CONTRAST MR CIRCLE OF WILLIS WITHOUT CONTRAST MRA OF THE NECK WITHOUT AND WITH CONTRAST TECHNIQUE: Multiplanar, multiecho pulse sequences of the brain, circle of willis and surrounding structures were obtained without intravenous contrast.  Angiographic images of the neck were obtained using MRA technique without and with intravenous contrast. CONTRAST:  7 mL Gadavist COMPARISON:  None. FINDINGS: MR HEAD FINDINGS BRAIN: There is a small focus of abnormal diffusion restriction at the dorsolateral left thalamus, adjacent to the posterior limb of the internal capsule. No other diffusion abnormality. Multifocal white matter hyperintensity, most commonly due to chronic ischemic microangiopathy. The cerebral and cerebellar volume are age-appropriate. There is no hydrocephalus. Susceptibility-sensitive sequences show no chronic microhemorrhage or superficial siderosis. The midline structures are normal. There is no midline shift or mass effect. VASCULAR: The major intracranial arterial and venous sinus flow voids are normal. SKULL AND UPPER CERVICAL SPINE: Calvarial bone marrow signal is normal. There is no skull base mass. The visualized upper cervical spine and soft tissues are normal. SINUSES/ORBITS: There are no fluid levels or advanced mucosal thickening. The mastoid air cells and middle ear cavities are free of fluid. The orbits are normal. MR CIRCLE OF WILLIS FINDINGS POSTERIOR CIRCULATION: --Vertebral arteries: Normal V4 segments. --Posterior inferior cerebellar arteries (PICA): Patent origins from the vertebral arteries. --Anterior inferior cerebellar arteries (AICA): Not clearly visualized, though this is not  uncommon. --Basilar artery: Normal. --Superior cerebellar arteries: Normal. --Posterior cerebral arteries (PCA): Normal. There are bilateral posterior communicating arteries (p-comm) that partially supply the PCAs. ANTERIOR CIRCULATION: --Intracranial internal carotid arteries: Normal. --Anterior cerebral arteries (ACA): Normal. Both A1 segments are present. Patent anterior communicating artery (a-comm). --Middle cerebral arteries (MCA): Normal. MRA NECK FINDINGS There is no occlusion or stenosis of the vertebral or carotid arteries.  IMPRESSION: 1. Small vessel infarct of the dorsolateral left thalamus, in close proximity to the posterior limb of the left internal capsule, in keeping with reported right-sided symptoms. 2. Normal intracranial and cervical MRA. 3. No intracranial hemorrhage or mass effect. Electronically Signed   By: Ulyses Jarred M.D.   On: 06/08/2019 23:07   Mr Angio Neck W Wo Contrast  Result Date: 06/08/2019 CLINICAL DATA:  Paresthesias with numbness and tingling. Right facial numbness EXAM: MR HEAD WITHOUT CONTRAST MR CIRCLE OF WILLIS WITHOUT CONTRAST MRA OF THE NECK WITHOUT AND WITH CONTRAST TECHNIQUE: Multiplanar, multiecho pulse sequences of the brain, circle of willis and surrounding structures were obtained without intravenous contrast. Angiographic images of the neck were obtained using MRA technique without and with intravenous contrast. CONTRAST:  7 mL Gadavist COMPARISON:  None. FINDINGS: MR HEAD FINDINGS BRAIN: There is a small focus of abnormal diffusion restriction at the dorsolateral left thalamus, adjacent to the posterior limb of the internal capsule. No other diffusion abnormality. Multifocal white matter hyperintensity, most commonly due to chronic ischemic microangiopathy. The cerebral and cerebellar volume are age-appropriate. There is no hydrocephalus. Susceptibility-sensitive sequences show no chronic microhemorrhage or superficial siderosis. The midline structures are normal. There is no midline shift or mass effect. VASCULAR: The major intracranial arterial and venous sinus flow voids are normal. SKULL AND UPPER CERVICAL SPINE: Calvarial bone marrow signal is normal. There is no skull base mass. The visualized upper cervical spine and soft tissues are normal. SINUSES/ORBITS: There are no fluid levels or advanced mucosal thickening. The mastoid air cells and middle ear cavities are free of fluid. The orbits are normal. MR CIRCLE OF WILLIS FINDINGS POSTERIOR CIRCULATION: --Vertebral arteries: Normal  V4 segments. --Posterior inferior cerebellar arteries (PICA): Patent origins from the vertebral arteries. --Anterior inferior cerebellar arteries (AICA): Not clearly visualized, though this is not uncommon. --Basilar artery: Normal. --Superior cerebellar arteries: Normal. --Posterior cerebral arteries (PCA): Normal. There are bilateral posterior communicating arteries (p-comm) that partially supply the PCAs. ANTERIOR CIRCULATION: --Intracranial internal carotid arteries: Normal. --Anterior cerebral arteries (ACA): Normal. Both A1 segments are present. Patent anterior communicating artery (a-comm). --Middle cerebral arteries (MCA): Normal. MRA NECK FINDINGS There is no occlusion or stenosis of the vertebral or carotid arteries. IMPRESSION: 1. Small vessel infarct of the dorsolateral left thalamus, in close proximity to the posterior limb of the left internal capsule, in keeping with reported right-sided symptoms. 2. Normal intracranial and cervical MRA. 3. No intracranial hemorrhage or mass effect. Electronically Signed   By: Ulyses Jarred M.D.   On: 06/08/2019 23:07   Mr Brain Wo Contrast  Result Date: 06/08/2019 CLINICAL DATA:  Paresthesias with numbness and tingling. Right facial numbness EXAM: MR HEAD WITHOUT CONTRAST MR CIRCLE OF WILLIS WITHOUT CONTRAST MRA OF THE NECK WITHOUT AND WITH CONTRAST TECHNIQUE: Multiplanar, multiecho pulse sequences of the brain, circle of willis and surrounding structures were obtained without intravenous contrast. Angiographic images of the neck were obtained using MRA technique without and with intravenous contrast. CONTRAST:  7 mL Gadavist COMPARISON:  None. FINDINGS: MR HEAD FINDINGS BRAIN: There is a small focus of abnormal  diffusion restriction at the dorsolateral left thalamus, adjacent to the posterior limb of the internal capsule. No other diffusion abnormality. Multifocal white matter hyperintensity, most commonly due to chronic ischemic microangiopathy. The cerebral  and cerebellar volume are age-appropriate. There is no hydrocephalus. Susceptibility-sensitive sequences show no chronic microhemorrhage or superficial siderosis. The midline structures are normal. There is no midline shift or mass effect. VASCULAR: The major intracranial arterial and venous sinus flow voids are normal. SKULL AND UPPER CERVICAL SPINE: Calvarial bone marrow signal is normal. There is no skull base mass. The visualized upper cervical spine and soft tissues are normal. SINUSES/ORBITS: There are no fluid levels or advanced mucosal thickening. The mastoid air cells and middle ear cavities are free of fluid. The orbits are normal. MR CIRCLE OF WILLIS FINDINGS POSTERIOR CIRCULATION: --Vertebral arteries: Normal V4 segments. --Posterior inferior cerebellar arteries (PICA): Patent origins from the vertebral arteries. --Anterior inferior cerebellar arteries (AICA): Not clearly visualized, though this is not uncommon. --Basilar artery: Normal. --Superior cerebellar arteries: Normal. --Posterior cerebral arteries (PCA): Normal. There are bilateral posterior communicating arteries (p-comm) that partially supply the PCAs. ANTERIOR CIRCULATION: --Intracranial internal carotid arteries: Normal. --Anterior cerebral arteries (ACA): Normal. Both A1 segments are present. Patent anterior communicating artery (a-comm). --Middle cerebral arteries (MCA): Normal. MRA NECK FINDINGS There is no occlusion or stenosis of the vertebral or carotid arteries. IMPRESSION: 1. Small vessel infarct of the dorsolateral left thalamus, in close proximity to the posterior limb of the left internal capsule, in keeping with reported right-sided symptoms. 2. Normal intracranial and cervical MRA. 3. No intracranial hemorrhage or mass effect. Electronically Signed   By: Ulyses Jarred M.D.   On: 06/08/2019 23:07     Transthoracic Echocardiogram (06/09/2019) IMPRESSIONS    1. The left ventricle has normal systolic function, with an  ejection fraction of 55-60%. The cavity size was normal. There is mild concentric left ventricular hypertrophy. Left ventricular diastolic Doppler parameters are consistent with impaired  relaxation. No evidence of left ventricular regional wall motion abnormalities.  2. The right ventricle has normal systolic function. The cavity was normal. There is no increase in right ventricular wall thickness. Right ventricular systolic pressure could not be assessed.  3. Left atrial size was mildly dilated.  4. The aortic valve was not well visualized.  5. The aorta is normal in size and structure.   Subjective: Still with some numbness of left face, arm and leg  Discharge Exam: Vitals:   06/09/19 0340 06/09/19 0755  BP: 127/71 (!) 141/89  Pulse: 71 69  Resp: 13 16  Temp: 98 F (36.7 C) 98.2 F (36.8 C)  SpO2: 96% 95%   Vitals:   06/08/19 2220 06/08/19 2328 06/09/19 0340 06/09/19 0755  BP: 117/83 (!) 146/87 127/71 (!) 141/89  Pulse: 74 75 71 69  Resp: 16 15 13 16   Temp: 98.5 F (36.9 C) (!) 97.4 F (36.3 C) 98 F (36.7 C) 98.2 F (36.8 C)  TempSrc: Axillary Oral Oral Oral  SpO2: 95% 97% 96% 95%  Weight:      Height:        General: Pt is alert, awake, not in acute distress Cardiovascular: RRR, S1/S2 +, no rubs, no gallops Respiratory: CTA bilaterally, no wheezing, no rhonchi Abdominal: Soft, NT, ND, bowel sounds + Extremities: no edema, no cyanosis Neuro: normal strength. Diminished sensation on right side.    The results of significant diagnostics from this hospitalization (including imaging, microbiology, ancillary and laboratory) are listed below for reference.  Microbiology: Recent Results (from the past 240 hour(s))  SARS Coronavirus 2 (Performed in [place] hospital lab)     Status: None   Collection Time: 06/08/19 11:54 AM   Specimen: Nasopharyngeal Swab  Result Value Ref Range Status   SARS Coronavirus 2 NEGATIVE NEGATIVE Final    Comment: (NOTE) If  result is NEGATIVE SARS-CoV-2 target nucleic acids are NOT DETECTED. The SARS-CoV-2 RNA is generally detectable in upper and lower  respiratory specimens during the acute phase of infection. The lowest  concentration of SARS-CoV-2 viral copies this assay can detect is 250  copies / mL. A negative result does not preclude SARS-CoV-2 infection  and should not be used as the sole basis for treatment or other  patient management decisions.  A negative result may occur with  improper specimen collection / handling, submission of specimen other  than nasopharyngeal swab, presence of viral mutation(s) within the  areas targeted by this assay, and inadequate number of viral copies  (<250 copies / mL). A negative result must be combined with clinical  observations, patient history, and epidemiological information. If result is POSITIVE SARS-CoV-2 target nucleic acids are DETECTED. The SARS-CoV-2 RNA is generally detectable in upper and lower  respiratory specimens dur ing the acute phase of infection.  Positive  results are indicative of active infection with SARS-CoV-2.  Clinical  correlation with patient history and other diagnostic information is  necessary to determine patient infection status.  Positive results do  not rule out bacterial infection or co-infection with other viruses. If result is PRESUMPTIVE POSTIVE SARS-CoV-2 nucleic acids MAY BE PRESENT.   A presumptive positive result was obtained on the submitted specimen  and confirmed on repeat testing.  While 2019 novel coronavirus  (SARS-CoV-2) nucleic acids may be present in the submitted sample  additional confirmatory testing may be necessary for epidemiological  and / or clinical management purposes  to differentiate between  SARS-CoV-2 and other Sarbecovirus currently known to infect humans.  If clinically indicated additional testing with an alternate test  methodology [phone]) is advised. The SARS-CoV-2 RNA is generally    detectable in upper and lower respiratory sp ecimens during the acute  phase of infection. The expected result is Negative. Fact Sheet for Patients:  StrictlyIdeas.no Fact Sheet for Healthcare Providers: BankingDealers.co.za This test is not yet approved or cleared by the [place] FDA and has been authorized for detection and/or diagnosis of SARS-CoV-2 by FDA under an Emergency Use Authorization (EUA).  This EUA will remain in effect (meaning this test can be used) for the duration of the COVID-19 declaration under Section 564(b)(1) of the Act, 21 U.S.C. section 360bbb-3(b)(1), unless the authorization is terminated or revoked sooner. Performed at [facility], [address] [place], [place] [address]      Labs: BNP (last 3 results) No results for input(s): BNP in the last 8760 hours. Basic Metabolic Panel: Recent Labs  Lab 06/08/19 1024 06/08/19 1653  NA 140  --   K 4.1  --   CL 103  --   CO2 28  --   GLUCOSE 98  --   BUN 18  --   CREATININE 1.00 0.95  CALCIUM 9.5  --    Liver Function Tests: Recent Labs  Lab 06/08/19 1024  AST 24  ALT 30  ALKPHOS 56  BILITOT 0.7  PROT 7.3  ALBUMIN 4.2   No results for input(s): LIPASE, AMYLASE in the last 168 hours. No results for input(s):  AMMONIA in the last 168 hours. CBC: Recent Labs  Lab 06/08/19 1024 06/08/19 1653  WBC 6.4 7.2  HGB 12.9 13.0  HCT 40.3 39.1  MCV 93.1 91.1  PLT 205 202   Cardiac Enzymes: No results for input(s): CKTOTAL, CKMB, CKMBINDEX, TROPONINI in the last 168 hours. BNP: Invalid input(s): POCBNP CBG: No results for input(s): GLUCAP in the last 168 hours. D-Dimer No results for input(s): DDIMER in the last 72 hours. Hgb A1c Recent Labs    06/09/19 0335  HGBA1C 6.1*   Lipid Profile Recent Labs    06/09/19 0335  CHOL 164  HDL 64  LDLCALC 77  TRIG 115  CHOLHDL 2.6   Thyroid function studies No results for  input(s): TSH, T4TOTAL, T3FREE, THYROIDAB in the last 72 hours.  Invalid input(s): FREET3 Anemia work up No results for input(s): VITAMINB12, FOLATE, FERRITIN, TIBC, IRON, RETICCTPCT in the last 72 hours. Urinalysis    Component Value Date/Time   COLORURINE LT. YELLOW 06/23/2013 [place] 06/23/2013 0952   LABSPEC 1.010 06/23/2013 0952   PHURINE 6.0 06/23/2013 0952   GLUCOSEU NEGATIVE 06/23/2013 0952   HGBUR NEGATIVE 06/23/2013 0952   BILIRUBINUR NEGATIVE 06/23/2013 0952   KETONESUR NEGATIVE 06/23/2013 0952   UROBILINOGEN 0.2 06/23/2013 0952   NITRITE NEGATIVE 06/23/2013 0952   LEUKOCYTESUR NEGATIVE 06/23/2013 0952   Sepsis Labs Invalid input(s): PROCALCITONIN,  WBC,  LACTICIDVEN Microbiology Recent Results (from the past 240 hour(s))  SARS Coronavirus 2 (Performed in [place] hospital lab)     Status: None   Collection Time: 06/08/19 11:54 AM   Specimen: Nasopharyngeal Swab  Result Value Ref Range Status   SARS Coronavirus 2 NEGATIVE NEGATIVE Final    Comment: (NOTE) If result is NEGATIVE SARS-CoV-2 target nucleic acids are NOT DETECTED. The SARS-CoV-2 RNA is generally detectable in upper and lower  respiratory specimens during the acute phase of infection. The lowest  concentration of SARS-CoV-2 viral copies this assay can detect is 250  copies / mL. A negative result does not preclude SARS-CoV-2 infection  and should not be used as the sole basis for treatment or other  patient management decisions.  A negative result may occur with  improper specimen collection / handling, submission of specimen other  than nasopharyngeal swab, presence of viral mutation(s) within the  areas targeted by this assay, and inadequate number of viral copies  (<250 copies / mL). A negative result must be combined with clinical  observations, patient history, and epidemiological information. If result is POSITIVE SARS-CoV-2 target nucleic acids are DETECTED. The SARS-CoV-2  RNA is generally detectable in upper and lower  respiratory specimens dur ing the acute phase of infection.  Positive  results are indicative of active infection with SARS-CoV-2.  Clinical  correlation with patient history and other diagnostic information is  necessary to determine patient infection status.  Positive results do  not rule out bacterial infection or co-infection with other viruses. If result is PRESUMPTIVE POSTIVE SARS-CoV-2 nucleic acids MAY BE PRESENT.   A presumptive positive result was obtained on the submitted specimen  and confirmed on repeat testing.  While 2019 novel coronavirus  (SARS-CoV-2) nucleic acids may be present in the submitted sample  additional confirmatory testing may be necessary for epidemiological  and / or clinical management purposes  to differentiate between  SARS-CoV-2 and other Sarbecovirus currently known to infect humans.  If clinically indicated additional testing with an alternate test  methodology [phone]) is advised. The SARS-CoV-2 RNA is  generally  detectable in upper and lower respiratory sp ecimens during the acute  phase of infection. The expected result is Negative. Fact Sheet for Patients:  StrictlyIdeas.no Fact Sheet for Healthcare Providers: BankingDealers.co.za This test is not yet approved or cleared by the [place] FDA and has been authorized for detection and/or diagnosis of SARS-CoV-2 by FDA under an Emergency Use Authorization (EUA).  This EUA will remain in effect (meaning this test can be used) for the duration of the COVID-19 declaration under Section 564(b)(1) of the Act, 21 U.S.C. section 360bbb-3(b)(1), unless the authorization is terminated or revoked sooner. Performed at [facility], [address], [place], [place] [address]      SIGNED:   Cordelia Poche, MD [facility] 06/09/2019, 2:16 PM

## 2019-06-09 NOTE — Progress Notes (Signed)
  Echocardiogram 2D Echocardiogram has been performed.  Marybelle Killings 06/09/2019, 9:12 AM

## 2019-06-09 NOTE — Evaluation (Signed)
Physical Therapy Evaluation Patient Details Name: Pam Scott MRN: [id number] DOB: 1945-12-27 Today's Date: 06/09/2019   History of Present Illness  Pt is a 73 yo female s/p R side numbness and MRI revelead small vessel infarct L thalamus. PMHx: osteo, HTN, HLD, fibromyalgia    Clinical Impression  Patient admitted with the above. Patient reports independence at baseline for mobility and ADLs. Patient today ambulating in hallway with good safety, stability, balance and appropriate obstacle navigation. Patient without LOB or need for physical assist. No further acute PT needs identified. PT to sign off.      Follow Up Recommendations No PT follow up    Equipment Recommendations  None recommended by PT    Recommendations for Other Services       Precautions / Restrictions Precautions Precautions: Fall Restrictions Weight Bearing Restrictions: No      Mobility  Bed Mobility Overal bed mobility: Modified Independent             General bed mobility comments: up in recliner  Transfers Overall transfer level: Modified independent   Transfers: Sit to/from Stand Sit to Stand: Modified independent (Device/Increase time)            Ambulation/Gait Ambulation/Gait assistance: Supervision Gait Distance (Feet): 300 Feet Assistive device: None Gait Pattern/deviations: Step-through pattern Gait velocity: WNL   General Gait Details: steady pace of gait - no LOB  Stairs            Wheelchair Mobility    Modified Rankin (Stroke Patients Only)       Balance Overall balance assessment: Modified Independent                               Standardized Balance Assessment Standardized Balance Assessment : Dynamic Gait Index   Dynamic Gait Index Level Surface: Normal Change in Gait Speed: Normal Gait with Horizontal Head Turns: Normal Gait with Vertical Head Turns: Normal Gait and Pivot Turn: Mild Impairment Step Over Obstacle:  Normal Step Around Obstacles: Normal Steps: Mild Impairment Total Score: 22       Pertinent Vitals/Pain Pain Assessment: No/denies pain    Home Living Family/patient expects to be discharged to:: Private residence Living Arrangements: Alone Available Help at Discharge: Family;Available 24 hours/day(spouse works from home) Type of Home: House Home Access: Stairs to enter Entrance Stairs-Rails: Can reach both Entrance Stairs-Number of Steps: 7 Home Layout: Multi-level;Able to live on main level with bedroom/bathroom Home Equipment: None      Prior Function Level of Independence: Independent               Hand Dominance   Dominant Hand: Right    Extremity/Trunk Assessment   Upper Extremity Assessment Upper Extremity Assessment: Overall WFL for tasks assessed    Lower Extremity Assessment Lower Extremity Assessment: Overall WFL for tasks assessed    Cervical / Trunk Assessment Cervical / Trunk Assessment: Normal  Communication   Communication: No difficulties  Cognition Arousal/Alertness: Awake/alert Behavior During Therapy: WFL for tasks assessed/performed Overall Cognitive Status: Within Functional Limits for tasks assessed                                        General Comments General comments (skin integrity, edema, etc.): only reports mild sensory deficits at R UE/LE    Exercises     Assessment/Plan    PT  Assessment Patent does not need any further PT services  PT Problem List         PT Treatment Interventions      PT Goals (Current goals can be found in the Care Plan section)  Acute Rehab PT Goals Patient Stated Goal: return home today PT Goal Formulation: All assessment and education complete, DC therapy    Frequency     Barriers to discharge        Co-evaluation               AM-PAC PT "6 Clicks" Mobility  Outcome Measure Help needed turning from your back to your side while in a flat bed without using  bedrails?: None Help needed moving from lying on your back to sitting on the side of a flat bed without using bedrails?: None Help needed moving to and from a bed to a chair (including a wheelchair)?: A Little Help needed standing up from a chair using your arms (e.g., wheelchair or bedside chair)?: A Little Help needed to walk in hospital room?: A Little Help needed climbing 3-5 steps with a railing? : A Little 6 Click Score: 20    End of Session Equipment Utilized During Treatment: Gait belt Activity Tolerance: Patient tolerated treatment well Patient left: in chair;with call bell/phone within reach Nurse Communication: Mobility status PT Visit Diagnosis: Unsteadiness on feet (R26.81)    Time: 6433-2951 PT Time Calculation (min) (ACUTE ONLY): 14 min   Charges:   PT Evaluation $PT Eval Low Complexity: 1 Low          Lanney Gins, PT, DPT Supplemental Physical Therapist 06/09/19 12:25 PM Pager: [phone] Office: [phone]

## 2019-06-09 NOTE — Plan of Care (Signed)
Plan of care is adequate for discharge.

## 2019-06-13 ENCOUNTER — Telehealth: Payer: Self-pay | Admitting: Family Medicine

## 2019-06-13 NOTE — Telephone Encounter (Signed)
Patient called to request provider look @ her medical chart , she went to ED & was admitted to [facility] 7/23 for 24hr, they confirm she was having a stroke --- Pt has appt w/ Dr. Leonie Man  neurologist 8/23 she just wanted to Thank Melissa for her insistence that she go to ED.  --Patient also states that they tweaked some of her meds and wanted Dr. Jenetta Downer to be sure to look at her chart .  --glh

## 2019-06-14 NOTE — Telephone Encounter (Signed)
Patient has follow up on 06/29/2019. MPulliam, CMA/RT(R)

## 2019-06-14 NOTE — Telephone Encounter (Signed)
FYI - Patient was seen for stroke in the ER 06/08/2019. MPulliam, CMA/RT(R)

## 2019-06-14 NOTE — Telephone Encounter (Signed)
After she is seen by the neurologist for her stroke as an OP, she will need to follow-up with me to go over all the medication changes and go over all of these recent events that occurred.  (If she looks at d/c papers, she was likely told to follow-up with me/her PCP after the ER visit, along with the neurologist for further management of her stroke.)

## 2019-06-28 ENCOUNTER — Encounter: Payer: Self-pay | Admitting: Family Medicine

## 2019-06-28 ENCOUNTER — Ambulatory Visit: Payer: BC Managed Care – PPO | Admitting: Family Medicine

## 2019-06-28 ENCOUNTER — Other Ambulatory Visit: Payer: Self-pay

## 2019-06-28 VITALS — BP 113/80 | HR 72 | Temp 98.8°F | Ht 64.0 in | Wt 157.6 lb

## 2019-06-28 DIAGNOSIS — E785 Hyperlipidemia, unspecified: Secondary | ICD-10-CM | POA: Diagnosis not present

## 2019-06-28 DIAGNOSIS — I10 Essential (primary) hypertension: Secondary | ICD-10-CM

## 2019-06-28 DIAGNOSIS — I639 Cerebral infarction, unspecified: Secondary | ICD-10-CM

## 2019-06-28 DIAGNOSIS — I6381 Other cerebral infarction due to occlusion or stenosis of small artery: Secondary | ICD-10-CM

## 2019-06-28 DIAGNOSIS — Z7901 Long term (current) use of anticoagulants: Secondary | ICD-10-CM

## 2019-06-28 DIAGNOSIS — R7303 Prediabetes: Secondary | ICD-10-CM

## 2019-06-28 MED ORDER — ATORVASTATIN CALCIUM 40 MG PO TABS: 40.0000 mg | ORAL_TABLET | Freq: Every day | ORAL | 1 refills | Status: DC

## 2019-06-28 NOTE — Progress Notes (Signed)
Hospital follow-up   Impression and Recommendations:    1. Left thalamic infarction ([place])- new onset   2. Anticoagulated- plavix    3. Prediabetes- newly Dx Feb 2020   4. Hyperlipidemia with target LDL less than 130   5. Essential hypertension, benign      Left thalamic infarction ([place])- new onset - Plan: CBC with Differential/Platelet, Basic Metabolic Panel (BMET), atorvastatin (LIPITOR) 40 MG tablet  Anticoagulated- plavix  - Plan: CBC with Differential/Platelet, Basic Metabolic Panel (BMET)  Prediabetes- newly Dx Feb 2020 - Plan: CBC with Differential/Platelet, Basic Metabolic Panel (BMET)  Hyperlipidemia with target LDL less than 130 - Plan: CBC with Differential/Platelet, Basic Metabolic Panel (BMET), atorvastatin (LIPITOR) 40 MG tablet  Essential hypertension, benign - Plan: CBC with Differential/Platelet, Basic Metabolic Panel (BMET)    [place] from Recent Acute Ischemic Stroke - Left Thalamic Infarction - Pt arrived in hospital on 06/08/2019, discharged 06/09/2019.  - Pt symptoms started afternoon of 06/07/2019 with numbness around her lips. These symptoms worsened to include the pt's ride side of face from eye down, right arm, and right leg.  No weakness.  - Per pt, currently experiencing right-sided numbness and tingling, but otherwise well.  -Regarding pt's recent hospitalization and/or ED visit: reviewed in great detail recent hospitalization notes, clinical lab tests, tests in the radiology section of CPT, tests in the medicine testing of CPT, and obtained history from family member. -Independent visualization of image, tracing, or specimen itself done by me today at point of care delivery; directly influenced my plan of care for patient. Moderate complexity  - Per pt, follows up with neurology on the 24th of August 2020. - Advised patient that neurology will manage patient's anticoagulation. - Currently managed on aspirin and Plavix.  - Labs  drawn today.  - Discussed cholesterol, high blood pressure, and blood sugar can all contribute to strokes.  Reviewed that our goal is to control these and optimize patient's health to minimize her chance of having a second stroke.  - Encouraged prudent lifestyle habits to control blood sugar, blood pressure, and cholesterol. - Extensive education provided and all questions were answered.   Prediabetes - A1c in the past on 12/21/2018 was 5.9. - Increased to 6.1 in six months.  - Discussed need to abide by a prudent, low carb diet. - Handout provided today on prediabetes and glycemic index of foods. - Education provided today and all questions were answered.  - Recommended referral to diabetes educator/nutritionist. - Patient declines at this time and desires to try on her own first. - Patient knows to let us know if she desires referral to diabetes educator in future.  - Will continue to monitor.   Essential Hypertension - BP stable at this time. - Continue treatment plan as prescribed.  See med list below. - Patient tolerating meds well without complication.  Denies S-E  - Discussed goal BP of 120/80 or less as long as patient is not experiencing dizziness. - Ongoing ambulatory blood pressure monitoring encouraged.  - Will continue to monitor.   Hyperlipidemia - Last checked here in office 12/21/2018. - Statin dose will need to be increased after acute ischemic stroke. - Discussed need to continue adjusted treatment plan for tighter control of patient's cholesterol. - Emphasized importance of adequate hydration and exercise to minimize S-E of meds.  - Discussed need to be sure to decrease LDL to 70 or less. - Encouraged patient to continue to maintain high HDL value through  exercise.  - Will continue to monitor.   Chronic Arthritis & Fibromyalgia - Advised patient to exercise, swim, consume a prudent diet, and treat her sx naturally. - Discussed need to avoid all medicines  and only treat naturally, especially w/ daily exercise.  - Will continue to monitor.   BMI Counseling - Body mass index is 27.05 kg/m Explained to patient what BMI refers to, and what it means medically.  Told patient to think about it as a "medical risk stratification measurement" and how increasing BMI is associated with increasing risk/ or worsening state of various diseases such as hypertension, hyperlipidemia, diabetes, premature OA, depression etc.  American Heart Association guidelines for healthy diet, basically Mediterranean diet, and exercise guidelines of 30 minutes 5 days per week or more discussed in detail.  Health counseling performed.  All questions answered.   Lifestyle & Preventative Health Maintenance - Advised patient to continue working toward exercising to improve overall mental, physical, and emotional health.    - Reviewed the "spokes of the wheel" of chronic health management.  Stressed the importance of ongoing prudent habits, including regular exercise, appropriate sleep hygiene, healthful dietary habits, and prayer/meditation to relax.  - Encouraged patient to engage in daily physical activity, especially a formal exercise routine.  Recommended that the patient eventually strive for at least 150 minutes of moderate cardiovascular activity per week according to guidelines established by the [facility].   - Healthy dietary habits encouraged, including low-carb, and high amounts of lean protein in diet.   - Patient should also consume adequate amounts of water.   Recommendations - Re-check CMP, fasting lipid profile, and A1c in 4 months. - Will need yearly ultrasound of carotid, next in August of 2021.  - Reviewed need for ongoing chronic care appointments and health maintenance.   Orders Placed This Encounter  Procedures  . CBC with Differential/Platelet  . Basic Metabolic Panel (BMET)    Meds ordered this encounter  Medications  . atorvastatin (LIPITOR) 40  MG tablet    Sig: Take 1 tablet (40 mg total) by mouth at bedtime.    Dispense:  90 tablet    Refill:  1    Medications Discontinued During This Encounter  Medication Reason  . atorvastatin (LIPITOR) 40 MG tablet Reorder     Gross side effects, risk and benefits, and alternatives of medications and treatment plan in general discussed with patient.  Patient is aware that all medications have potential side effects and we are unable to predict every side effect or drug-drug interaction that may occur.   Patient will call with any questions prior to using medication if they have concerns.    Expresses verbal understanding and consents to current therapy and treatment regimen.  No barriers to understanding were identified.  Red flag symptoms and signs discussed in detail.  Patient expressed understanding regarding what to do in case of emergency\urgent symptoms  Please see AVS handed out to patient at the end of our visit for further patient instructions/ counseling done pertaining to today's office visit.   Return for 22mo- reck CMP, FLP, A1c then ov with me 3 d later.     Note:  This note was prepared with assistance of Dragon voice recognition software. Occasional wrong-word or sound-a-like substitutions may have occurred due to the inherent limitations of voice recognition software.   This document serves as a record of services personally performed by [name], DO. It was created on her behalf by Toni Amend, a trained medical  scribe. The creation of this record is based on the scribe's personal observations and the provider's statements to them.   I have reviewed the above medical documentation for accuracy and completeness and I concur.  [name], DO 06/29/2019 8:34 PM     --------------------------------------------------------------------------------------------------------------------------------------------------------------------------------------------------------------------------------------------    Subjective:     HPI: Pam Scott is a 73 y.o. female who presents to [facility] today for issues as discussed below.  Says her Lyrica doesn't seem to work and doesn't seem to help with her aches and fibromyalgia.  Says she has no problem walking and usually walks 30 minutes per day.  Hospital Follow-Up States before her stroke, she spent two weeks in [place] with a new grand baby.  Went home on a Saturday, and had her stroke a few days later.  She woke up, had numbness that eventually spread throughout her right side.  Confirms that her blood pressure at the time was high, 144/89, 146/87.  Recovery Becomes tearful discussing her health habits.  Says "I want to be healthy.  I know I need to do better."  Confirms having the stroke was shocking and scary.  Says her fingers still feel numb, and her lips are still numb.  "My lips are not as much as they were," and slowly improving.  Says her fingers are "definnitely numb."  Denies any symptoms worsening and confirms good compliance with treatment plan.  Patient confirms she was put on aspirin for life and Plavix for three weeks.  Prediabetes Says she's working on this now; "gave up my sweet tea."  Says "I'm not a big sugar sweet eater, let's put it that way."  She is now also trying to limit her wine.  Says "I haven't drank but two glasses in the past month."  Says she enjoys drinking wine, "but I know it's not good."     Lab Results  Component Value Date   HGBA1C 6.1 (H) 06/09/2019   HGBA1C 5.9 (H) 12/21/2018    Lab Results  Component Value Date   LDLCALC 77 06/09/2019   CREATININE 1.00 06/28/2019    Wt Readings from Last 3 Encounters:  07/11/19 152 lb  (68.9 kg)  06/28/19 157 lb 9.6 oz (71.5 kg)  06/08/19 160 lb 4.8 oz (72.7 kg)    1. HTN HPI:  -  Her blood pressure has been controlled at home.  Pt is checking it at home.  126/72, 123/75, 121/79, 119/76 at home.  - Patient reports good compliance with blood pressure medications  - Denies medication S-E   - Smoking Status noted   - She denies new onset of: chest pain, exercise intolerance, shortness of breath, dizziness, visual changes, headache, lower extremity swelling or claudication.   Last 3 blood pressure readings in our office are as follows: BP Readings from Last 3 Encounters:  07/11/19 116/79  06/28/19 113/80  06/09/19 (!) 141/89    Filed Weights   06/28/19 1050  Weight: 157 lb 9.6 oz (71.5 kg)    Pulse Readings from Last 3 Encounters:  07/11/19 71  06/28/19 72  06/09/19 69   BMI Readings from Last 3 Encounters:  07/11/19 26.09 kg/m  06/28/19 27.05 kg/m  06/08/19 27.52 kg/m   Recent Results (from the past 2160 hour(s))  CBC     Status: None   Collection Time: 06/08/19 10:24 AM  Result Value Ref Range   WBC 6.4 4.0 - 10.5 K/uL   RBC 4.33 3.87 - 5.11 MIL/uL  Hemoglobin 12.9 12.0 - 15.0 g/dL   HCT 40.3 36.0 - 46.0 %   MCV 93.1 80.0 - 100.0 fL   MCH 29.8 26.0 - 34.0 pg   MCHC 32.0 30.0 - 36.0 g/dL   RDW 13.1 11.5 - 15.5 %   Platelets 205 150 - 400 K/uL   nRBC 0.0 0.0 - 0.2 %    Comment: Performed at [facility], [address] [place], [place] [address]  Comprehensive metabolic panel     Status: Abnormal   Collection Time: 06/08/19 10:24 AM  Result Value Ref Range   Sodium 140 135 - 145 mmol/L   Potassium 4.1 3.5 - 5.1 mmol/L   Chloride 103 98 - 111 mmol/L   CO2 28 22 - 32 mmol/L   Glucose, Bld 98 70 - 99 mg/dL   BUN 18 8 - 23 mg/dL   Creatinine, Ser 1.00 0.44 - 1.00 mg/dL   Calcium 9.5 8.9 - 10.3 mg/dL   Total Protein 7.3 6.5 - 8.1 g/dL   Albumin 4.2 3.5 - 5.0 g/dL   AST 24 15 - 41 U/L   ALT 30 0 - 44 U/L   Alkaline  Phosphatase 56 38 - 126 U/L   Total Bilirubin 0.7 0.3 - 1.2 mg/dL   GFR calc non Af Amer 57 (L) >60 mL/min   GFR calc Af Amer >60 >60 mL/min   Anion gap 9 5 - 15    Comment: Performed at [facility] [address] [place], [place] [address]  SARS Coronavirus 2 (Performed in [facility] hospital lab)     Status: None   Collection Time: 06/08/19 11:54 AM   Specimen: Nasopharyngeal Swab  Result Value Ref Range   SARS Coronavirus 2 NEGATIVE NEGATIVE    Comment: (NOTE) If result is NEGATIVE SARS-CoV-2 target nucleic acids are NOT DETECTED. The SARS-CoV-2 RNA is generally detectable in upper and lower  respiratory specimens during the acute phase of infection. The lowest  concentration of SARS-CoV-2 viral copies this assay can detect is 250  copies / mL. A negative result does not preclude SARS-CoV-2 infection  and should not be used as the sole basis for treatment or other  patient management decisions.  A negative result may occur with  improper specimen collection / handling, submission of specimen other  than nasopharyngeal swab, presence of viral mutation(s) within the  areas targeted by this assay, and inadequate number of viral copies  (<250 copies / mL). A negative result must be combined with clinical  observations, patient history, and epidemiological information. If result is POSITIVE SARS-CoV-2 target nucleic acids are DETECTED. The SARS-CoV-2 RNA is generally detectable in upper and lower  respiratory specimens dur ing the acute phase of infection.  Positive  results are indicative of active infection with SARS-CoV-2.  Clinical  correlation with patient history and other diagnostic information is  necessary to determine patient infection status.  Positive results do  not rule out bacterial infection or co-infection with other viruses. If result is PRESUMPTIVE POSTIVE SARS-CoV-2 nucleic acids MAY BE PRESENT.   A presumptive positive result was obtained on the  submitted specimen  and confirmed on repeat testing.  While 2019 novel coronavirus  (SARS-CoV-2) nucleic acids may be present in the submitted sample  additional confirmatory testing may be necessary for epidemiological  and / or clinical management purposes  to differentiate between  SARS-CoV-2 and other Sarbecovirus currently known to infect humans.  If clinically indicated additional  testing with an alternate test  methodology [phone]) is advised. The SARS-CoV-2 RNA is generally  detectable in upper and lower respiratory sp ecimens during the acute  phase of infection. The expected result is Negative. Fact Sheet for Patients:  StrictlyIdeas.no Fact Sheet for Healthcare Providers: BankingDealers.co.za This test is not yet approved or cleared by the [place] FDA and has been authorized for detection and/or diagnosis of SARS-CoV-2 by FDA under an Emergency Use Authorization (EUA).  This EUA will remain in effect (meaning this test can be used) for the duration of the COVID-19 declaration under Section 564(b)(1) of the Act, 21 U.S.C. section 360bbb-3(b)(1), unless the authorization is terminated or revoked sooner. Performed at [facility], [address] [place], [place] [address]   CBC     Status: None   Collection Time: 06/08/19  4:53 PM  Result Value Ref Range   WBC 7.2 4.0 - 10.5 K/uL   RBC 4.29 3.87 - 5.11 MIL/uL   Hemoglobin 13.0 12.0 - 15.0 g/dL   HCT 39.1 36.0 - 46.0 %   MCV 91.1 80.0 - 100.0 fL   MCH 30.3 26.0 - 34.0 pg   MCHC 33.2 30.0 - 36.0 g/dL   RDW 13.1 11.5 - 15.5 %   Platelets 202 150 - 400 K/uL   nRBC 0.0 0.0 - 0.2 %    Comment: Performed at [facility], [place] [address], [place], [place] [address]  Creatinine, serum     Status: None   Collection Time: 06/08/19  4:53 PM  Result Value Ref Range   Creatinine, Ser 0.95 0.44 - 1.00 mg/dL   GFR calc non Af Amer >60 >60 mL/min   GFR calc Af  Amer >60 >60 mL/min    Comment: Performed at [facility] [address], [place], [place] [address]  Hemoglobin A1c     Status: Abnormal   Collection Time: 06/09/19  3:35 AM  Result Value Ref Range   Hgb A1c MFr Bld 6.1 (H) 4.8 - 5.6 %    Comment: (NOTE) Pre diabetes:          5.7%-6.4% Diabetes:              >6.4% Glycemic control for   <7.0% adults with diabetes    Mean Plasma Glucose 128.37 mg/dL    Comment: Performed at [facility] [address] [place], [place] [address]  Lipid panel     Status: None   Collection Time: 06/09/19  3:35 AM  Result Value Ref Range   Cholesterol 164 0 - 200 mg/dL   Triglycerides 115 <150 mg/dL   HDL 64 >40 mg/dL   Total CHOL/HDL Ratio 2.6 RATIO   VLDL 23 0 - 40 mg/dL   LDL Cholesterol 77 0 - 99 mg/dL    Comment:        Total Cholesterol/HDL:CHD Risk Coronary Heart Disease Risk Table                     Men   Women  1/2 Average Risk   3.4   3.3  Average Risk       5.0   4.4  2 X Average Risk   9.6   7.1  3 X Average Risk  23.4   11.0        Use the calculated Patient Ratio above and the CHD Risk Table to determine the patient's CHD Risk.  ATP III CLASSIFICATION (LDL):  <100     mg/dL   Optimal  100-129  mg/dL   Near or Above                    Optimal  130-159  mg/dL   Borderline  160-189  mg/dL   High  >190     mg/dL   Very High Performed at [facility] [address] [place], [place] [address]   ECHOCARDIOGRAM COMPLETE     Status: None   Collection Time: 06/09/19  9:12 AM  Result Value Ref Range   Weight 2,564.8 oz   Height 64 in   BP 141/89 mmHg  CBC with Differential/Platelet     Status: None   Collection Time: 06/28/19 11:18 AM  Result Value Ref Range   WBC 7.0 3.4 - 10.8 x10E3/uL   RBC 4.66 3.77 - 5.28 x10E6/uL   Hemoglobin 14.0 11.1 - 15.9 g/dL   Hematocrit 42.2 34.0 - 46.6 %   MCV 91 79 - 97 fL   MCH 30.0 26.6 - 33.0 pg   MCHC 33.2 31.5 - 35.7 g/dL   RDW 13.1 11.7 - 15.4 %    Platelets 201 150 - 450 x10E3/uL   Neutrophils 58 Not Estab. %   Lymphs 29 Not Estab. %   Monocytes 9 Not Estab. %   Eos 3 Not Estab. %   Basos 1 Not Estab. %   Neutrophils Absolute 4.1 1.4 - 7.0 x10E3/uL   Lymphocytes Absolute 2.0 0.7 - 3.1 x10E3/uL   Monocytes Absolute 0.6 0.1 - 0.9 x10E3/uL   EOS (ABSOLUTE) 0.2 0.0 - 0.4 x10E3/uL   Basophils Absolute 0.0 0.0 - 0.2 x10E3/uL   Immature Granulocytes 0 Not Estab. %   Immature Grans (Abs) 0.0 0.0 - 0.1 T51V6/HY  Basic Metabolic Panel (BMET)     Status: Abnormal   Collection Time: 06/28/19 11:18 AM  Result Value Ref Range   Glucose 87 65 - 99 mg/dL   BUN 16 8 - 27 mg/dL   Creatinine, Ser 1.00 0.57 - 1.00 mg/dL   GFR calc non Af Amer 57 (L) >59 mL/min/1.73   GFR calc Af Amer 65 >59 mL/min/1.73   BUN/Creatinine Ratio 16 12 - 28   Sodium 145 (H) 134 - 144 mmol/L   Potassium 3.7 3.5 - 5.2 mmol/L   Chloride 103 96 - 106 mmol/L   CO2 25 20 - 29 mmol/L   Calcium 10.1 8.7 - 10.3 mg/dL     Patient Care Team    Relationship Specialty Notifications Start End  [name], DO PCP - General Family Medicine  10/11/18   Marchia Bond, MD Consulting Physician Orthopedic Surgery  10/12/18   Levy Sjogren, MD Referring Physician Dermatology  10/12/18   Wallene Huh, [facility] Consulting Physician Podiatry  10/12/18   Juluis Rainier  Optometry  10/12/18      Patient Active Problem List   Diagnosis Date Noted  . Hyperlipidemia with target LDL less than 130 11/30/2009    Priority: High  . Essential hypertension, benign 11/30/2009    Priority: High  . Neuropathic pain-due to fibromyalgia, sees Dr. Lenna Gilford of rheumatology 10/12/2018    Priority: Medium  . DEPRESSIVE DISORDER 11/30/2009    Priority: Medium  . Osteoporosis 11/30/2009    Priority: Medium  . Vitamin D deficiency 10/12/2018    Priority: Low  . Chronic foot pain, right-and ankle; sees Dr. Paulla Dolly of podiatry  10/12/2018  Priority: Low  . Long-term current use of  opiate analgesic 04/13/2018    Priority: Low  . Fibromyalgia muscle pain 07/28/2012    Priority: Low  . Anticoagulated 06/28/2019  . Numbness and tingling of right side of face 06/08/2019  . Left thalamic infarction ([place]) 06/08/2019  . Prediabetes- newly Dx Feb 2020 12/29/2018  . History of adjustment disorder 10/12/2018  . Allergic contact dermatitis due to plants, except food 03/15/2018  . Low back pain at multiple sites 01/21/2018  . Estrogen deficiency 09/16/2016  . Primary osteoarthritis of right shoulder 04/17/2015  . Nonspecific abnormal electrocardiogram (ECG) (EKG) 06/28/2014  . Middle insomnia 06/28/2014  . Cervical radiculitis 07/06/2013  . Bradycardia 06/23/2013  . Routine general medical examination at a health care facility 04/19/2012  . Allergic rhinitis due to other allergen 03/20/2010    Past Medical history, Surgical history, Family history, Social history, Allergies and Medications have been entered into the medical record, reviewed and changed as needed.    Current Meds  Medication Sig  . aspirin 81 MG EC tablet Take 1 tablet (81 mg total) by mouth daily.  [place] Kitchen atorvastatin (LIPITOR) 40 MG tablet Take 1 tablet (40 mg total) by mouth at bedtime.  . bimatoprost (LUMIGAN) 0.03 % ophthalmic drops Place 1 drop into both eyes at bedtime.   . Cholecalciferol (VITAMIN D3) 2000 UNITS TABS Take 1 tablet by mouth daily.   . clopidogrel (PLAVIX) 75 MG tablet Take 1 tablet (75 mg total) by mouth daily.  . hydrochlorothiazide (MICROZIDE) 12.5 MG capsule Take 1 capsule (12.5 mg total) by mouth daily.  Mar[place] Kitchen olmesartan (BENICAR) 20 MG tablet Take 1 tablet (20 mg total) by mouth daily.  . pregabalin (LYRICA) 75 MG capsule Take 1 capsule (75 mg total) by mouth 2 (two) times daily. (Patient taking differently: Take 75 mg by mouth daily. )  . [DISCONTINUED] atorvastatin (LIPITOR) 40 MG tablet Take 1 tablet (40 mg total) by mouth daily at 6 PM.    Allergies:  Allergies  Allergen  Reactions  . Amlodipine     edema  . Simvastatin     myalgias  . Erythromycin      Review of Systems:  A fourteen system review of systems was performed and found to be positive as per HPI.   Objective:   Blood pressure 113/80, pulse 72, temperature 98.8 F (37.1 C), height 5\' 4"  (1.626 m), weight 157 lb 9.6 oz (71.5 kg), SpO2 97 %. Body mass index is 27.05 kg/m. General:  Well Developed, well nourished, appropriate for stated age.  Neuro:  Alert and oriented,  extra-ocular muscles intact  HEENT:  Normocephalic, atraumatic, neck supple, no carotid bruits appreciated  Skin:  no gross rash, warm, pink. Cardiac:  RRR, S1 S2 Respiratory:  ECTA B/L and A/P, Not using accessory muscles, speaking in full sentences- unlabored. Vascular:  Ext warm, no cyanosis apprec.; cap RF less 2 sec. Psych:  No HI/SI, judgement and insight good, Euthymic mood. Full Affect.

## 2019-06-28 NOTE — Patient Instructions (Signed)
American diabetes Association and please read about glucose intolerance or prediabetes.  Please let me know if you would like to go to the diabetes educator to learn more about what foods to eat and not eat etc.     Risk factors for prediabetes and type 2 diabetes  Researchers don't fully understand why some people develop prediabetes and type 2 diabetes and others don't.  It's clear that certain factors increase the risk, however, including:  Weight. The more fatty tissue you have, the more resistant your cells become to insulin.  Inactivity. The less active you are, the greater your risk. Physical activity helps you control your weight, uses up glucose as energy and makes your cells more sensitive to insulin.  Family history. Your risk increases if a parent or sibling has type 2 diabetes.  Race. Although it's unclear why, people of certain races - including blacks, Hispanics, American Indians and Asian-Americans - are at higher risk.  Age. Your risk increases as you get older. This may be because you tend to exercise less, lose muscle mass and gain weight as you age. But type 2 diabetes is also increasing dramatically among children, adolescents and younger adults.  Gestational diabetes. If you developed gestational diabetes when you were pregnant, your risk of developing prediabetes and type 2 diabetes later increases. If you gave birth to a baby weighing more than 9 pounds (4 kilograms), you're also at risk of type 2 diabetes.  Polycystic ovary syndrome. For women, having polycystic ovary syndrome - a common condition characterized by irregular menstrual periods, excess hair growth and obesity - increases the risk of diabetes.  High blood pressure. Having blood pressure over 140/90 millimeters of mercury (mm Hg) is linked to an increased risk of type 2 diabetes.  Abnormal cholesterol and triglyceride levels. If you have low levels of high-density lipoprotein (HDL), or "good," cholesterol, your  risk of type 2 diabetes is higher. Triglycerides are another type of fat carried in the blood. People with high levels of triglycerides have an increased risk of type 2 diabetes. Your doctor can let you know what your cholesterol and triglyceride levels are.  A good guide to good carbs: The glycemic index ---If you have diabetes, or at risk for diabetes, you know all too well that when you eat carbohydrates, your blood sugar goes up. The total amount of carbs you consume at a meal or in a snack mostly determines what your blood sugar will do. But the food itself also plays a role. A serving of white rice has almost the same effect as eating pure table sugar - a quick, high spike in blood sugar. A serving of lentils has a slower, smaller effect.  ---Picking good sources of carbs can help you control your blood sugar and your weight. Even if you don't have diabetes, eating healthier carbohydrate-rich foods can help ward off a host of chronic conditions, from heart disease to various cancers to, well, diabetes.  ---One way to choose foods is with the glycemic index (GI). This tool measures how much a food boosts blood sugar.  The glycemic index rates the effect of a specific amount of a food on blood sugar compared with the same amount of pure glucose. A food with a glycemic index of 28 boosts blood sugar only 28% as much as pure glucose. One with a GI of 95 acts like pure glucose.    High glycemic foods result in a quick spike in insulin and blood sugar (also known  as blood glucose).  Low glycemic foods have a slower, smaller effect- these are healthier for you.   Using the glycemic index Using the glycemic index is easy: choose foods in the low GI category instead of those in the high GI category (see below), and go easy on those in between. Low glycemic index (GI of 55 or less): Most fruits and vegetables, beans, minimally processed grains, pasta, low-fat dairy foods, and nuts.  Moderate glycemic  index (GI 56 to 69): White and sweet potatoes, corn, white rice, couscous, breakfast cereals such as Cream of Wheat and Mini Wheats.  High glycemic index (GI of 70 or higher): White bread, rice cakes, most crackers, bagels, cakes, doughnuts, croissants, most packaged breakfast cereals. You can see the values for 100 commons foods and get links to more at www.health.CheapToothpicks.si.  Swaps for lowering glycemic index  Instead of this high-glycemic index food Eat this lower-glycemic index food  White rice Brown rice or converted rice  Instant oatmeal Steel-cut oats  Cornflakes Bran flakes  Baked potato Pasta, bulgur  White bread Whole-grain bread  Corn Peas or leafy greens       Prediabetes Eating Plan  Prediabetes--also called impaired glucose tolerance or impaired fasting glucose--is a condition that causes blood sugar (blood glucose) levels to be higher than normal. Following a healthy diet can help to keep prediabetes under control. It can also help to lower the risk of type 2 diabetes and heart disease, which are increased in people who have prediabetes. Along with regular exercise, a healthy diet:  Promotes weight loss.  Helps to control blood sugar levels.  Helps to improve the way that the body uses insulin.   WHAT DO I NEED TO KNOW ABOUT THIS EATING PLAN?   Use the glycemic index (GI) to plan your meals. The index tells you how quickly a food will raise your blood sugar. Choose low-GI foods. These foods take a longer time to raise blood sugar.  Pay close attention to the amount of carbohydrates in the food that you eat. Carbohydrates increase blood sugar levels.  Keep track of how many calories you take in. Eating the right amount of calories will help you to achieve a healthy weight. Losing about 7 percent of your starting weight can help to prevent type 2 diabetes.  You may want to follow a Mediterranean diet. This diet includes a lot of vegetables, lean meats or  fish, whole grains, fruits, and healthy oils and fats.   WHAT FOODS CAN I EAT?  Grains Whole grains, such as whole-wheat or whole-grain breads, crackers, cereals, and pasta. Unsweetened oatmeal. Bulgur. Barley. Quinoa. Brown rice. Corn or whole-wheat flour tortillas or taco shells. Vegetables Lettuce. Spinach. Peas. Beets. Cauliflower. Cabbage. Broccoli. Carrots. Tomatoes. Squash. Eggplant. Herbs. Peppers. Onions. Cucumbers. Brussels sprouts. Fruits Berries. Bananas. Apples. Oranges. Grapes. Papaya. Mango. Pomegranate. Kiwi. Grapefruit. Cherries. Meats and Other Protein Sources Seafood. Lean meats, such as chicken and [place] or lean cuts of pork and beef. Tofu. Eggs. Nuts. Beans. Dairy Low-fat or fat-free dairy products, such as yogurt, cottage cheese, and cheese. Beverages Water. Tea. Coffee. Sugar-free or diet soda. Seltzer water. Milk. Milk alternatives, such as soy or almond milk. Condiments Mustard. Relish. Low-fat, low-sugar ketchup. Low-fat, low-sugar barbecue sauce. Low-fat or fat-free mayonnaise. Sweets and Desserts Sugar-free or low-fat pudding. Sugar-free or low-fat ice cream and other frozen treats. Fats and Oils Avocado. Walnuts. Olive oil. The items listed above may not be a complete list of recommended foods or beverages. Contact  your dietitian for more options.    WHAT FOODS ARE NOT RECOMMENDED?  Grains Refined white flour and flour products, such as bread, pasta, snack foods, and cereals. Beverages Sweetened drinks, such as sweet iced tea and soda. Sweets and Desserts Baked goods, such as cake, cupcakes, pastries, cookies, and cheesecake. The items listed above may not be a complete list of foods and beverages to avoid. Contact your dietitian for more information.   This information is not intended to replace advice given to you by your health care provider. Make sure you discuss any questions you have with your health care provider.   Document Released:  03/20/2015 Document Reviewed: 03/20/2015 Elsevier Interactive Patient Education [employer].

## 2019-06-29 ENCOUNTER — Inpatient Hospital Stay: Payer: BC Managed Care – PPO | Admitting: Family Medicine

## 2019-06-29 LAB — BASIC METABOLIC PANEL
BUN/Creatinine Ratio: 16 (ref 12–28)
BUN: 16 mg/dL (ref 8–27)
CO2: 25 mmol/L (ref 20–29)
Calcium: 10.1 mg/dL (ref 8.7–10.3)
Chloride: 103 mmol/L (ref 96–106)
Creatinine, Ser: 1 mg/dL (ref 0.57–1.00)
GFR calc Af Amer: 65 mL/min/{1.73_m2} (ref >59)
GFR calc non Af Amer: 57 mL/min/{1.73_m2} — ABNORMAL LOW (ref >59)
Glucose: 87 mg/dL (ref 65–99)
Potassium: 3.7 mmol/L (ref 3.5–5.2)
Sodium: 145 mmol/L — ABNORMAL HIGH (ref 134–144)

## 2019-06-29 LAB — CBC WITH DIFFERENTIAL/PLATELET
Basophils Absolute: 0 10*3/uL (ref 0.0–0.2)
Basos: 1 %
EOS (ABSOLUTE): 0.2 10*3/uL (ref 0.0–0.4)
Eos: 3 %
Hematocrit: 42.2 % (ref 34.0–46.6)
Hemoglobin: 14 g/dL (ref 11.1–15.9)
Immature Grans (Abs): 0 10*3/uL (ref 0.0–0.1)
Immature Granulocytes: 0 %
Lymphocytes Absolute: 2 10*3/uL (ref 0.7–3.1)
Lymphs: 29 %
MCH: 30 pg (ref 26.6–33.0)
MCHC: 33.2 g/dL (ref 31.5–35.7)
MCV: 91 fL (ref 79–97)
Monocytes Absolute: 0.6 10*3/uL (ref 0.1–0.9)
Monocytes: 9 %
Neutrophils Absolute: 4.1 10*3/uL (ref 1.4–7.0)
Neutrophils: 58 %
Platelets: 201 10*3/uL (ref 150–450)
RBC: 4.66 x10E6/uL (ref 3.77–5.28)
RDW: 13.1 % (ref 11.7–15.4)
WBC: 7 10*3/uL (ref 3.4–10.8)

## 2019-07-01 DIAGNOSIS — M25531 Pain in right wrist: Secondary | ICD-10-CM | POA: Diagnosis not present

## 2019-07-11 ENCOUNTER — Encounter: Payer: Self-pay | Admitting: Neurology

## 2019-07-11 ENCOUNTER — Other Ambulatory Visit: Payer: Self-pay

## 2019-07-11 ENCOUNTER — Ambulatory Visit: Payer: BC Managed Care – PPO | Admitting: Neurology

## 2019-07-11 VITALS — BP 116/79 | HR 71 | Temp 98.6°F | Wt 152.0 lb

## 2019-07-11 DIAGNOSIS — I639 Cerebral infarction, unspecified: Secondary | ICD-10-CM

## 2019-07-11 DIAGNOSIS — R202 Paresthesia of skin: Secondary | ICD-10-CM | POA: Diagnosis not present

## 2019-07-11 DIAGNOSIS — I6381 Other cerebral infarction due to occlusion or stenosis of small artery: Secondary | ICD-10-CM

## 2019-07-11 NOTE — Patient Instructions (Signed)
I had a long d/w patient about her recent lacunar stroke,post stroke paresthesias, risk for recurrent stroke/TIAs, personally independently reviewed imaging studies and stroke evaluation results and answered questions.Are improving and are not bothersome enough to justify a trial of medications at the present time.Her paresthesias.  Her paresthesias are improving and are not bothersome enough at the present time to justify trial of medications.  I expect this to improve gradually over the next few months.  Continue aspirin 81 mg daily  for secondary stroke prevention and maintain strict control of hypertension with blood pressure goal below 130/90, diabetes with hemoglobin A1c goal below 6.5% and lipids with LDL cholesterol goal below 70 mg/dL. I also advised the patient to eat a healthy diet with plenty of whole grains, cereals, fruits and vegetables, exercise regularly and maintain ideal body weight Followup in the future with my nurse practitioner Janett Billow in 3 months or call earlier if necessary.  Stroke Prevention Some medical conditions and behaviors are associated with a higher chance of having a stroke. You can help prevent a stroke by making nutrition, lifestyle, and other changes, including managing any medical conditions you may have. What nutrition changes can be made?   Eat healthy foods. You can do this by: ? Choosing foods high in fiber, such as fresh fruits and vegetables and whole grains. ? Eating at least 5 or more servings of fruits and vegetables a day. Try to fill half of your plate at each meal with fruits and vegetables. ? Choosing lean protein foods, such as lean cuts of meat, poultry without skin, fish, tofu, beans, and nuts. ? Eating low-fat dairy products. ? Avoiding foods that are high in salt (sodium). This can help lower blood pressure. ? Avoiding foods that have saturated fat, trans fat, and cholesterol. This can help prevent high cholesterol. ? Avoiding processed and  premade foods.  Follow your health care provider's specific guidelines for losing weight, controlling high blood pressure (hypertension), lowering high cholesterol, and managing diabetes. These may include: ? Reducing your daily calorie intake. ? Limiting your daily sodium intake to 1,500 milligrams (mg). ? Using only healthy fats for cooking, such as olive oil, canola oil, or sunflower oil. ? Counting your daily carbohydrate intake. What lifestyle changes can be made?  Maintain a healthy weight. Talk to your health care provider about your ideal weight.  Get at least 30 minutes of moderate physical activity at least 5 days a week. Moderate activity includes brisk walking, biking, and swimming.  Do not use any products that contain nicotine or tobacco, such as cigarettes and e-cigarettes. If you need help quitting, ask your health care provider. It may also be helpful to avoid exposure to secondhand smoke.  Limit alcohol intake to no more than 1 drink a day for nonpregnant women and 2 drinks a day for men. One drink equals 12 oz of beer, 5 oz of wine, or 1 oz of hard liquor.  Stop any illegal drug use.  Avoid taking birth control pills. Talk to your health care provider about the risks of taking birth control pills if: ? You are over 69 years old. ? You smoke. ? You get migraines. ? You have ever had a blood clot. What other changes can be made?  Manage your cholesterol levels. ? Eating a healthy diet is important for preventing high cholesterol. If cholesterol cannot be managed through diet alone, you may also need to take medicines. ? Take any prescribed medicines to control your cholesterol as  told by your health care provider.  Manage your diabetes. ? Eating a healthy diet and exercising regularly are important parts of managing your blood sugar. If your blood sugar cannot be managed through diet and exercise, you may need to take medicines. ? Take any prescribed medicines to  control your diabetes as told by your health care provider.  Control your hypertension. ? To reduce your risk of stroke, try to keep your blood pressure below 130/80. ? Eating a healthy diet and exercising regularly are an important part of controlling your blood pressure. If your blood pressure cannot be managed through diet and exercise, you may need to take medicines. ? Take any prescribed medicines to control hypertension as told by your health care provider. ? Ask your health care provider if you should monitor your blood pressure at home. ? Have your blood pressure checked every year, even if your blood pressure is normal. Blood pressure increases with age and some medical conditions.  Get evaluated for sleep disorders (sleep apnea). Talk to your health care provider about getting a sleep evaluation if you snore a lot or have excessive sleepiness.  Take over-the-counter and prescription medicines only as told by your health care provider. Aspirin or blood thinners (antiplatelets or anticoagulants) may be recommended to reduce your risk of forming blood clots that can lead to stroke.  Make sure that any other medical conditions you have, such as atrial fibrillation or atherosclerosis, are managed. What are the warning signs of a stroke? The warning signs of a stroke can be easily remembered as BEFAST.  B is for balance. Signs include: ? Dizziness. ? Loss of balance or coordination. ? Sudden trouble walking.  E is for eyes. Signs include: ? A sudden change in vision. ? Trouble seeing.  F is for face. Signs include: ? Sudden weakness or numbness of the face. ? The face or eyelid drooping to one side.  A is for arms. Signs include: ? Sudden weakness or numbness of the arm, usually on one side of the body.  S is for speech. Signs include: ? Trouble speaking (aphasia). ? Trouble understanding.  T is for time. ? These symptoms may represent a serious problem that is an emergency.  Do not wait to see if the symptoms will go away. Get medical help right away. Call your local emergency services (911 in the [place] Do not drive yourself to the hospital.  Other signs of stroke may include: ? A sudden, severe headache with no known cause. ? Nausea or vomiting. ? Seizure. Where to find more information For more information, visit:  American Stroke Association: www.strokeassociation.org  National Stroke Association: www.stroke.org Summary  You can prevent a stroke by eating healthy, exercising, not smoking, limiting alcohol intake, and managing any medical conditions you may have.  Do not use any products that contain nicotine or tobacco, such as cigarettes and e-cigarettes. If you need help quitting, ask your health care provider. It may also be helpful to avoid exposure to secondhand smoke.  Remember BEFAST for warning signs of stroke. Get help right away if you or a loved one has any of these signs. This information is not intended to replace advice given to you by your health care provider. Make sure you discuss any questions you have with your health care provider. Document Released: 12/11/2004 Document Revised: 10/16/2017 Document Reviewed: 12/09/2016 Elsevier Patient Education  2020 [employer].

## 2019-07-11 NOTE — Progress Notes (Signed)
[facility] [address] [place]. [place] [address] [id number]       OFFICE FOLLOW-UP NOTE  [name] Date of Birth:  11-22-1945 Medical Record Number:  [id number]   HPI: [name] is a pleasant 73 year old Caucasian lady seen today for initial office follow-up visit following hospital admission for stroke in July 2020.  History is obtained from the patient and review of electronic medical records and I personally reviewed imaging films in PACS.  She is a pleasant 73 year old lady with history of osteoporosis, hypertension, hyperlipidemia and fibromyalgia.  She presented with sudden onset of right face, hand and foot paresthesias and tingling and numbness on 06/07/2019.  She did not think much about it and actually went to sleep that night.  When she woke up she noticed the numbness involving the right face as well as leg which was worse.  She came to the emergency room and neurology was consulted.  She had NIH stroke scale of 1 for sensory disturbance on the right side.  CT scan of the head was unremarkable.  MRI scan showed a dorsolateral left thalamic infarct.  MRI of the brain and neck both did not show any significant large vessel stenosis or occlusion.  Echocardiogram showed normal ejection fraction.  LDL cholesterol was borderline at 77 mg percent and hemoglobin A1c was 6.1.  Patient was taking Lipitor 20 mg daily and dose was increased to 40 mg.  She was placed on dual antiplatelet therapy aspirin and Plavix for 3 weeks and has since stopped Plavix and is on aspirin alone now.  She states she is noticed improvement in right leg numbness and and numbness is improved however she still has some tingling and numbness around her right lips as well as fingers of the right hand.  She states she is tolerating aspirin well without bruising or bleeding.  Her blood pressures well controlled today it is 116/79.  She recently had some lab work done by her primary physician  which included BMP and CBC which were unremarkable.  She has another appointment in 4 months to have annual physical exam as well as lipid profile checked.  She has no new complaints today.  ROS:   14 system review of systems is positive for numbness, tingling only and all other systems negative  PMH:  Past Medical History:  Diagnosis Date   Fibromyalgia    Hyperlipidemia    Hypertension    Osteoporosis     Social History:  Social History   Socioeconomic History   Marital status: Married    Spouse name: Not on file   Number of children: Not on file   Years of education: Not on file   Highest education level: Not on file  Occupational History   Not on file  Social Needs   Financial resource strain: Not on file   Food insecurity    Worry: Not on file    Inability: Not on file   Transportation needs    Medical: Not on file    Non-medical: Not on file  Tobacco Use   Smoking status: Never Smoker   Smokeless tobacco: Never Used  Substance and Sexual Activity   Alcohol use: Yes    Alcohol/week: 5.0 - 7.0 standard drinks    Types: 5 - 7 Standard drinks or equivalent per week   Drug use: No   Sexual activity: Yes    Birth control/protection: Post-menopausal  Lifestyle   Physical activity    Days per week:  Not on file    Minutes per session: Not on file   Stress: Not on file  Relationships   Social connections    Talks on phone: Not on file    Gets together: Not on file    Attends religious service: Not on file    Active member of club or organization: Not on file    Attends meetings of clubs or organizations: Not on file    Relationship status: Not on file   Intimate partner violence    Fear of current or ex partner: Not on file    Emotionally abused: Not on file    Physically abused: Not on file    Forced sexual activity: Not on file  Other Topics Concern   Not on file  Social History Narrative   Not on file    Medications:   Current  Outpatient Medications on File Prior to Visit  Medication Sig Dispense Refill   aspirin 81 MG EC tablet Take 1 tablet (81 mg total) by mouth daily. 30 tablet 0   atorvastatin (LIPITOR) 40 MG tablet Take 1 tablet (40 mg total) by mouth at bedtime. 90 tablet 1   bimatoprost (LUMIGAN) 0.03 % ophthalmic drops Place 1 drop into both eyes at bedtime.      Cholecalciferol (VITAMIN D3) 2000 UNITS TABS Take 1 tablet by mouth daily.      clopidogrel (PLAVIX) 75 MG tablet Take 1 tablet (75 mg total) by mouth daily. 21 tablet 0   hydrochlorothiazide (MICROZIDE) 12.5 MG capsule Take 1 capsule (12.5 mg total) by mouth daily.  1   olmesartan (BENICAR) 20 MG tablet Take 1 tablet (20 mg total) by mouth daily.     pregabalin (LYRICA) 75 MG capsule Take 1 capsule (75 mg total) by mouth 2 (two) times daily. (Patient taking differently: Take 75 mg by mouth daily. ) 60 capsule 0   No current facility-administered medications on file prior to visit.     Allergies:   Allergies  Allergen Reactions   Amlodipine     edema   Simvastatin     myalgias   Erythromycin     Physical Exam General: Frail elderly Caucasian lady, seated, in no evident distress Head: head normocephalic and atraumatic.  Neck: supple with no carotid or supraclavicular bruits Cardiovascular: regular rate and rhythm, no murmurs Musculoskeletal: no deformity Skin:  no rash/petichiae Vascular:  Normal pulses all extremities Vitals:   07/11/19 1426  BP: 116/79  Pulse: 71  Temp: 98.6 F (37 C)   Neurologic Exam Mental Status: Awake and fully alert. Oriented to place and time. Recent and remote memory intact. Attention span, concentration and fund of knowledge appropriate. Mood and affect appropriate.  Cranial Nerves: Fundoscopic exam reveals sharp disc margins. Pupils equal, briskly reactive to light. Extraocular movements full without nystagmus. Visual fields full to confrontation. Hearing intact. Facial sensation intact.  Face, tongue, palate moves normally and symmetrically.  Motor: Normal bulk and tone. Normal strength in all tested extremity muscles. Sensory.: intact to touch ,pinprick .position and vibratory sensation.  Coordination: Rapid alternating movements normal in all extremities. Finger-to-nose and heel-to-shin performed accurately bilaterally. Gait and Station: Arises from chair without difficulty. Stance is normal. Gait demonstrates normal stride length and balance . Able to heel, toe and tandem walk with slight difficulty.  Reflexes: 1+ and symmetric. Toes downgoing.   NIHSS  0 Modified Rankin  1   ASSESSMENT: 26 lady with left thalamic infarct in July 2020 secondary to small vessel  disease.  Vascular risk factors of hypertension and hyperlipidemia only.  She has some mild residual poststroke paresthesias which are not bothersome and likely to improve with time    PLAN: I had a long d/w patient about her recent lacunar stroke,post stroke paresthesias, risk for recurrent stroke/TIAs, personally independently reviewed imaging studies and stroke evaluation results and answered questions.Are improving and are not bothersome enough to justify a trial of medications at the present time.Her paresthesias.  Her paresthesias are improving and are not bothersome enough at the present time to justify trial of medications.  I expect this to improve gradually over the next few months.  Continue aspirin 81 mg daily  for secondary stroke prevention and maintain strict control of hypertension with blood pressure goal below 130/90, diabetes with hemoglobin A1c goal below 6.5% and lipids with LDL cholesterol goal below 70 mg/dL. I also advised the patient to eat a healthy diet with plenty of whole grains, cereals, fruits and vegetables, exercise regularly and maintain ideal body weight Followup in the future with my nurse practitioner Janett Billow in 3 months or call earlier if necessary. Greater than 50% of time during this  25 minute visit was spent on counseling,explanation of diagnosis lacunar stroke, post stroke paresthesias, planning of further management, discussion with patient and family and coordination of care Antony Contras, MD  [facility] [address] [place], [place] [address]  Phone [phone] Fax [phone] Note: This document was prepared with digital dictation and possible smart phrase technology. Any transcriptional errors that result from this process are unintentional

## 2019-07-21 DIAGNOSIS — H401132 Primary open-angle glaucoma, bilateral, moderate stage: Secondary | ICD-10-CM | POA: Diagnosis not present

## 2019-07-26 ENCOUNTER — Other Ambulatory Visit: Payer: Self-pay

## 2019-07-26 ENCOUNTER — Other Ambulatory Visit: Payer: Self-pay | Admitting: Family Medicine

## 2019-07-26 DIAGNOSIS — M797 Fibromyalgia: Secondary | ICD-10-CM

## 2019-07-26 DIAGNOSIS — I10 Essential (primary) hypertension: Secondary | ICD-10-CM

## 2019-07-26 MED ORDER — PREGABALIN 75 MG PO CAPS: 75.0000 mg | ORAL_CAPSULE | Freq: Two times a day (BID) | ORAL | 2 refills | Status: DC

## 2019-08-01 ENCOUNTER — Other Ambulatory Visit: Payer: Self-pay | Admitting: Family Medicine

## 2019-08-01 ENCOUNTER — Telehealth: Payer: Self-pay | Admitting: Family Medicine

## 2019-08-01 DIAGNOSIS — M797 Fibromyalgia: Secondary | ICD-10-CM

## 2019-08-01 MED ORDER — PREGABALIN 75 MG PO CAPS: 75.0000 mg | ORAL_CAPSULE | Freq: Two times a day (BID) | ORAL | 2 refills | Status: DC

## 2019-08-01 NOTE — Telephone Encounter (Signed)
Patient called stating that she needs a refill of her Rudell Cobb to be sent to [facility]. I advised patient that it looks like this refill was approved last week but that it was not e prescribed but rather print class. Can we make sure this is accurate so that patient can get her refill?

## 2019-08-01 NOTE — Telephone Encounter (Signed)
6. Neuropathic Pain & Fibromyalgia Pain - recently released from Rheumatology - Continue management on Meloxicam and Lyrica. - Encouraged adequate hydration and regular exercise to help alleviate pain.

## 2019-08-01 NOTE — Telephone Encounter (Signed)
The RX was printed.  Please call pt and advise that she will need to come pick up RX and send to pharmacy herself.  We can not do a new RX for this medication via [facility]cripts.  Charyl Bigger, CMA

## 2019-08-01 NOTE — Telephone Encounter (Signed)
Patient states she did not pick up printed Lyrica Rx last week and is almost out of this med. Patient is requesting a we resend prescription electronically this time.

## 2019-08-01 NOTE — Addendum Note (Signed)
Addended by: Fonnie Mu on: 08/01/2019 04:33 PM   Modules accepted: Orders

## 2019-08-02 ENCOUNTER — Other Ambulatory Visit: Payer: Self-pay | Admitting: Orthopedic Surgery

## 2019-08-02 DIAGNOSIS — M25531 Pain in right wrist: Secondary | ICD-10-CM

## 2019-08-18 DIAGNOSIS — M1711 Unilateral primary osteoarthritis, right knee: Secondary | ICD-10-CM | POA: Diagnosis not present

## 2019-08-22 ENCOUNTER — Other Ambulatory Visit: Payer: BC Managed Care – PPO

## 2019-08-24 DIAGNOSIS — H401132 Primary open-angle glaucoma, bilateral, moderate stage: Secondary | ICD-10-CM | POA: Diagnosis not present

## 2019-08-24 DIAGNOSIS — D225 Melanocytic nevi of trunk: Secondary | ICD-10-CM | POA: Diagnosis not present

## 2019-08-24 DIAGNOSIS — Z1283 Encounter for screening for malignant neoplasm of skin: Secondary | ICD-10-CM | POA: Diagnosis not present

## 2019-09-02 ENCOUNTER — Other Ambulatory Visit: Payer: Self-pay

## 2019-09-02 ENCOUNTER — Ambulatory Visit (INDEPENDENT_AMBULATORY_CARE_PROVIDER_SITE_OTHER): Payer: BC Managed Care – PPO

## 2019-09-02 DIAGNOSIS — Z23 Encounter for immunization: Secondary | ICD-10-CM

## 2019-09-02 NOTE — Progress Notes (Signed)
Pt here for influenza vaccine.  Screening questionnaire reviewed, VIS provided to patient, and any/all patient questions answered.  T. Coral Soler, CMA  

## 2019-10-03 ENCOUNTER — Other Ambulatory Visit: Payer: Self-pay | Admitting: Family Medicine

## 2019-10-03 DIAGNOSIS — I10 Essential (primary) hypertension: Secondary | ICD-10-CM

## 2019-10-04 ENCOUNTER — Other Ambulatory Visit: Payer: Self-pay

## 2019-10-04 ENCOUNTER — Encounter: Payer: Self-pay | Admitting: Adult Health

## 2019-10-04 ENCOUNTER — Ambulatory Visit: Payer: BC Managed Care – PPO | Admitting: Adult Health

## 2019-10-04 VITALS — BP 119/76 | HR 69 | Temp 97.5°F | Ht 64.0 in | Wt 151.4 lb

## 2019-10-04 DIAGNOSIS — I639 Cerebral infarction, unspecified: Secondary | ICD-10-CM

## 2019-10-04 DIAGNOSIS — R202 Paresthesia of skin: Secondary | ICD-10-CM | POA: Diagnosis not present

## 2019-10-04 DIAGNOSIS — E785 Hyperlipidemia, unspecified: Secondary | ICD-10-CM | POA: Diagnosis not present

## 2019-10-04 DIAGNOSIS — I6381 Other cerebral infarction due to occlusion or stenosis of small artery: Secondary | ICD-10-CM

## 2019-10-04 DIAGNOSIS — I10 Essential (primary) hypertension: Secondary | ICD-10-CM | POA: Diagnosis not present

## 2019-10-04 NOTE — Progress Notes (Signed)
I agree with the above plan 

## 2019-10-04 NOTE — Progress Notes (Signed)
[facility] [address] [place]. [place] [address] [phone]       OFFICE FOLLOW-UP NOTE  Ms. Nechy Dominquez Date of Birth:  1946-01-18 Medical Record Number:  [id number]   Chief Complaint  Patient presents with  . Follow-up    3 mon f/u. Alone. Rm 9. Patient mentioned that her lips and fingers on her right hand are still numb. She stated her fingers hurts more when its cold outside.       HPI:   Initial visit 07/11/2019 Dr. Leonie Man: Ms. Gunnison is a pleasant 73 year old Caucasian lady seen today for initial office follow-up visit following hospital admission for stroke in July 2020.  History is obtained from the patient and review of electronic medical records and I personally reviewed imaging films in PACS.  She is a pleasant 73 year old lady with history of osteoporosis, hypertension, hyperlipidemia and fibromyalgia.  She presented with sudden onset of right face, hand and foot paresthesias and tingling and numbness on 06/07/2019.  She did not think much about it and actually went to sleep that night.  When she woke up she noticed the numbness involving the right face as well as leg which was worse.  She came to the emergency room and neurology was consulted.  She had NIH stroke scale of 1 for sensory disturbance on the right side.  CT scan of the head was unremarkable.  MRI scan showed a dorsolateral left thalamic infarct.  MRI of the brain and neck both did not show any significant large vessel stenosis or occlusion.  Echocardiogram showed normal ejection fraction.  LDL cholesterol was borderline at 77 mg percent and hemoglobin A1c was 6.1.  Patient was taking Lipitor 20 mg daily and dose was increased to 40 mg.  She was placed on dual antiplatelet therapy aspirin and Plavix for 3 weeks and has since stopped Plavix and is on aspirin alone now.  She states she is noticed improvement in right leg numbness and and numbness is improved however she still has some tingling  and numbness around her right lips as well as fingers of the right hand.  She states she is tolerating aspirin well without bruising or bleeding.  Her blood pressures well controlled today it is 116/79.  She recently had some lab work done by her primary physician which included BMP and CBC which were unremarkable.  She has another appointment in 4 months to have annual physical exam as well as lipid profile checked.  She has no new complaints today.  Update 10/04/2019: Ms. Iannacone is a 73 year old female who is being seen today for stroke follow-up.  Residual deficits right-sided numbness and fingertip numbness but denies worsening.  She does occasionally have increased pain at fingertips especially with colder weather.  At times this can interfere with daily functioning as she is right-handed.  She is currently on Lyrica for fibromyalgia prescribed at 75 mg twice daily but currently only takes daily.  Continues on aspirin and atorvastatin for secondary stroke prevention without side effects.  Blood pressure today satisfactory 119/76.  She has annual follow-up with PCP in 2 weeks and will obtain lab work at that time.  No further concerns at this time.     ROS:   14 system review of systems is positive for numbness, tingling only and all other systems negative  PMH:  Past Medical History:  Diagnosis Date  . Fibromyalgia   . Hyperlipidemia   . Hypertension   . Osteoporosis     Social  History:  Social History   Socioeconomic History  . Marital status: Married    Spouse name: Not on file  . Number of children: Not on file  . Years of education: Not on file  . Highest education level: Not on file  Occupational History  . Not on file  Social Needs  . Financial resource strain: Not on file  . Food insecurity    Worry: Not on file    Inability: Not on file  . Transportation needs    Medical: Not on file    Non-medical: Not on file  Tobacco Use  . Smoking status: Never Smoker  .  Smokeless tobacco: Never Used  Substance and Sexual Activity  . Alcohol use: Yes    Alcohol/week: 5.0 - 7.0 standard drinks    Types: 5 - 7 Standard drinks or equivalent per week  . Drug use: No  . Sexual activity: Yes    Birth control/protection: Post-menopausal  Lifestyle  . Physical activity    Days per week: Not on file    Minutes per session: Not on file  . Stress: Not on file  Relationships  . Social Herbalist on phone: Not on file    Gets together: Not on file    Attends religious service: Not on file    Active member of club or organization: Not on file    Attends meetings of clubs or organizations: Not on file    Relationship status: Not on file  . Intimate partner violence    Fear of current or ex partner: Not on file    Emotionally abused: Not on file    Physically abused: Not on file    Forced sexual activity: Not on file  Other Topics Concern  . Not on file  Social History Narrative  . Not on file    Medications:   Current Outpatient Medications on File Prior to Visit  Medication Sig Dispense Refill  . aspirin 81 MG EC tablet Take 1 tablet (81 mg total) by mouth daily. 30 tablet 0  . atorvastatin (LIPITOR) 40 MG tablet Take 1 tablet (40 mg total) by mouth at bedtime. 90 tablet 1  . bimatoprost (LUMIGAN) 0.03 % ophthalmic drops Place 1 drop into both eyes at bedtime.     . Cholecalciferol (VITAMIN D3) 2000 UNITS TABS Take 1 tablet by mouth daily.     . hydrochlorothiazide (MICROZIDE) 12.5 MG capsule TAKE 1 CAPSULE DAILY 90 capsule 3  . olmesartan (BENICAR) 20 MG tablet TAKE 1 TABLET DAILY 90 tablet 1  . pregabalin (LYRICA) 75 MG capsule Take 1 capsule (75 mg total) by mouth 2 (two) times daily. 60 capsule 2   No current facility-administered medications on file prior to visit.     Allergies:   Allergies  Allergen Reactions  . Amlodipine     edema  . Simvastatin     myalgias  . Erythromycin     Physical Exam  Today's Vitals   10/04/19  1304  BP: 119/76  Pulse: 69  Temp: (!) 97.5 F (36.4 C)  TempSrc: Oral  Weight: 151 lb 6.4 oz (68.7 kg)  Height: 5\' 4"  (1.626 m)   Body mass index is 25.99 kg/m.   General: Pleasant elderly Caucasian lady, seated, in no evident distress Head: head normocephalic and atraumatic.  Neck: supple with no carotid or supraclavicular bruits Cardiovascular: regular rate and rhythm, no murmurs Musculoskeletal: no deformity Skin:  no rash/petichiae Vascular:  Normal pulses all  extremities  Neurologic Exam Mental Status: Awake and fully alert. Oriented to place and time. Recent and remote memory intact. Attention span, concentration and fund of knowledge appropriate. Mood and affect appropriate.  Cranial Nerves: Pupils equal, briskly reactive to light. Extraocular movements full without nystagmus. Visual fields full to confrontation. Hearing intact. Facial sensation intact. Face, tongue, palate moves normally and symmetrically with subjective right lip numbness.  Motor: Normal bulk and tone. Normal strength in all tested extremity muscles. Sensory.: intact to touch ,pinprick .position and vibratory sensation.  Coordination: Rapid alternating movements normal in all extremities. Finger-to-nose and heel-to-shin performed accurately bilaterally. Gait and Station: Arises from chair without difficulty. Stance is normal. Gait demonstrates normal stride length and balance .  Reflexes: 1+ and symmetric. Toes downgoing.      ASSESSMENT: 20 lady with left thalamic infarct in July 2020 secondary to small vessel disease.  Vascular risk factors of hypertension and hyperlipidemia only.  She has some mild residual poststroke paresthesias located right sided lips and right fingertips which has been stable since prior visit but does experience occasional pain worsened with cold weather    PLAN: -Continue aspirin 81 mg daily and atorvastatin 40 mg daily for secondary stroke prevention -Recommend trialing  recommended dosage of pregabalin 75 mg twice daily for potential benefit with post stroke paresthesias.  Currently prescribed by PCP for fibromyalgia and currently only taking once daily.  She was also provided additional information regarding exercises at home for potential benefit -Continue to follow with PCP for HLD and HTN management -has annual follow-up visit in 2 weeks with lab work being obtained prior to visit -maintain strict control of hypertension with blood pressure goal below 130/90, diabetes with hemoglobin A1c goal below 6.5% and lipids with LDL cholesterol goal below 70 mg/dL. I also advised the patient to eat a healthy diet with plenty of whole grains, cereals, fruits and vegetables, exercise regularly and maintain ideal body weight   Follow-up in 6 months or call earlier if needed  Greater than 50% of time during this 25 minute visit was spent on counseling,explanation of diagnosis lacunar stroke, post stroke paresthesias and potential treatment options, planning of further management, and discussion regarding importance of diet and exercise and answered all questions to patient satisfaction  Frann Rider, AGNP-BC  [facility] [address] [place], [place] [address]  Phone [phone] Fax [phone] Note: This document was prepared with digital dictation and possible smart phrase technology. Any transcriptional errors that result from this process are unintentional.

## 2019-10-04 NOTE — Patient Instructions (Signed)
Continue aspirin 81 mg daily  and Lipitor  for secondary stroke prevention  Continue to follow up with PCP regarding cholesterol and blood pressure management -obtain lab work including cholesterol levels to ensure satisfactory management  Take prescribed dosage of pregabalin (Lyrica) 75 mg twice daily for potential benefit of numbness/tingling pain  Continue to monitor blood pressure at home  Maintain strict control of hypertension with blood pressure goal below 130/90, diabetes with hemoglobin A1c goal below 6.5% and cholesterol with LDL cholesterol (bad cholesterol) goal below 70 mg/dL. I also advised the patient to eat a healthy diet with plenty of whole grains, cereals, fruits and vegetables, exercise regularly and maintain ideal body weight.  Followup in the future with me in 6 months or call earlier if needed       Thank you for coming to see [place] at [facility]. I hope we have been able to provide you high quality care today.  You may receive a patient satisfaction survey over the next few weeks. We would appreciate your feedback and comments so that we may continue to improve ourselves and the health of our patients.

## 2019-10-17 ENCOUNTER — Other Ambulatory Visit: Payer: BC Managed Care – PPO

## 2019-10-17 ENCOUNTER — Other Ambulatory Visit: Payer: Self-pay

## 2019-10-17 DIAGNOSIS — R7303 Prediabetes: Secondary | ICD-10-CM | POA: Diagnosis not present

## 2019-10-17 DIAGNOSIS — E785 Hyperlipidemia, unspecified: Secondary | ICD-10-CM

## 2019-10-17 DIAGNOSIS — I10 Essential (primary) hypertension: Secondary | ICD-10-CM

## 2019-10-18 LAB — COMPREHENSIVE METABOLIC PANEL
ALT: 27 IU/L (ref 0–32)
AST: 22 IU/L (ref 0–40)
Albumin/Globulin Ratio: 2 (ref 1.2–2.2)
Albumin: 4.5 g/dL (ref 3.7–4.7)
Alkaline Phosphatase: 81 IU/L (ref 39–117)
BUN/Creatinine Ratio: 19 (ref 12–28)
BUN: 19 mg/dL (ref 8–27)
Bilirubin Total: 0.6 mg/dL (ref 0.0–1.2)
CO2: 26 mmol/L (ref 20–29)
Calcium: 9.7 mg/dL (ref 8.7–10.3)
Chloride: 104 mmol/L (ref 96–106)
Creatinine, Ser: 0.98 mg/dL (ref 0.57–1.00)
GFR calc Af Amer: 67 mL/min/{1.73_m2} (ref >59)
GFR calc non Af Amer: 58 mL/min/{1.73_m2} — ABNORMAL LOW (ref >59)
Globulin, Total: 2.3 g/dL (ref 1.5–4.5)
Glucose: 92 mg/dL (ref 65–99)
Potassium: 4.1 mmol/L (ref 3.5–5.2)
Sodium: 143 mmol/L (ref 134–144)
Total Protein: 6.8 g/dL (ref 6.0–8.5)

## 2019-10-18 LAB — HEMOGLOBIN A1C
Est. average glucose Bld gHb Est-mCnc: 126 mg/dL
Hgb A1c MFr Bld: 6 % — ABNORMAL HIGH (ref 4.8–5.6)

## 2019-10-18 LAB — LIPID PANEL
Chol/HDL Ratio: 2.3 ratio (ref 0.0–4.4)
Cholesterol, Total: 165 mg/dL (ref 100–199)
HDL: 71 mg/dL (ref >39)
LDL Chol Calc (NIH): 73 mg/dL (ref 0–99)
Triglycerides: 121 mg/dL (ref 0–149)
VLDL Cholesterol Cal: 21 mg/dL (ref 5–40)

## 2019-10-19 ENCOUNTER — Other Ambulatory Visit: Payer: Self-pay | Admitting: Family Medicine

## 2019-10-19 DIAGNOSIS — Z1231 Encounter for screening mammogram for malignant neoplasm of breast: Secondary | ICD-10-CM

## 2019-10-20 ENCOUNTER — Ambulatory Visit: Payer: BC Managed Care – PPO | Admitting: Family Medicine

## 2019-10-21 ENCOUNTER — Ambulatory Visit (INDEPENDENT_AMBULATORY_CARE_PROVIDER_SITE_OTHER): Payer: BC Managed Care – PPO | Admitting: Family Medicine

## 2019-10-21 ENCOUNTER — Other Ambulatory Visit: Payer: Self-pay

## 2019-10-21 ENCOUNTER — Encounter: Payer: Self-pay | Admitting: Family Medicine

## 2019-10-21 VITALS — Ht 64.0 in | Wt 149.0 lb

## 2019-10-21 DIAGNOSIS — E785 Hyperlipidemia, unspecified: Secondary | ICD-10-CM | POA: Diagnosis not present

## 2019-10-21 DIAGNOSIS — I10 Essential (primary) hypertension: Secondary | ICD-10-CM

## 2019-10-21 DIAGNOSIS — R7303 Prediabetes: Secondary | ICD-10-CM

## 2019-10-21 DIAGNOSIS — M792 Neuralgia and neuritis, unspecified: Secondary | ICD-10-CM

## 2019-10-21 DIAGNOSIS — I639 Cerebral infarction, unspecified: Secondary | ICD-10-CM

## 2019-10-21 DIAGNOSIS — I6381 Other cerebral infarction due to occlusion or stenosis of small artery: Secondary | ICD-10-CM

## 2019-10-21 MED ORDER — ATORVASTATIN CALCIUM 80 MG PO TABS: 80.0000 mg | ORAL_TABLET | Freq: Every day | ORAL | 1 refills | Status: DC

## 2019-10-21 NOTE — Progress Notes (Signed)
Virtual / live video office visit note for [employer], D.O- Primary Care Physician at [facility]   I connected with current patient today and beyond visually recognizing the correct individual, I verified that I am speaking with the correct person using two identifiers.  . Location of the patient: Home . Location of the provider: Office Only the patient (+/- their family members at pt's discretion) and myself were participating in the encounter    - This visit type was conducted due to national recommendations for restrictions regarding the COVID-19 Pandemic (e.g. social distancing) in an effort to limit this patient's exposure and mitigate transmission in our community.  This format is felt to be most appropriate for this patient at this time.   - The patient did have access to video technology today  - No physical exam could be performed with this format, beyond that communicated to [place] by the patient/ family members as noted.   - Additionally my office staff/ schedulers discussed with the patient that there may be a monetary charge related to this service, depending on patient's medical insurance.   The patient expressed understanding, and agreed to proceed.      History of Present Illness: I, Toni Amend, am serving as scribe for Dr. [name].  Per patient, Thanksgiving was good, quiet, "just my husband and I."  She is leaving on Sunday to go home to [place] to visit family for a week.  Says everyone will be wearing their masks.  Notes feeling good overall.  - Numbness / Neuropathy since Stroke Says "the only thing that bothers me" is that her lips are still numb, and her fingers on her right hand remain numb.  Notes "the cold has made them hurt so bad."  She continues to follow up with specialist as recommended.  Notes she was told to take two Lyrica daily, one in the morning, and one at night.  Says her lips "feel like they've got Botox in them or  something, not that I've had Botox."  Patient notes she no longer follows up with rheumatology.  "[Rheumatologist] said I was fine to be dismissed unless there were other concerns," and that Lyrica cold be managed by PCP.  - Lifestyle & Exercise Notes she's been trying to exercise and move more lately.  Her husband had gastric bypass surgery a month ago, which affected her eating habits as well.  Notes "he ordered a really good elliptical, so I've been trying to get on that."  Notes that her husband has been walking around the neighborhood to improve his fitness, and will be using the elliptical soon.  Notes her husband has always struggled with his weight.  States she will be cooking differently for her husband and her eating habits will also change.  Notes she does drink a lot of water.  HPI:  Hyperlipidemia:  Notes "I thought my neurologist had already increased my dose of Lipitor." States she is now taking 40 mg of Lipitor.  She was managed on 20 prior to the stroke. She has been managed on the 40 for the past four months. States she's tolerating the changed dose of statin well.  73 y.o. female here for cholesterol follow-up.   - Patient reports good compliance with treatment plan of:  medication and/ or lifestyle management.    - Patient denies any acute concerns or problems with management plan   - She denies new onset of: myalgias, arthralgias, increased fatigue more  than normal, chest pains, exercise intolerance, shortness of breath, dizziness, visual changes, headache, lower extremity swelling or claudication.   Most recent cholesterol panel was:  Lab Results  Component Value Date   CHOL 165 10/17/2019   HDL 71 10/17/2019   LDLCALC 73 10/17/2019   LDLDIRECT 104 (H) 12/21/2018   TRIG 121 10/17/2019   CHOLHDL 2.3 10/17/2019   Hepatic Function Latest Ref Rng & Units 10/17/2019 06/08/2019 12/21/2018  Total Protein 6.0 - 8.5 g/dL 6.8 7.3 7.1  Albumin 3.7 - 4.7 g/dL 4.5 4.2  4.6  AST 0 - 40 IU/L _0 ALT 0 - 32 IU/L 27 30 35(H)  Alk Phosphatase 39 - 117 IU/L 81 56 67  Total Bilirubin 0.0 - 1.2 mg/dL 0.6 0.7 0.5  Bilirubin, Direct 0.0 - 0.3 mg/dL - - -   HPI:  Hypertension:  -  Her blood pressure at home has been running: 119/77.  "It's been good."  - Patient reports good compliance with medication and/or lifestyle modification  - Her denies acute concerns or problems related to treatment plan  - She denies new onset of: chest pain, exercise intolerance, shortness of breath, dizziness, visual changes, headache, lower extremity swelling or claudication.   Last 3 blood pressure readings in our office are as follows: BP Readings from Last 3 Encounters:  10/04/19 119/76  07/11/19 116/79  06/28/19 113/80   Filed Weights   10/21/19 0927  Weight: 149 lb (67.6 kg)    Depression screen [facility] 2/9 10/21/2019 06/28/2019 12/21/2018 10/12/2018 06/07/2018  Decreased Interest 0 0 0 0 0  Down, Depressed, Hopeless 0 0 0 0 0  PHQ - 2 Score 0 0 0 0 0  Altered sleeping 0 _1 -  Tired, decreased energy _2 -  Change in appetite 0 0 0 0 -  Feeling bad or failure about yourself  0 0 0 0 -  Trouble concentrating 0 0 0 0 -  Moving slowly or fidgety/restless 0 0 0 0 -  Suicidal thoughts 0 0 0 0 -  PHQ-9 Score _3 -  Difficult doing work/chores Not difficult at all Not difficult at all Not difficult at all Not difficult at all -    GAD 7 : Generalized Anxiety Score 06/28/2019  Nervous, Anxious, on Edge 0  Control/stop worrying 0  Worry too much - different things 0  Trouble relaxing 0  Restless 0  Easily annoyed or irritable 0  Afraid - awful might happen 0  Total GAD 7 Score 0  Anxiety Difficulty Not difficult at all     Impression and Recommendations:    1. Left thalamic infarction ([place])- new onset   2. Essential hypertension, benign   3. Hyperlipidemia, unspecified hyperlipidemia type   4. Prediabetes- newly Dx Feb 2020   5. Neuropathic  pain-due to fibromyalgia and CVA     - Last OV 06/28/2019, hospital follow-up after acute ischemic stroke. - Statin dose increased last OV for goal LDL less than 70.  Hyperlipidemia with Target LDL less than 70 - h/o stroke - Lengthy conversation held with patient today regarding treatment plan.  - Discussed that in patients with history of cardiovascular events such as stroke, goal LDL is set at under 70.  Reviewed historical LDL with patient today, at 77 four months ago, 98 ten months ago, 88 one year ago, and 156 two years ago.  Education provided and all questions answered.  - LDL = 73 last  check, stable. - HDL = 71, up from 64 prior. - Triglycerides = 121, up from 115 prior.  - For better control of LDL, recommended increasing dose of statin today, to 80 mg. - To continue to reduce incidence of side-effect, encouraged patient to hydrate adequately and continue to engage in frequent physical activity / a formal exercise routine.  - Patient tolerated increase in statin from 20 to 40 mg with ALT & AST WNL. - Will continue to monitor liver enzymes.  - Prudent dietary changes such as low saturated & trans fat diets for hyperlipidemia and low carb diets for hypertriglyceridemia discussed with patient.    Encouraged patient to follow AHA guidelines for regular exercise and also engage in weight loss if BMI above 25.   Educational handouts provided at patient's desire and/ or told to look online at the [employer] website for further information.  We will continue to monitor.  Neuropathic Pain / Numbness due to fibromyalgia and CVA - Told patient to take her Lyrica twice daily as advised, once in AM, and once in PM - Encouraged patient to report all neuropathic symptoms to neurologist. - Told patient to continue to follow-up with specialist as scheduled/recommended. - Will continue to monitor and modify treatment plan PRN.  Essential Hypertension - Stable at this time.  - Blood pressure at goal. - Patient will continue current treatment regimen.  - Counseled patient on pathophysiology of disease and discussed various treatment options, which always includes dietary and lifestyle modification as first line.   - Lifestyle changes such as dash and heart healthy diets and engaging in a regular exercise program discussed extensively with patient.   - Ambulatory blood pressure monitoring encouraged at least 3 times weekly.  Keep log and bring in every office visit.  Reminded patient that if they ever feel poorly in any way, to check their blood pressure and pulse.  - We will continue to monitor.  Prediabetes - Last A1c measured at 6.0, down from 6.1 four months ago.  - Counseled patient on prevention of diabetes and discussed dietary and lifestyle modification as first line.   - Importance of low carb, heart-healthy diet discussed with patient in addition to regular aerobic exercise of 16mn 5d/week or more.   BMI Counseling - Body mass index is 25.58 kg/m Explained to patient what BMI refers to, and what it means medically.    Told patient to think about it as a "medical risk stratification measurement" and how increasing BMI is associated with increasing risk/ or worsening state of various diseases such as hypertension, hyperlipidemia, diabetes, premature OA, depression etc.  American Heart Association guidelines for healthy diet, basically Mediterranean diet, and exercise guidelines of 30 minutes 5 days per week or more discussed in detail.  Health counseling performed.  All questions answered.  Lifestyle & Preventative Health Maintenance - Advised patient to continue working toward exercising to improve overall mental, physical, and emotional health.    - Reviewed the "spokes of the wheel" of mood and health management.  Stressed the importance of ongoing prudent habits, including regular exercise, appropriate sleep hygiene, healthful dietary habits, and  prayer/meditation to relax.  - Encouraged patient to engage in daily physical activity, especially a formal exercise routine.  Recommended that the patient eventually strive for at least 150 minutes of moderate cardiovascular activity per week according to guidelines established by the [facility]   - Healthy dietary habits encouraged, including low-carb, and high amounts of lean protein in diet.   -  Patient should also consume adequate amounts of water.  Recommendations - Re-check FLP and ALT in 3-4 months, with DOXY 3-4 days later. - Discussed need for up to date screenings and immunizations. - Lengthy conversation held with patient today regarding recommendations. - If desired, patient may come in for CPE in 4 months.   - Novel Covid -19 counseling done; all questions were answered.   - Current CDC / federal and [place] guidelines reviewed with patient  - Reminded pt of extreme importance of social distancing; wearing a mask when out in public; insensate handwashing and cleaning of surfaces, avoiding unnecessary trips for shopping and avoiding ALL but emergency appts etc. - Told patient to be prepared, not scared; and be smart for the sake of others - Patient will call with any additional concerns   - As part of my medical decision making, I reviewed the following data within the [place] History obtained from pt /family, CMA notes reviewed and incorporated if applicable, Labs reviewed, Radiograph/ tests reviewed if applicable and OV notes from prior OV's with me, as well as other specialists she/he has seen since seeing me last, were all reviewed and used in my medical decision making process today.   - Additionally, discussion had with patient regarding txmnt plan, their biases about that plan etc were used in my medical decision making today.   - The patient agreed with the plan and demonstrated an understanding of the instructions.   No barriers to understanding were  identified.      Return for lab only FLP and ALT visit in 3-4 months, with OV (DOXY) 3-4 days later..     Meds ordered this encounter  Medications  . atorvastatin (LIPITOR) 80 MG tablet    Sig: Take 1 tablet (80 mg total) by mouth at bedtime.    Dispense:  90 tablet    Refill:  1    Medications Discontinued During This Encounter  Medication Reason  . atorvastatin (LIPITOR) 40 MG tablet Reorder      Note:  This note was prepared with assistance of Dragon voice recognition software. Occasional wrong-word or sound-a-like substitutions may have occurred due to the inherent limitations of voice recognition software.  This document serves as a record of services personally performed by [name], DO. It was created on her behalf by Toni Amend, a trained medical scribe. The creation of this record is based on the scribe's personal observations and the provider's statements to them.   This case required medical decision making of at least moderate complexity. The above documentation has been reviewed to be accurate and was completed by Marjory Sneddon, D.O.       Patient Care Team    Relationship Specialty Notifications Start End  [name], DO PCP - General Family Medicine  10/11/18   Marchia Bond, MD Consulting Physician Orthopedic Surgery  10/12/18   Levy Sjogren, MD Referring Physician Dermatology  10/12/18   Wallene Huh, DPM Consulting Physician Podiatry  10/12/18   Juluis Rainier  Optometry  10/12/18   Garvin Fila, MD Consulting Physician Neurology  10/21/19     -Vitals obtained; medications/ allergies reconciled;  personal medical, social, Sx etc.histories were updated by CMA, reviewed by me and are reflected in chart  Patient Active Problem List   Diagnosis Date Noted  . Hyperlipidemia with target LDL less than 130 11/30/2009    Priority: High  . Essential hypertension, benign 11/30/2009    Priority:  High  . Neuropathic  pain-due to fibromyalgia, sees Dr. Lenna Gilford of rheumatology 10/12/2018    Priority: Medium  . DEPRESSIVE DISORDER 11/30/2009    Priority: Medium  . Osteoporosis 11/30/2009    Priority: Medium  . Vitamin D deficiency 10/12/2018    Priority: Low  . Chronic foot pain, right-and ankle; sees Dr. Paulla Dolly of podiatry  10/12/2018    Priority: Low  . Long-term current use of opiate analgesic 04/13/2018    Priority: Low  . Fibromyalgia muscle pain 07/28/2012    Priority: Low  . Anticoagulated 06/28/2019  . Numbness and tingling of right side of face 06/08/2019  . Left thalamic infarction ([place]) 06/08/2019  . Prediabetes- newly Dx Feb 2020 12/29/2018  . History of adjustment disorder 10/12/2018  . Allergic contact dermatitis due to plants, except food 03/15/2018  . Low back pain at multiple sites 01/21/2018  . Estrogen deficiency 09/16/2016  . Primary osteoarthritis of right shoulder 04/17/2015  . Nonspecific abnormal electrocardiogram (ECG) (EKG) 06/28/2014  . Middle insomnia 06/28/2014  . Cervical radiculitis 07/06/2013  . Bradycardia 06/23/2013  . Routine general medical examination at a health care facility 04/19/2012  . Allergic rhinitis due to other allergen 03/20/2010     Current Meds  Medication Sig  . aspirin 81 MG EC tablet Take 1 tablet (81 mg total) by mouth daily.  Marland Kitchen atorvastatin (LIPITOR) 80 MG tablet Take 1 tablet (80 mg total) by mouth at bedtime.  . bimatoprost (LUMIGAN) 0.03 % ophthalmic drops Place 1 drop into both eyes at bedtime.   . Cholecalciferol (VITAMIN D3) 2000 UNITS TABS Take 1 tablet by mouth daily.   . hydrochlorothiazide (MICROZIDE) 12.5 MG capsule TAKE 1 CAPSULE DAILY  . olmesartan (BENICAR) 20 MG tablet TAKE 1 TABLET DAILY  . pregabalin (LYRICA) 75 MG capsule Take 1 capsule (75 mg total) by mouth 2 (two) times daily.  . [DISCONTINUED] atorvastatin (LIPITOR) 40 MG tablet Take 1 tablet (40 mg total) by mouth at bedtime.     Allergies  Allergen Reactions  .  Amlodipine     edema  . Simvastatin     myalgias  . Erythromycin      ROS:  See above HPI for pertinent positives and negatives   Objective:   Height _0  (1.626 m), weight 149 lb (67.6 kg).  (if some vitals are omitted, this means that patient was UNABLE to obtain them even though they were asked to get them prior to OV today.  They were asked to call us at their earliest convenience with these once obtained.)  General: A & O * 3; visually in no acute distress; in usual state of health.  Skin: Visible skin appears normal and pt's usual skin color HEENT:  EOMI, head is normocephalic and atraumatic.  Sclera are anicteric. Neck has a good range of motion.  Lips are noncyanotic Chest: normal chest excursion and movement Respiratory: speaking in full sentences, no conversational dyspnea; no use of accessory muscles Psych: insight good, mood- appears full

## 2019-12-05 ENCOUNTER — Telehealth: Payer: Self-pay | Admitting: Family Medicine

## 2019-12-05 ENCOUNTER — Other Ambulatory Visit: Payer: Self-pay | Admitting: Family Medicine

## 2019-12-05 DIAGNOSIS — M797 Fibromyalgia: Secondary | ICD-10-CM

## 2019-12-05 MED ORDER — PREGABALIN 75 MG PO CAPS: 75.0000 mg | ORAL_CAPSULE | Freq: Two times a day (BID) | ORAL | 0 refills | Status: DC

## 2019-12-05 NOTE — Telephone Encounter (Signed)
Patient was seen 10/21/19 and told to follow up in 3-4 months. She has apt scheduled for 02/21/20.

## 2019-12-05 NOTE — Telephone Encounter (Signed)
Patient called request 90days REFILL on :   pregabalin (LYRICA) 75 MG capsule [id number] ENDED  Order Details Dose: 75 mg Route: Oral Frequency: 2 times daily  Dispense Quantity: 60 capsule Refills: 2   Indications of Use: Fibromyalgia Syndrome, Neuropathic Pain      Sig: Take 1 capsule (75 mg total) by mouth 2 (two) times daily.       Per patient send refill order to:   [place], [place] [phone] (Phone) [phone] (Fax)   --Dion Body

## 2019-12-05 NOTE — Addendum Note (Signed)
Addended by: Mickel Crow on: 12/05/2019 02:18 PM   Modules accepted: Orders

## 2019-12-07 MED ORDER — PREGABALIN 75 MG PO CAPS: 75.0000 mg | ORAL_CAPSULE | Freq: Two times a day (BID) | ORAL | 0 refills | Status: DC

## 2019-12-08 ENCOUNTER — Ambulatory Visit: Payer: BC Managed Care – PPO | Attending: Internal Medicine

## 2019-12-08 DIAGNOSIS — Z23 Encounter for immunization: Secondary | ICD-10-CM | POA: Diagnosis not present

## 2019-12-08 NOTE — Progress Notes (Signed)
   Covid-19 Vaccination Clinic  Name:  Pam Scott    MRN: [id number] DOB: 1945-12-17  12/08/2019  [name] was observed post Covid-19 immunization for 15 minutes without incidence. She was provided with Vaccine Information Sheet and instruction to access the V-Safe system.   [name] was instructed to call 911 with any severe reactions post vaccine: Marland Kitchen Difficulty breathing  . Swelling of your face and throat  . A fast heartbeat  . A bad rash all over your body  . Dizziness and weakness    Immunizations Administered    Name Date Dose VIS Date Route   Pfizer COVID-19 Vaccine 12/08/2019  2:19 PM 0.3 mL 10/28/2019 Intramuscular   Manufacturer: [place]   Lot: [id number]   [place]: [id number]

## 2019-12-09 ENCOUNTER — Other Ambulatory Visit: Payer: Self-pay

## 2019-12-09 ENCOUNTER — Ambulatory Visit
Admission: RE | Admit: 2019-12-09 | Discharge: 2019-12-09 | Disposition: A | Discharge status: Home / Self Care | Payer: BC Managed Care – PPO | Source: Ambulatory Visit | Attending: Family Medicine | Admitting: Family Medicine

## 2019-12-09 DIAGNOSIS — Z1231 Encounter for screening mammogram for malignant neoplasm of breast: Secondary | ICD-10-CM

## 2019-12-13 ENCOUNTER — Ambulatory Visit
Admission: RE | Admit: 2019-12-13 | Discharge: 2019-12-13 | Disposition: A | Discharge status: Home / Self Care | Payer: BC Managed Care – PPO | Source: Ambulatory Visit | Attending: Orthopedic Surgery | Admitting: Orthopedic Surgery

## 2019-12-13 ENCOUNTER — Other Ambulatory Visit: Payer: Self-pay

## 2019-12-13 ENCOUNTER — Other Ambulatory Visit: Payer: BC Managed Care – PPO

## 2019-12-13 DIAGNOSIS — M25531 Pain in right wrist: Secondary | ICD-10-CM

## 2019-12-13 MED ORDER — IOPAMIDOL (ISOVUE-M 200) INJECTION 41%
2.0000 mL | Freq: Once | INTRAMUSCULAR | Status: AC
  Administered 2019-12-13: 2 mL via INTRA_ARTICULAR

## 2019-12-14 DIAGNOSIS — M25531 Pain in right wrist: Secondary | ICD-10-CM | POA: Diagnosis not present

## 2019-12-28 ENCOUNTER — Ambulatory Visit: Payer: BC Managed Care – PPO | Attending: Internal Medicine

## 2019-12-28 DIAGNOSIS — Z23 Encounter for immunization: Secondary | ICD-10-CM | POA: Insufficient documentation

## 2019-12-28 NOTE — Progress Notes (Signed)
   Covid-19 Vaccination Clinic  Name:  Pam Scott    MRN: [id number] DOB: [birth year]  12/28/2019  Pam Scott was observed post Covid-19 immunization for 15 minutes without incidence. She was provided with Vaccine Information Sheet and instruction to access the V-Safe system.   Pam Scott was instructed to call 911 with any severe reactions post vaccine: Marland Kitchen Difficulty breathing  . Swelling of your face and throat  . A fast heartbeat  . A bad rash all over your body  . Dizziness and weakness    Immunizations Administered    Name Date Dose VIS Date Route   Pfizer COVID-19 Vaccine 12/28/2019  1:51 PM 0.3 mL 10/28/2019 Intramuscular   Manufacturer: [place]   Lot: [id number]   [place]: [id number]

## 2020-01-03 DIAGNOSIS — S638X1A Sprain of other part of right wrist and hand, initial encounter: Secondary | ICD-10-CM | POA: Diagnosis not present

## 2020-01-03 DIAGNOSIS — M1811 Unilateral primary osteoarthritis of first carpometacarpal joint, right hand: Secondary | ICD-10-CM | POA: Diagnosis not present

## 2020-01-31 ENCOUNTER — Other Ambulatory Visit: Payer: Self-pay | Admitting: Family Medicine

## 2020-01-31 DIAGNOSIS — I10 Essential (primary) hypertension: Secondary | ICD-10-CM

## 2020-01-31 MED ORDER — OLMESARTAN MEDOXOMIL 20 MG PO TABS: 20.0000 mg | ORAL_TABLET | Freq: Every day | ORAL | 0 refills | Status: DC

## 2020-01-31 NOTE — Telephone Encounter (Signed)
Pt left message while office clsd for lunch request Rx refill on :   ---( states has appt on 4/9 w/ Dr. Raliegh Scarlet)  olmesartan (BENICAR) 20 MG tablet [id number]   Order Details Dose, Route, Frequency: As Directed  Dispense Quantity: 90 tablet Refills: 1       Sig: TAKE 1 TABLET DAILY       -----Forwarding request to med asst that if approved send refill order to :   Preferred Pharmacies      [facility] [place], [place] - [address] [phone] (Phone) [phone] (Fax)   ----Dion Body

## 2020-01-31 NOTE — Telephone Encounter (Signed)
Med refill sent in. AS, CMA

## 2020-02-21 ENCOUNTER — Other Ambulatory Visit: Payer: Self-pay

## 2020-02-21 ENCOUNTER — Other Ambulatory Visit: Payer: BC Managed Care – PPO

## 2020-02-21 DIAGNOSIS — Z79899 Other long term (current) drug therapy: Secondary | ICD-10-CM | POA: Diagnosis not present

## 2020-02-21 DIAGNOSIS — E785 Hyperlipidemia, unspecified: Secondary | ICD-10-CM | POA: Diagnosis not present

## 2020-02-22 LAB — ALT: ALT: 28 IU/L (ref 0–32)

## 2020-02-22 LAB — LIPID PANEL
Chol/HDL Ratio: 2.1 ratio (ref 0.0–4.4)
Cholesterol, Total: 153 mg/dL (ref 100–199)
HDL: 73 mg/dL (ref >39)
LDL Chol Calc (NIH): 61 mg/dL (ref 0–99)
Triglycerides: 105 mg/dL (ref 0–149)
VLDL Cholesterol Cal: 19 mg/dL (ref 5–40)

## 2020-02-24 ENCOUNTER — Encounter: Payer: Self-pay | Admitting: Family Medicine

## 2020-02-24 ENCOUNTER — Ambulatory Visit (INDEPENDENT_AMBULATORY_CARE_PROVIDER_SITE_OTHER): Payer: BC Managed Care – PPO | Admitting: Family Medicine

## 2020-02-24 ENCOUNTER — Other Ambulatory Visit: Payer: Self-pay

## 2020-02-24 VITALS — Ht 64.0 in | Wt 146.0 lb

## 2020-02-24 DIAGNOSIS — R7303 Prediabetes: Secondary | ICD-10-CM

## 2020-02-24 DIAGNOSIS — M79646 Pain in unspecified finger(s): Secondary | ICD-10-CM

## 2020-02-24 DIAGNOSIS — M792 Neuralgia and neuritis, unspecified: Secondary | ICD-10-CM

## 2020-02-24 DIAGNOSIS — Z79899 Other long term (current) drug therapy: Secondary | ICD-10-CM

## 2020-02-24 DIAGNOSIS — G8929 Other chronic pain: Secondary | ICD-10-CM

## 2020-02-24 DIAGNOSIS — I10 Essential (primary) hypertension: Secondary | ICD-10-CM

## 2020-02-24 DIAGNOSIS — E785 Hyperlipidemia, unspecified: Secondary | ICD-10-CM

## 2020-02-24 NOTE — Progress Notes (Signed)
Telehealth office visit note for Pam Scott, Pam Scott- at Primary Care at [facility]   I connected with current patient today and verified that I am speaking with the correct person   . Location of the patient: Home . Location of the provider: Office - This visit type was conducted due to national recommendations for restrictions regarding the COVID-19 Pandemic (e.g. social distancing) in an effort to limit this patient's exposure and mitigate transmission in our community.    - No physical exam could be performed with this format, beyond that communicated to [place] by the patient/ family members as noted.   - Additionally my office staff/ schedulers were to discuss with the patient that there may be a monetary charge related to this service, depending on their medical insurance.  My understanding is that patient understood and consented to proceed.     _________________________________________________________________________________   History of Present Illness:  I, Pam Scott, am serving as Education administrator for [employer].  She is doing well on her medications and tolerating well.  - Lifestyle, Exercise & Diet Feels that her exercise has improved now that the weather is better.  She is outside walking more, working in the yard, "I'm just worn out from trying to get all my plants in and stuff."  States "I'm active, I'm very active," and trying to eat right.  She is trying to adopt a heart healthy diet.  Her husband recently had gastric bypass surgery, and "he definitely does not eat like he used to, of course."  Says that this change in his dietary habits has helped her as well.  They eat a lot of fish.  In particular, she mentions salmon, catfish, tuna, all grilled.  - Hydration She states she drinks a lot of water, at least five sixteen ounce bottles per day.  Says she always has water with her when she's working in the yard, and "takes water to bed with me at night."  - Thumb  numbness, wrist, etc.; has seen orthopedics and hand specialist Says her wrist/thumb/little finger area still hurts after a year of symptoms.    Remarks "it's either a tendon or a nerve."  Says "it's from the wrist up toward your small little finger."  She thinks she has more arthritis on the side of her thumb, and "this problem has nothing to do with the stroke, it's been over a year."  She wears her wrist brace time to time, puts it on and sleeps with it on, and notes "it does help."  For additional assistance, she has gone to her orthopedic doctor, had an MRI done, and was sent to the hand doctor for a nerve test.  However, after she got the bill for the MRI, she decided she needed to put off further testing for a while.  She did schedule the nerve test, but wanted to pay off the MRI bill first.  To alleviate her symptoms, she has tried CBD creams, and notes her concerns "come and go."  She's not in pain all the time, "but a lot of the time."  Says "I really do not want to do surgery unless it's a last resort."  She had an injection on the thumb area that did help for a while, "but it's starting to creep back up a bit."  Her fingers are also still numb on the right side from her stroke, and "it's a sensation that just hurts."  Says "they hoped [this numbness] would go  away after six months," but nothing else has been done to alleviate these symptoms.    She plans to ask her neurologist next visit about her options to alleviate her concerns.  "I don't know what the therapy for that is, it's just really aggravating."  HPI:  Hyperlipidemia:  74 y.o. female here for cholesterol follow-up.   - Patient reports good compliance with treatment plan of:  medication and/ or lifestyle management.    - Patient denies any acute concerns or problems with management plan   - She denies new onset of: myalgias, arthralgias, increased fatigue more than normal, chest pains, exercise intolerance, shortness of  breath, dizziness, visual changes, headache, lower extremity swelling or claudication.   Most recent cholesterol panel was:  Lab Results  Component Value Date   CHOL 153 02/21/2020   HDL 73 02/21/2020   LDLCALC 61 02/21/2020   LDLDIRECT 104 (H) 12/21/2018   TRIG 105 02/21/2020   CHOLHDL 2.1 02/21/2020   Hepatic Function Latest Ref Rng & Units 02/21/2020 10/17/2019 06/08/2019  Total Protein 6.0 - 8.5 g/dL - 6.8 7.3  Albumin 3.7 - 4.7 g/dL - 4.5 4.2  AST 0 - 40 IU/L - 22 24  ALT 0 - 32 IU/L '28 27 30  ' Alk Phosphatase 39 - 117 IU/L - 81 56  Total Bilirubin 0.0 - 1.2 mg/dL - 0.6 0.7  Bilirubin, Direct 0.0 - 0.3 mg/dL - - -       GAD 7 : Generalized Anxiety Score 06/28/2019  Nervous, Anxious, on Edge 0  Control/stop worrying 0  Worry too much - different things 0  Trouble relaxing 0  Restless 0  Easily annoyed or irritable 0  Afraid - awful might happen 0  Total GAD 7 Score 0  Anxiety Difficulty Not difficult at all    Depression screen [facility] 2/9 02/24/2020 10/21/2019 06/28/2019 12/21/2018 10/12/2018  Decreased Interest 0 0 0 0 0  Down, Depressed, Hopeless 0 0 0 0 0  PHQ - 2 Score 0 0 0 0 0  Altered sleeping 2 0 '1 3 1  ' Tired, decreased energy 0 '1 1 1 1  ' Change in appetite 0 0 0 0 0  Feeling bad or failure about yourself  0 0 0 0 0  Trouble concentrating 0 0 0 0 0  Moving slowly or fidgety/restless 0 0 0 0 0  Suicidal thoughts 0 0 0 0 0  PHQ-9 Score '2 1 2 4 2  ' Difficult doing work/chores Not difficult at all Not difficult at all Not difficult at all Not difficult at all Not difficult at all      Impression and Recommendations:    1. Hyperlipidemia, goal less than 70   2. On statin therapy   3. Essential hypertension, benign   4. Neuropathic pain-due to fibromyalgia and CVA   5. Prediabetes- newly Dx Feb 2020   6. Thumb pain, chronic       Hyperlipidemia with target LDL less than 70 - on statin therapy, history of CVA - Last FLP obtained 3 days ago: Triglycerides = 105,  down from 121 prior. HDL = 73, up from 71 prior. LDL = 61, down from 73 prior.  - Cholesterol levels are at goal, stable and well-controlled. - Per patient, tolerating statins well without S-E. - Pt will continue current treatment regimen. See med list.  - Prudent dietary changes such as low saturated & trans fat diets for hyperlipidemia and low carb diets for hypertriglyceridemia discussed with patient.    -  Encouraged patient to follow AHA guidelines for regular exercise and maintain a BMI of <25.  - We will continue to monitor and re-check as discussed.   Essential hypertension, benign - Stable at this time. - Continue management as established.  See med list.  - Patient knows to continue to monitor her blood pressure at home.  - Will continue to monitor.   Neuropathic pain-due to fibromyalgia and CVA - Overall stable at this time. - Continue management as established.  See med list.  - Patient will continue to follow up with neurology as established.  - Will continue to monitor alongside specialists.   Thumb Pain, Unspecified Laterality - Discussed patient's ongoing symptoms during appointment today.  - As patient has followed up with orthopedic specialist and hand specialist in the past, STRONGLY encouraged patient to continue follow-up with indicated specialists to address her additional concerns.  - Encouraged patient to obtain nerve study and further assessment as recommended by hand specialist if at all possible.  - Will continue to monitor alongside specialists as discussed.   Prediabetes- newly Dx Feb 2020 - Last A1c obtained 4 months ago. - 6.0, down from 6.1 prior.  Stable at this time. - Will continue to monitor and re-check as discussed.   Health Counseling & Preventative Maintenance - Advised patient to continue working toward exercising to improve overall mental, physical, and emotional health.    - Reviewed the "spokes of the wheel" of wellbeing.   Stressed the importance of ongoing prudent habits, including regular exercise, appropriate sleep hygiene, healthful dietary habits, and prayer/meditation to relax.  - Encouraged patient to engage in daily physical activity as tolerated, especially a formal exercise routine.  Recommended that the patient eventually strive for at least 150 minutes of moderate cardiovascular activity per week according to guidelines established by the [facility].   - Healthy dietary habits encouraged, including low-carb, and high amounts of lean protein in diet.   - Patient should also consume adequate amounts of water, at least 75 ounces per day, more if she is physically active.  - Health counseling performed.  All questions answered.  - As part of my medical decision making, I reviewed the following data within the [facility] History obtained from pt /family, CMA notes reviewed and incorporated if applicable, Labs reviewed, Radiograph/ tests reviewed if applicable and OV notes from prior OV's with me, as well as other specialists she/he has seen since seeing me last, were all reviewed and used in my medical decision making process today.    - Additionally, when appropriate, discussion had with patient regarding our treatment plan, and their biases/concerns about that plan were used in my medical decision making today.    - The patient agreed with the plan and demonstrated an understanding of the instructions.   No barriers to understanding were identified.     - The patient was advised to call back or seek an in-person evaluation if the symptoms worsen or if the condition fails to improve as anticipated.   Return for f/up in 4-6 months for chronic concerns.    Time spent on visit including pre-visit chart review and post-visit care was 19 minutes.    Note:  This note was prepared with assistance of Dragon voice recognition software. Occasional wrong-word or sound-a-like substitutions may have  occurred due to the inherent limitations of voice recognition software.  The [place] was signed into law in 2016 which includes the topic of electronic health records.  This provides immediate access to information in MyChart.  This includes consultation notes, operative notes, office notes, lab results and pathology reports.  If you have any questions about what you read please let us know at your next visit or call us at the office.  We are right here with you.  This document serves as a record of services personally performed by Pam Dance, DO. It was created on her behalf by Pam Scott, a trained medical scribe. The creation of this record is based on the scribe's personal observations and the provider's statements to them.    The above documentation from Pam Scott, medical scribe, has been reviewed by Pam Scott, Pam Scott.    __________________________________________________________________________________     Patient Care Team    Relationship Specialty Notifications Start End  Pam Dance, DO PCP - General Family Medicine  10/11/18   Marchia Bond, MD Consulting Physician Orthopedic Surgery  10/12/18   Levy Sjogren, MD Referring Physician Dermatology  10/12/18   Wallene Huh, DPM Consulting Physician Podiatry  10/12/18   Juluis Rainier  Optometry  10/12/18   Garvin Fila, MD Consulting Physician Neurology  10/21/19   Charlotte Crumb, MD Consulting Physician Orthopedic Surgery  02/24/20    Comment: hand ortho-   [facility]  Mickel Fuchs, MD Referring Physician Psychiatry  02/24/20      -Vitals obtained; medications/ allergies reconciled;  personal medical, social, Sx etc.histories were updated by CMA, reviewed by me and are reflected in chart   Patient Active Problem List   Diagnosis Date Noted  . Hyperlipidemia with target LDL less than 130 11/30/2009  . Essential hypertension, benign 11/30/2009  . Neuropathic  pain-due to fibromyalgia, sees Dr. Lenna Gilford of rheumatology 10/12/2018  . DEPRESSIVE DISORDER 11/30/2009  . Osteoporosis 11/30/2009  . Vitamin D deficiency 10/12/2018  . Chronic foot pain, right-and ankle; sees Dr. Paulla Dolly of podiatry  10/12/2018  . Long-term current use of opiate analgesic 04/13/2018  . Fibromyalgia muscle pain 07/28/2012  . Anticoagulated 06/28/2019  . Numbness and tingling of right side of face 06/08/2019  . Left thalamic infarction ([place]) 06/08/2019  . Prediabetes- newly Dx Feb 2020 12/29/2018  . History of adjustment disorder 10/12/2018  . Allergic contact dermatitis due to plants, except food 03/15/2018  . Low back pain at multiple sites 01/21/2018  . Estrogen deficiency 09/16/2016  . Primary osteoarthritis of right shoulder 04/17/2015  . Nonspecific abnormal electrocardiogram (ECG) (EKG) 06/28/2014  . Middle insomnia 06/28/2014  . Cervical radiculitis 07/06/2013  . Bradycardia 06/23/2013  . Routine general medical examination at a health care facility 04/19/2012  . Allergic rhinitis due to other allergen 03/20/2010     Current Meds  Medication Sig  . aspirin 81 MG EC tablet Take 1 tablet (81 mg total) by mouth daily.  Marland Kitchen atorvastatin (LIPITOR) 80 MG tablet Take 1 tablet (80 mg total) by mouth at bedtime.  . bimatoprost (LUMIGAN) 0.03 % ophthalmic drops Place 1 drop into both eyes at bedtime.   . Cholecalciferol (VITAMIN D3) 2000 UNITS TABS Take 1 tablet by mouth daily.   . hydrochlorothiazide (MICROZIDE) 12.5 MG capsule TAKE 1 CAPSULE DAILY  . olmesartan (BENICAR) 20 MG tablet Take 1 tablet (20 mg total) by mouth daily.  . pregabalin (LYRICA) 75 MG capsule Take 1 capsule (75 mg total) by mouth 2 (two) times daily.     Allergies:  Allergies  Allergen Reactions  . Amlodipine     edema  . Simvastatin  myalgias  . Erythromycin Dermatitis     ROS:  See above HPI for pertinent positives and negatives   Objective:   Height '5\' 4"'  (1.626 m), weight 146  lb (66.2 kg).  (if some vitals are omitted, this means that patient was UNABLE to obtain them even though they were asked to get them prior to OV today.  They were asked to call us at their earliest convenience with these once obtained. ) General: A & O * 3; sounds in no acute distress; in usual state of health.  Skin: Pt confirms warm and dry extremities and pink fingertips HEENT: Pt confirms lips non-cyanotic Chest: Patient confirms normal chest excursion and movement Respiratory: speaking in full sentences, no conversational dyspnea; patient confirms no use of accessory muscles Psych: insight appears good, mood- appears full

## 2020-03-01 DIAGNOSIS — M25531 Pain in right wrist: Secondary | ICD-10-CM | POA: Diagnosis not present

## 2020-03-05 ENCOUNTER — Other Ambulatory Visit: Payer: Self-pay | Admitting: Family Medicine

## 2020-03-05 DIAGNOSIS — I639 Cerebral infarction, unspecified: Secondary | ICD-10-CM

## 2020-03-05 DIAGNOSIS — E785 Hyperlipidemia, unspecified: Secondary | ICD-10-CM

## 2020-03-05 DIAGNOSIS — I6381 Other cerebral infarction due to occlusion or stenosis of small artery: Secondary | ICD-10-CM

## 2020-03-05 MED ORDER — ATORVASTATIN CALCIUM 80 MG PO TABS: 80.0000 mg | ORAL_TABLET | Freq: Every day | ORAL | 0 refills | Status: DC

## 2020-03-05 NOTE — Telephone Encounter (Signed)
Refill request received via fax. AS, CMA

## 2020-03-26 DIAGNOSIS — M1811 Unilateral primary osteoarthritis of first carpometacarpal joint, right hand: Secondary | ICD-10-CM | POA: Diagnosis not present

## 2020-03-27 DIAGNOSIS — H401132 Primary open-angle glaucoma, bilateral, moderate stage: Secondary | ICD-10-CM | POA: Diagnosis not present

## 2020-04-04 ENCOUNTER — Ambulatory Visit: Payer: BC Managed Care – PPO | Admitting: Adult Health

## 2020-05-01 ENCOUNTER — Other Ambulatory Visit: Payer: Self-pay | Admitting: Physician Assistant

## 2020-05-01 DIAGNOSIS — I10 Essential (primary) hypertension: Secondary | ICD-10-CM

## 2020-05-01 MED ORDER — OLMESARTAN MEDOXOMIL 20 MG PO TABS: 20.0000 mg | ORAL_TABLET | Freq: Every day | ORAL | 0 refills | Status: DC

## 2020-05-01 NOTE — Telephone Encounter (Signed)
Rx sent to pharmacy. AS, CMA 

## 2020-05-01 NOTE — Telephone Encounter (Signed)
Patient called requested refill on :   olmesartan (BENICAR) 20 MG tablet [[id number]]   Order Details Dose: 20 mg Route: Oral Frequency: Daily  Dispense Quantity: 90 tablet Refills: 0       Sig: Take 1 tablet (20 mg total) by mouth daily.   --Pt's LOV 03/05/20 w/ Dr. Jenetta Downer  Pt uses :   [facility] - [name], [place]  [address], [place] [address]  Phone:  [phone] Fax:  [phone]   --Dion Body

## 2020-05-15 DIAGNOSIS — S46012A Strain of muscle(s) and tendon(s) of the rotator cuff of left shoulder, initial encounter: Secondary | ICD-10-CM | POA: Diagnosis not present

## 2020-05-22 ENCOUNTER — Other Ambulatory Visit: Payer: Self-pay | Admitting: Family Medicine

## 2020-05-22 DIAGNOSIS — M797 Fibromyalgia: Secondary | ICD-10-CM

## 2020-05-28 ENCOUNTER — Telehealth: Payer: Self-pay | Admitting: Physician Assistant

## 2020-05-28 DIAGNOSIS — M797 Fibromyalgia: Secondary | ICD-10-CM

## 2020-05-28 MED ORDER — PREGABALIN 75 MG PO CAPS: 75.0000 mg | ORAL_CAPSULE | Freq: Two times a day (BID) | ORAL | 0 refills | Status: DC

## 2020-05-28 NOTE — Telephone Encounter (Signed)
Patient is requesting a refill of her Lyrica, if approved please send to [facility]

## 2020-05-28 NOTE — Addendum Note (Signed)
Addended by: Fonnie Mu on: 05/28/2020 02:29 PM   Modules accepted: Orders

## 2020-06-04 ENCOUNTER — Telehealth: Payer: Self-pay | Admitting: Physician Assistant

## 2020-06-04 DIAGNOSIS — Z1211 Encounter for screening for malignant neoplasm of colon: Secondary | ICD-10-CM

## 2020-06-04 DIAGNOSIS — I6381 Other cerebral infarction due to occlusion or stenosis of small artery: Secondary | ICD-10-CM

## 2020-06-04 DIAGNOSIS — E785 Hyperlipidemia, unspecified: Secondary | ICD-10-CM

## 2020-06-04 MED ORDER — ATORVASTATIN CALCIUM 80 MG PO TABS: 80.0000 mg | ORAL_TABLET | Freq: Every day | ORAL | 0 refills | Status: DC

## 2020-06-04 NOTE — Telephone Encounter (Signed)
Patient is aware of the below. AS, CMA 

## 2020-06-04 NOTE — Addendum Note (Signed)
Addended by: Mickel Crow on: 06/04/2020 09:52 AM   Modules accepted: Orders

## 2020-06-04 NOTE — Telephone Encounter (Signed)
Patient is requesting a refill of her Lipitor, if approved please send to [facility].  Also, she thinks she is due for another Cologuard, she wants to verify this and if she is due please place order for patient.

## 2020-06-04 NOTE — Telephone Encounter (Signed)
Refill of Lipitor sent to requested pharmacy.   Last Cologuard was 03/2017. Suggest repeat every 3 years. Placed ordered for Cologuard per standing orders. AS, CMA

## 2020-06-04 NOTE — Addendum Note (Signed)
Addended by: Mickel Crow on: 06/04/2020 09:53 AM   Modules accepted: Orders

## 2020-06-16 DIAGNOSIS — T63441A Toxic effect of venom of bees, accidental (unintentional), initial encounter: Secondary | ICD-10-CM | POA: Diagnosis not present

## 2020-06-16 DIAGNOSIS — T7840XA Allergy, unspecified, initial encounter: Secondary | ICD-10-CM | POA: Diagnosis not present

## 2020-06-21 DIAGNOSIS — Z1211 Encounter for screening for malignant neoplasm of colon: Secondary | ICD-10-CM | POA: Diagnosis not present

## 2020-06-21 LAB — COLOGUARD: Cologuard: POSITIVE — AB

## 2020-06-27 LAB — COLOGUARD: COLOGUARD: POSITIVE — AB

## 2020-07-02 ENCOUNTER — Other Ambulatory Visit: Payer: Self-pay | Admitting: Physician Assistant

## 2020-07-02 ENCOUNTER — Encounter: Payer: Self-pay | Admitting: Gastroenterology

## 2020-07-02 ENCOUNTER — Encounter: Payer: Self-pay | Admitting: Physician Assistant

## 2020-07-02 DIAGNOSIS — R195 Other fecal abnormalities: Secondary | ICD-10-CM

## 2020-07-02 DIAGNOSIS — Z1211 Encounter for screening for malignant neoplasm of colon: Secondary | ICD-10-CM

## 2020-07-02 NOTE — Progress Notes (Signed)
Patient is aware of abnormal cologuard result and referral to [facility] for colonoscopy has been placed. AS, CMA

## 2020-07-09 DIAGNOSIS — H1132 Conjunctival hemorrhage, left eye: Secondary | ICD-10-CM | POA: Diagnosis not present

## 2020-07-25 ENCOUNTER — Other Ambulatory Visit: Payer: Self-pay

## 2020-07-25 ENCOUNTER — Ambulatory Visit
Admission: EM | Admit: 2020-07-25 | Discharge: 2020-07-25 | Disposition: A | Discharge status: Home / Self Care | Payer: BC Managed Care – PPO

## 2020-07-25 DIAGNOSIS — J392 Other diseases of pharynx: Secondary | ICD-10-CM | POA: Diagnosis not present

## 2020-07-25 DIAGNOSIS — H9203 Otalgia, bilateral: Secondary | ICD-10-CM

## 2020-07-25 DIAGNOSIS — R52 Pain, unspecified: Secondary | ICD-10-CM

## 2020-07-25 NOTE — ED Provider Notes (Signed)
[facility]    CSN: [id number] Arrival date & time: 07/25/20  1023      History   Chief Complaint Chief Complaint  Patient presents with  . Fatigue    HPI Pam Scott is a 74 y.o. female.   74 year old female comes in for 3 day of URI symptoms. Rhinorrhea, throat irritation, bilateral ear pain, body aches, swollen lymph node, fatigue. Denies fever, chills, body aches. Denies abdominal pain, nausea, vomiting, diarrhea. Denies shortness of breath, loss of taste/smell. Never smoker. Tylenol. Negative COVID testing few days ago when having body aches. Positive covid testing. COVID vaccinated.     Past Medical History:  Diagnosis Date  . Fibromyalgia   . Hyperlipidemia   . Hypertension   . Osteoporosis     Patient Active Problem List   Diagnosis Date Noted  . Anticoagulated 06/28/2019  . Numbness and tingling of right side of face 06/08/2019  . Left thalamic infarction ([place]) 06/08/2019  . Prediabetes- newly Dx Feb 2020 12/29/2018  . History of adjustment disorder 10/12/2018  . Vitamin D deficiency 10/12/2018  . Chronic foot pain, right-and ankle; sees Dr. Paulla Dolly of podiatry  10/12/2018  . Neuropathic pain-due to fibromyalgia, sees Dr. Lenna Gilford of rheumatology 10/12/2018  . Long-term current use of opiate analgesic 04/13/2018  . Allergic contact dermatitis due to plants, except food 03/15/2018  . Low back pain at multiple sites 01/21/2018  . Estrogen deficiency 09/16/2016  . Primary osteoarthritis of right shoulder 04/17/2015  . Nonspecific abnormal electrocardiogram (ECG) (EKG) 06/28/2014  . Middle insomnia 06/28/2014  . Cervical radiculitis 07/06/2013  . Bradycardia 06/23/2013  . Fibromyalgia muscle pain 07/28/2012  . Routine general medical examination at a health care facility 04/19/2012  . Allergic rhinitis due to other allergen 03/20/2010  . Hyperlipidemia with target LDL less than 130 11/30/2009  . DEPRESSIVE DISORDER 11/30/2009  . Essential  hypertension, benign 11/30/2009  . Osteoporosis 11/30/2009    Past Surgical History:  Procedure Laterality Date  . ABDOMINAL HYSTERECTOMY    . CATARACT EXTRACTION    . CYSTOCELE REPAIR    . ROTATOR CUFF REPAIR      OB History   No obstetric history on file.      Home Medications    Prior to Admission medications   Medication Sig Start Date End Date Taking? Authorizing Provider  aspirin 81 MG EC tablet Take 1 tablet (81 mg total) by mouth daily. 06/10/19   Mariel Aloe, MD  atorvastatin (LIPITOR) 80 MG tablet Take 1 tablet (80 mg total) by mouth at bedtime. 06/04/20   Abonza, Maritza, PA-C  bimatoprost (LUMIGAN) 0.03 % ophthalmic drops Place 1 drop into both eyes at bedtime.     [provider]  Cholecalciferol (VITAMIN D3) 2000 UNITS TABS Take 1 tablet by mouth daily.     [provider]  hydrochlorothiazide (MICROZIDE) 12.5 MG capsule TAKE 1 CAPSULE DAILY 10/03/19   Opalski, Neoma Laming, DO  olmesartan (BENICAR) 20 MG tablet Take 1 tablet (20 mg total) by mouth daily. 05/01/20   Lorrene Reid, PA-C  pregabalin (LYRICA) 75 MG capsule Take 1 capsule (75 mg total) by mouth 2 (two) times daily. 05/28/20 08/26/20  Lorrene Reid, PA-C    Family History Family History  Problem Relation Age of Onset  . Hypertension Father   . Hodgkin's lymphoma Mother   . Cancer Neg Hx   . Hyperlipidemia Neg Hx   . Heart disease Neg Hx   . Diabetes Neg Hx   .  Stroke Neg Hx     Social History Social History   Tobacco Use  . Smoking status: Never Smoker  . Smokeless tobacco: Never Used  Vaping Use  . Vaping Use: Never used  Substance Use Topics  . Alcohol use: Yes    Alcohol/week: 5.0 - 7.0 standard drinks    Types: 5 - 7 Standard drinks or equivalent per week  . Drug use: No     Allergies   Amlodipine, Simvastatin, and Erythromycin   Review of Systems Review of Systems  Reason unable to perform ROS: See HPI as above.     Physical Exam Triage Vital  Signs ED Triage Vitals  Enc Vitals Group     BP 07/25/20 1233 (!) 163/84     Pulse Rate 07/25/20 1233 69     Resp 07/25/20 1233 16     Temp 07/25/20 1233 97.8 F (36.6 C)     Temp Source 07/25/20 1233 Oral     SpO2 07/25/20 1233 94 %     Weight --      Height --      Head Circumference --      Peak Flow --      Pain Score 07/25/20 1250 6     Pain Loc --      Pain Edu? --      Excl. in [place]? --    No data found.  Updated Vital Signs BP (!) 163/84 (BP Location: Left Arm)   Pulse 69   Temp 97.8 F (36.6 C) (Oral)   Resp 16   SpO2 94%   Physical Exam Constitutional:      General: She is not in acute distress.    Appearance: Normal appearance. She is well-developed. She is not ill-appearing, toxic-appearing or diaphoretic.  HENT:     Head: Normocephalic and atraumatic.     Right Ear: Ear canal and external ear normal. A middle ear effusion is present. Tympanic membrane is not erythematous or bulging.     Left Ear: Ear canal and external ear normal. A middle ear effusion is present. Tympanic membrane is not erythematous or bulging.     Nose:     Right Sinus: No maxillary sinus tenderness or frontal sinus tenderness.     Left Sinus: No maxillary sinus tenderness or frontal sinus tenderness.     Mouth/Throat:     Mouth: Mucous membranes are moist.     Pharynx: Oropharynx is clear. Uvula midline.  Eyes:     Conjunctiva/sclera: Conjunctivae normal.     Pupils: Pupils are equal, round, and reactive to light.  Cardiovascular:     Rate and Rhythm: Normal rate and regular rhythm.  Pulmonary:     Effort: Pulmonary effort is normal. No accessory muscle usage, prolonged expiration, respiratory distress or retractions.     Breath sounds: No decreased air movement or transmitted upper airway sounds. No decreased breath sounds.     Comments: LCTAB Musculoskeletal:     Cervical back: Normal range of motion and neck supple.  Skin:    General: Skin is warm and dry.  Neurological:      Mental Status: She is alert and oriented to person, place, and time.      UC Treatments / Results  Labs (all labs ordered are listed, but only abnormal results are displayed) Labs Reviewed - No data to display  EKG   Radiology No results found.  Procedures Procedures (including critical care time)  Medications Ordered in UC Medications -  No data to display  Initial Impression / Assessment and Plan / UC Course  I have reviewed the triage vital signs and the nursing notes.  Pertinent labs & imaging results that were available during my care of the patient were reviewed by me and considered in my medical decision making (see chart for details).    Patient had Covid testing prior to all symptom onset, with positive Covid exposure, discussed may need Covid testing of symptoms worsen, to quarantine for now.  Symptomatic treatment discussed.  Return precautions given.  Final Clinical Impressions(s) / UC Diagnoses   Final diagnoses:  Throat irritation  Body aches  Otalgia of both ears    ED Prescriptions    None     PDMP not reviewed this encounter.   Ok Edwards, PA-C 07/25/20 1332

## 2020-07-25 NOTE — Discharge Instructions (Signed)
You can take over the counter flonase/nasacort to help with nasal congestion/drainage. Tylenol/motrin for pain and fever. Keep hydrated, urine should be clear to pale yellow in color. If experiencing shortness of breath, trouble breathing, go to the emergency department for further evaluation needed.

## 2020-07-25 NOTE — ED Triage Notes (Signed)
Pt c/o scratchy throat, bilateral ear pain, swollen lymph node, runny nose, body aches, and fatigue x3 days. States had a neg covid test Sunday.

## 2020-07-27 ENCOUNTER — Telehealth: Payer: Self-pay | Admitting: Physician Assistant

## 2020-07-27 DIAGNOSIS — I10 Essential (primary) hypertension: Secondary | ICD-10-CM

## 2020-07-27 MED ORDER — OLMESARTAN MEDOXOMIL 20 MG PO TABS: 20.0000 mg | ORAL_TABLET | Freq: Every day | ORAL | 0 refills | Status: DC

## 2020-07-27 NOTE — Addendum Note (Signed)
Addended by: Mickel Crow on: 07/27/2020 10:35 AM   Modules accepted: Orders

## 2020-07-27 NOTE — Telephone Encounter (Signed)
Patient is requesting a refill of her olmesartan, if approved please send to [facility].

## 2020-07-27 NOTE — Telephone Encounter (Signed)
Refill sent to requested pharmacy. AS, CMA 

## 2020-08-06 DIAGNOSIS — M7582 Other shoulder lesions, left shoulder: Secondary | ICD-10-CM | POA: Diagnosis not present

## 2020-08-22 DIAGNOSIS — H401132 Primary open-angle glaucoma, bilateral, moderate stage: Secondary | ICD-10-CM | POA: Diagnosis not present

## 2020-08-28 ENCOUNTER — Ambulatory Visit: Payer: BC Managed Care – PPO | Admitting: Gastroenterology

## 2020-09-04 ENCOUNTER — Ambulatory Visit: Payer: BC Managed Care – PPO | Admitting: Internal Medicine

## 2020-09-04 ENCOUNTER — Encounter: Payer: Self-pay | Admitting: Internal Medicine

## 2020-09-04 VITALS — BP 112/74 | HR 72 | Ht 64.0 in | Wt 149.0 lb

## 2020-09-04 DIAGNOSIS — R195 Other fecal abnormalities: Secondary | ICD-10-CM | POA: Diagnosis not present

## 2020-09-04 MED ORDER — SUTAB 1479-225-188 MG PO TABS: 1.0000 | ORAL_TABLET | Freq: Once | ORAL | 0 refills | Status: AC

## 2020-09-04 NOTE — Patient Instructions (Signed)
If you are age 72 or older, your body mass index should be between 23-30. Your Body mass index is 25.58 kg/m. If this is out of the aforementioned range listed, please consider follow up with your Primary Care Provider.  If you are age 59 or younger, your body mass index should be between 19-25. Your Body mass index is 25.58 kg/m. If this is out of the aformentioned range listed, please consider follow up with your Primary Care Provider.    You have been scheduled for a colonoscopy. Please follow written instructions given to you at your visit today.  Please pick up your prep supplies at the pharmacy within the next 1-3 days. If you use inhalers (even only as needed), please bring them with you on the day of your procedure.

## 2020-09-04 NOTE — Progress Notes (Signed)
HISTORY OF PRESENT ILLNESS:  Pam Scott is a 74 y.o. female, wife of Ed, who sent today regarding positive Cologuard testing.  Patient reports having undergone routine screening colonoscopy in [facility] around 2008.  The examination was simply unremarkable.  Approximately 3 years ago she submitted to Cologuard testing which was normal.  She underwent repeat Cologuard testing which was resulted June 21, 2020.  This returned positive.  Patient's GI review of systems is negative.  There is no family history of colon cancer.  Review of blood work from November 2020 shows unremarkable comprehensive metabolic panel.  Hemoglobin A1c 6.0.  She has completed her Covid vaccination series  REVIEW OF SYSTEMS:  All non-GI ROS negative unless otherwise stated in the HPI.  Past Medical History:  Diagnosis Date  . Fibromyalgia   . Hyperlipidemia   . Hypertension   . Osteoporosis     Past Surgical History:  Procedure Laterality Date  . ABDOMINAL HYSTERECTOMY    . CATARACT EXTRACTION    . CYSTOCELE REPAIR    . Dibble      Social History Pam Scott  reports that she has never smoked. She has never used smokeless tobacco. She reports current alcohol use of about 5.0 - 7.0 standard drinks of alcohol per week. She reports that she does not use drugs.  family history includes Hodgkin's lymphoma in her mother; Hypertension in her father.  Allergies  Allergen Reactions  . Amlodipine     edema  . Simvastatin     myalgias  . Erythromycin Dermatitis       PHYSICAL EXAMINATION: Vital signs: BP 112/74   Pulse 72   Ht 5\' 4"  (1.626 m)   Wt 149 lb (67.6 kg)   BMI 25.58 kg/m   Constitutional: generally well-appearing, no acute distress Psychiatric: alert and oriented x3, cooperative Eyes: extraocular movements intact, anicteric, conjunctiva pink Mouth: oral pharynx moist, no lesions Neck: supple no lymphadenopathy Cardiovascular: heart regular rate and rhythm,  no murmur Lungs: clear to auscultation bilaterally Abdomen: soft, nontender, nondistended, no obvious ascites, no peritoneal signs, normal bowel sounds, no organomegaly Rectal: Deferred to colonoscopy Extremities: no clubbing, cyanosis, or lower extremity edema bilaterally Skin: no lesions on visible extremities Neuro: No focal deficits.  Cranial nerves intact  ASSESSMENT:  1.  Positive Cologuard testing.  Rule out colorectal neoplasia   PLAN:  1.  Colonoscopy.The nature of the procedure, as well as the risks, benefits, and alternatives were carefully and thoroughly reviewed with the patient. Ample time for discussion and questions allowed. The patient understood, was satisfied, and agreed to proceed.

## 2020-09-05 ENCOUNTER — Other Ambulatory Visit: Payer: Self-pay

## 2020-09-05 ENCOUNTER — Ambulatory Visit (INDEPENDENT_AMBULATORY_CARE_PROVIDER_SITE_OTHER): Payer: BC Managed Care – PPO

## 2020-09-05 DIAGNOSIS — Z23 Encounter for immunization: Secondary | ICD-10-CM

## 2020-09-05 NOTE — Progress Notes (Signed)
Pt here for influenza vaccine.  Screening questionnaire reviewed, VIS provided to patient, and any/all patient questions answered.  T. Terril Amaro, CMA  

## 2020-09-10 ENCOUNTER — Telehealth: Payer: Self-pay | Admitting: Family Medicine

## 2020-09-10 DIAGNOSIS — I639 Cerebral infarction, unspecified: Secondary | ICD-10-CM

## 2020-09-10 DIAGNOSIS — I6381 Other cerebral infarction due to occlusion or stenosis of small artery: Secondary | ICD-10-CM

## 2020-09-10 DIAGNOSIS — E785 Hyperlipidemia, unspecified: Secondary | ICD-10-CM

## 2020-09-10 MED ORDER — ATORVASTATIN CALCIUM 80 MG PO TABS: 80.0000 mg | ORAL_TABLET | Freq: Every day | ORAL | 0 refills | Status: DC

## 2020-09-10 NOTE — Addendum Note (Signed)
Addended by: Fonnie Mu on: 09/10/2020 01:49 PM   Modules accepted: Orders

## 2020-09-10 NOTE — Telephone Encounter (Signed)
Patient needs a refill on Lipitor. [facility]. Thanks

## 2020-09-18 ENCOUNTER — Other Ambulatory Visit: Payer: Self-pay | Admitting: Sports Medicine

## 2020-09-18 DIAGNOSIS — M25512 Pain in left shoulder: Secondary | ICD-10-CM

## 2020-09-26 ENCOUNTER — Other Ambulatory Visit: Payer: BC Managed Care – PPO

## 2020-09-27 ENCOUNTER — Encounter: Payer: Self-pay | Admitting: Internal Medicine

## 2020-09-27 ENCOUNTER — Telehealth: Payer: Self-pay | Admitting: Internal Medicine

## 2020-09-27 ENCOUNTER — Ambulatory Visit (AMBULATORY_SURGERY_CENTER): Payer: BC Managed Care – PPO | Admitting: Internal Medicine

## 2020-09-27 ENCOUNTER — Other Ambulatory Visit: Payer: Self-pay

## 2020-09-27 VITALS — BP 153/87 | HR 61 | Temp 97.3°F | Resp 13 | Ht 64.0 in | Wt 149.0 lb

## 2020-09-27 DIAGNOSIS — D122 Benign neoplasm of ascending colon: Secondary | ICD-10-CM | POA: Diagnosis not present

## 2020-09-27 DIAGNOSIS — D120 Benign neoplasm of cecum: Secondary | ICD-10-CM | POA: Diagnosis not present

## 2020-09-27 DIAGNOSIS — K5730 Diverticulosis of large intestine without perforation or abscess without bleeding: Secondary | ICD-10-CM | POA: Diagnosis not present

## 2020-09-27 DIAGNOSIS — R195 Other fecal abnormalities: Secondary | ICD-10-CM | POA: Diagnosis not present

## 2020-09-27 DIAGNOSIS — Z1211 Encounter for screening for malignant neoplasm of colon: Secondary | ICD-10-CM | POA: Diagnosis not present

## 2020-09-27 MED ORDER — SODIUM CHLORIDE 0.9 % IV SOLN: 500.0000 mL | Freq: Once | INTRAVENOUS | Status: DC

## 2020-09-27 NOTE — Op Note (Signed)
[facility] Patient Name: Pam Scott Procedure Date: 09/27/2020 1:20 PM MRN: [id number] Endoscopist: Docia Chuck. Henrene Pastor MD, MD Age: 74 Referring MD:  Date of Birth: September 08, 1946 Gender: Female Account #: [id number] Procedure:                Colonoscopy with cold snare polypectomy x 3 Indications:              Positive Cologuard test Medicines:                Monitored Anesthesia Care Procedure:                Pre-Anesthesia Assessment:                           - Prior to the procedure, a History and Physical                            was performed, and patient medications and                            allergies were reviewed. The patient's tolerance of                            previous anesthesia was also reviewed. The risks                            and benefits of the procedure and the sedation                            options and risks were discussed with the patient.                            All questions were answered, and informed consent                            was obtained. Prior Anticoagulants: The patient has                            taken no previous anticoagulant or antiplatelet                            agents. ASA Grade Assessment: II - A patient with                            mild systemic disease. After reviewing the risks                            and benefits, the patient was deemed in                            satisfactory condition to undergo the procedure.                           After obtaining informed consent, the colonoscope  was passed under direct vision. Throughout the                            procedure, the patient's blood pressure, pulse, and                            oxygen saturations were monitored continuously. The                            Colonoscope was introduced through the anus and                            advanced to the the cecum, identified by                             appendiceal orifice and ileocecal valve. The                            ileocecal valve, appendiceal orifice, and rectum                            were photographed. The quality of the bowel                            preparation was excellent. The colonoscopy was                            performed without difficulty. The patient tolerated                            the procedure well. The bowel preparation used was                            SUPREP via split dose instruction. Scope In: 1:43:40 PM Scope Out: 2:00:33 PM Scope Withdrawal Time: 0 hours 14 minutes 1 second  Total Procedure Duration: 0 hours 16 minutes 53 seconds  Findings:                 Three polyps were found in the ascending colon and                            cecum. The polyps were 1 to 3 mm in size. These                            polyps were removed with a cold snare. Resection                            and retrieval were complete.                           Multiple diverticula were found in the left colon.                           The exam was otherwise without abnormality  on                            direct and retroflexion views. Complications:            No immediate complications. Estimated blood loss:                            None. Estimated Blood Loss:     Estimated blood loss: none. Impression:               - Three 1 to 3 mm polyps in the ascending colon and                            in the cecum, removed with a cold snare. Resected                            and retrieved.                           - Diverticulosis in the left colon.                           - The examination was otherwise normal on direct                            and retroflexion views. Recommendation:           - Repeat colonoscopy in 5 years for surveillance.                           - Patient has a contact number available for                            emergencies. The signs and symptoms of potential                             delayed complications were discussed with the                            patient. Return to normal activities tomorrow.                            Written discharge instructions were provided to the                            patient.                           - Resume previous diet.                           - Continue present medications.                           - Await pathology results. Docia Chuck. Henrene Pastor MD, MD 09/27/2020 2:11:31 PM This report has been signed electronically.

## 2020-09-27 NOTE — Progress Notes (Signed)
VS-SP

## 2020-09-27 NOTE — Progress Notes (Signed)
A and O x3. Report to RN. Tolerated MAC anesthesia well.

## 2020-09-27 NOTE — Patient Instructions (Signed)
Discharge instructions given. Handouts on polyps and diverticulosis. Resume previous medications. YOU HAD AN ENDOSCOPIC PROCEDURE TODAY AT THE  ENDOSCOPY CENTER:   Refer to the procedure report that was given to you for any specific questions about what was found during the examination.  If the procedure report does not answer your questions, please call your gastroenterologist to clarify.  If you requested that your care partner not be given the details of your procedure findings, then the procedure report has been included in a sealed envelope for you to review at your convenience later.  YOU SHOULD EXPECT: Some feelings of bloating in the abdomen. Passage of more gas than usual.  Walking can help get rid of the air that was put into your GI tract during the procedure and reduce the bloating. If you had a lower endoscopy (such as a colonoscopy or flexible sigmoidoscopy) you may notice spotting of blood in your stool or on the toilet paper. If you underwent a bowel prep for your procedure, you may not have a normal bowel movement for a few days.  Please Note:  You might notice some irritation and congestion in your nose or some drainage.  This is from the oxygen used during your procedure.  There is no need for concern and it should clear up in a day or so.  SYMPTOMS TO REPORT IMMEDIATELY:   Following lower endoscopy (colonoscopy or flexible sigmoidoscopy):  Excessive amounts of blood in the stool  Significant tenderness or worsening of abdominal pains  Swelling of the abdomen that is new, acute  Fever of 100F or higher   For urgent or emergent issues, a gastroenterologist can be reached at any hour by calling [phone]. Do not use MyChart messaging for urgent concerns.    DIET:  We do recommend a small meal at first, but then you may proceed to your regular diet.  Drink plenty of fluids but you should avoid alcoholic beverages for 24 hours.  ACTIVITY:  You should plan to take  it easy for the rest of today and you should NOT DRIVE or use heavy machinery until tomorrow (because of the sedation medicines used during the test).    FOLLOW UP: Our staff will call the number listed on your records 48-72 hours following your procedure to check on you and address any questions or concerns that you may have regarding the information given to you following your procedure. If we do not reach you, we will leave a message.  We will attempt to reach you two times.  During this call, we will ask if you have developed any symptoms of COVID 19. If you develop any symptoms (ie: fever, flu-like symptoms, shortness of breath, cough etc.) before then, please call [phone].  If you test positive for Covid 19 in the 2 weeks post procedure, please call and report this information to us.    If any biopsies were taken you will be contacted by phone or by letter within the next 1-3 weeks.  Please call us at [phone] if you have not heard about the biopsies in 3 weeks.    SIGNATURES/CONFIDENTIALITY: You and/or your care partner have signed paperwork which will be entered into your electronic medical record.  These signatures attest to the fact that that the information above on your After Visit Summary has been reviewed and is understood.  Full responsibility of the confidentiality of this discharge information lies with you and/or your care-partner. 

## 2020-09-27 NOTE — Progress Notes (Signed)
Called to room to assist during endoscopic procedure.  Patient ID and intended procedure confirmed with present staff. Received instructions for my participation in the procedure from the performing physician.  

## 2020-09-27 NOTE — Telephone Encounter (Signed)
errror

## 2020-09-29 ENCOUNTER — Ambulatory Visit
Admission: RE | Admit: 2020-09-29 | Discharge: 2020-09-29 | Disposition: A | Discharge status: Home / Self Care | Payer: BC Managed Care – PPO | Source: Ambulatory Visit | Attending: Sports Medicine | Admitting: Sports Medicine

## 2020-09-29 ENCOUNTER — Other Ambulatory Visit: Payer: Self-pay

## 2020-09-29 DIAGNOSIS — M75112 Incomplete rotator cuff tear or rupture of left shoulder, not specified as traumatic: Secondary | ICD-10-CM | POA: Diagnosis not present

## 2020-09-29 DIAGNOSIS — M25512 Pain in left shoulder: Secondary | ICD-10-CM

## 2020-09-29 DIAGNOSIS — M75122 Complete rotator cuff tear or rupture of left shoulder, not specified as traumatic: Secondary | ICD-10-CM | POA: Diagnosis not present

## 2020-09-29 DIAGNOSIS — M25812 Other specified joint disorders, left shoulder: Secondary | ICD-10-CM | POA: Diagnosis not present

## 2020-09-30 ENCOUNTER — Ambulatory Visit: Payer: BC Managed Care – PPO | Attending: Internal Medicine

## 2020-09-30 DIAGNOSIS — Z23 Encounter for immunization: Secondary | ICD-10-CM

## 2020-09-30 NOTE — Progress Notes (Signed)
   Covid-19 Vaccination Clinic  Name:  Leiyah Maultsby    MRN: [id number] DOB: [birth year]  09/30/2020  Ms. Prajapati was observed post Covid-19 immunization for 15 minutes without incident. She was provided with Vaccine Information Sheet and instruction to access the V-Safe system.   Ms. Sorter was instructed to call 911 with any severe reactions post vaccine: Marland Kitchen Difficulty breathing  . Swelling of face and throat  . A fast heartbeat  . A bad rash all over body  . Dizziness and weakness   Immunizations Administered    Name Date Dose VIS Date Route   Pfizer COVID-19 Vaccine 09/30/2020  1:57 PM 0.3 mL 09/05/2020 Intramuscular   Manufacturer: [place]   Lot: [id number]   [place]: [id number]

## 2020-10-01 ENCOUNTER — Telehealth: Payer: Self-pay | Admitting: *Deleted

## 2020-10-01 ENCOUNTER — Telehealth: Payer: Self-pay

## 2020-10-01 NOTE — Telephone Encounter (Signed)
First attempt, left VM.  

## 2020-10-01 NOTE — Telephone Encounter (Signed)
  Called #[phone] and left a message we tried to reach pt for a follow up call. maw

## 2020-10-02 DIAGNOSIS — M7582 Other shoulder lesions, left shoulder: Secondary | ICD-10-CM | POA: Diagnosis not present

## 2020-10-04 ENCOUNTER — Encounter: Payer: Self-pay | Admitting: Internal Medicine

## 2020-10-05 DIAGNOSIS — S46012A Strain of muscle(s) and tendon(s) of the rotator cuff of left shoulder, initial encounter: Secondary | ICD-10-CM | POA: Diagnosis not present

## 2020-10-08 ENCOUNTER — Telehealth: Payer: Self-pay | Admitting: Adult Health

## 2020-10-08 NOTE — Telephone Encounter (Signed)
I called pt and relayed that per Debra in MR, that her fa-x is down.  I relayed to pt that she can have them fax to [phone]. She will let them know.

## 2020-10-08 NOTE — Telephone Encounter (Signed)
Pt called wanting to know if a surgical clearance has been received from [facility] Dr. Mardelle Matte. Please advise.

## 2020-10-15 ENCOUNTER — Telehealth: Payer: Self-pay | Admitting: *Deleted

## 2020-10-15 NOTE — Telephone Encounter (Signed)
Called pt and she really did not see reason to come in for appt or be seen (virtually) as no change sinc last time she was seen.  I relayed that last time we saw her was a year ago and for [place] to complete clearance for upcoming surgery that she would need to be seen (either virtual or in office).  She wanted me to relay that she did not want to come in, did not see reason to come in since no changes.  She will if she needs too but wanted me to ask again.

## 2020-10-15 NOTE — Telephone Encounter (Signed)
Pt called stating that she gave the new fax number to the office and she is wanting to know if the fax has been received at the second fax number that was given. Please advise.

## 2020-10-15 NOTE — Telephone Encounter (Signed)
Called pt and relayed per JM/NP note. She would like a virtual/phone visit please. Placed appt [phone].

## 2020-10-15 NOTE — Telephone Encounter (Signed)
As I have not seen her recently, I am unable to provide clearance without speaking with her. I would also agree to a telephone visit if she refuses OV or VV. She may also further speak to her PCP to possibly provide clearance.

## 2020-10-15 NOTE — Telephone Encounter (Signed)
I called Raliegh Ip, sherri,.  She stated that pt is down to have surgery 11-01-20 for R houlder scope and Rotator cuff repair.  GA with block.  She is on aspirain 81mg  . Last seen 10-07-19, cancelled f/u in 03/2020, and she was to r/s.  Will fax clearance to me at [phone].  May require appt.

## 2020-10-16 ENCOUNTER — Telehealth (INDEPENDENT_AMBULATORY_CARE_PROVIDER_SITE_OTHER): Payer: BC Managed Care – PPO | Admitting: Adult Health

## 2020-10-16 ENCOUNTER — Other Ambulatory Visit: Payer: Self-pay

## 2020-10-16 ENCOUNTER — Encounter: Payer: Self-pay | Admitting: Adult Health

## 2020-10-16 DIAGNOSIS — Z01818 Encounter for other preprocedural examination: Secondary | ICD-10-CM

## 2020-10-16 DIAGNOSIS — I639 Cerebral infarction, unspecified: Secondary | ICD-10-CM

## 2020-10-16 DIAGNOSIS — I6381 Other cerebral infarction due to occlusion or stenosis of small artery: Secondary | ICD-10-CM

## 2020-10-16 NOTE — Progress Notes (Signed)
I agree with the above plan 

## 2020-10-16 NOTE — Telephone Encounter (Addendum)
Clearance for M/W Ortho Spec surgery signed and fax confirmation received. [phone] sherri.  MR.

## 2020-10-16 NOTE — Progress Notes (Signed)
[facility] [address] [place]. [place] [address] [phone]       STROKE FOLLOW-UP NOTE  [name] Date of Birth:  11/26/1945 Medical Record Number:  [id number]    Reason for visit: Surgical clearance w/ stroke hx   Virtual Visit via telephone  I connected with Pam Scott on 10/16/20 at  1:15 PM EST by telephone located in office at [facility] and verified that I am speaking with the correct person using two identifiers who was located at their own home.   Visit scheduled by Oliver Hum, RN who discussed the limitations of evaluation and management by telephone visits and the availability of in person appointments. The patient expressed understanding and agreed to proceed.    HPI:   Today, 10/16/2020, [name] is requesting surgical clearance for to undergo left shoulder rotator cuff repair.  Visit being conducted via telephone per pt request.  Previously seen 1 year ago for stroke follow-up but has not been seen since that time as she was lost to follow-up.  She has been stable from a stroke standpoint without new stroke/TIA symptoms. Reports residual right sided numbness in finger tip and lips but this has been stable without worsening. She reports ongoing compliance with aspirin and atorvastatin for secondary stroke prevention without side effects.  Blood pressure routinely monitored at home which has been stable with this mornings reading 129/85. No concerns at this time.   History provided for reference purposes only Update 10/04/2019 JM: [name] is a 74 year old female who is being seen today for stroke follow-up.  Residual deficits right-sided numbness and fingertip numbness but denies worsening.  She does occasionally have increased pain at fingertips especially with colder weather.  At times this can interfere with daily functioning as she is right-handed.  She is currently on Lyrica for fibromyalgia  prescribed at 75 mg twice daily but currently only takes daily.  Continues on aspirin and atorvastatin for secondary stroke prevention without side effects.  Blood pressure today satisfactory 119/76.  She has annual follow-up with PCP in 2 weeks and will obtain lab work at that time.  No further concerns at this time.  Initial visit 07/11/2019 Dr. Leonie Scott: [name] is a pleasant 74 year old Caucasian lady seen today for initial office follow-up visit following hospital admission for stroke in July 2020.  History is obtained from the patient and review of electronic medical records and I personally reviewed imaging films in PACS.  She is a pleasant 74 year old lady with history of osteoporosis, hypertension, hyperlipidemia and fibromyalgia.  She presented with sudden onset of right face, hand and foot paresthesias and tingling and numbness on 06/07/2019.  She did not think much about it and actually went to sleep that night.  When she woke up she noticed the numbness involving the right face as well as leg which was worse.  She came to the emergency room and neurology was consulted.  She had NIH stroke scale of 1 for sensory disturbance on the right side.  CT scan of the head was unremarkable.  MRI scan showed a dorsolateral left thalamic infarct.  MRI of the brain and neck both did not show any significant large vessel stenosis or occlusion.  Echocardiogram showed normal ejection fraction.  LDL cholesterol was borderline at 77 mg percent and hemoglobin A1c was 6.1.  Patient was taking Lipitor 20 mg daily and dose was increased to 40 mg.  She was placed on dual antiplatelet therapy aspirin and Plavix for  3 weeks and has since stopped Plavix and is on aspirin alone now.  She states she is noticed improvement in right leg numbness and and numbness is improved however she still has some tingling and numbness around her right lips as well as fingers of the right hand.  She states she is tolerating aspirin well without  bruising or bleeding.  Her blood pressures well controlled today it is 116/79.  She recently had some lab work done by her primary physician which included BMP and CBC which were unremarkable.  She has another appointment in 4 months to have annual physical exam as well as lipid profile checked.  She has no new complaints today.       ROS:   14 system review of systems is positive for numbness, tingling only and all other systems negative  PMH:  Past Medical History:  Diagnosis Date  . Arthritis   . Cataract    surgery on Rt eye  . Fibromyalgia   . Glaucoma   . Hyperlipidemia   . Hypertension   . Osteopenia   . Stroke [facility])    July 2020- lips and fingertips numb are only residule effects    Social History:  Social History   Socioeconomic History  . Marital status: Married    Spouse name: Not on file  . Number of children: Not on file  . Years of education: Not on file  . Highest education level: Not on file  Occupational History  . Not on file  Tobacco Use  . Smoking status: Never Smoker  . Smokeless tobacco: Never Used  Vaping Use  . Vaping Use: Never used  Substance and Sexual Activity  . Alcohol use: Yes    Alcohol/week: 5.0 - 7.0 standard drinks    Types: 5 - 7 Standard drinks or equivalent per week  . Drug use: No  . Sexual activity: Yes    Birth control/protection: Post-menopausal  Other Topics Concern  . Not on file  Social History Narrative  . Not on file   Social Determinants of Health   Financial Resource Strain:   . Difficulty of Paying Living Expenses: Not on file  Food Insecurity:   . Worried About Charity fundraiser in the Last Year: Not on file  . Ran Out of Food in the Last Year: Not on file  Transportation Needs:   . Lack of Transportation (Medical): Not on file  . Lack of Transportation (Non-Medical): Not on file  Physical Activity:   . Days of Exercise per Week: Not on file  . Minutes of Exercise per Session: Not on file  Stress:     . Feeling of Stress : Not on file  Social Connections:   . Frequency of Communication with Friends and Family: Not on file  . Frequency of Social Gatherings with Friends and Family: Not on file  . Attends Religious Services: Not on file  . Active Member of Clubs or Organizations: Not on file  . Attends Archivist Meetings: Not on file  . Marital Status: Not on file  Intimate Partner Violence:   . Fear of Current or Ex-Partner: Not on file  . Emotionally Abused: Not on file  . Physically Abused: Not on file  . Sexually Abused: Not on file    Medications:   Current Outpatient Medications on File Prior to Visit  Medication Sig Dispense Refill  . aspirin 81 MG EC tablet Take 1 tablet (81 mg total) by mouth daily.  30 tablet 0  . atorvastatin (LIPITOR) 80 MG tablet Take 1 tablet (80 mg total) by mouth at bedtime. 60 tablet 0  . bimatoprost (LUMIGAN) 0.03 % ophthalmic drops Place 1 drop into both eyes at bedtime.     . Cholecalciferol (VITAMIN D3) 2000 UNITS TABS Take 1 tablet by mouth daily.     Marland Kitchen EPINEPHrine 0.3 mg/0.3 mL IJ SOAJ injection Inject 1 mg into the muscle as directed.    . hydrochlorothiazide (MICROZIDE) 12.5 MG capsule TAKE 1 CAPSULE DAILY 90 capsule 3  . olmesartan (BENICAR) 20 MG tablet Take 1 tablet (20 mg total) by mouth daily. 90 tablet 0  . pregabalin (LYRICA) 75 MG capsule Take 1 capsule (75 mg total) by mouth 2 (two) times daily. 180 capsule 0   No current facility-administered medications on file prior to visit.    Allergies:   Allergies  Allergen Reactions  . Amlodipine     edema  . Simvastatin     myalgias  . Erythromycin Dermatitis    OBJECTIVE: N/A d/t visit type     ASSESSMENT/PLAN: 17 lady with left thalamic infarct in July 2020 secondary to small vessel disease.  Vascular risk factors of hypertension and hyperlipidemia only.  She has some mild residual poststroke paresthesias located right sided lips and right fingertips which has been  stable.  She is requesting surgical clearance to undergo left shoulder rotator cuff repair currently scheduled 11/01/2020   As she has been stable since 05/2019 without new stroke/TIA symptoms, she is cleared to proceed with rotator cuff repair surgery.  Advised her aspirin will be held typically 3 to 5 days prior to procedure and exact timeframe will be determined by her surgeon.  She was advised of a small but acceptable preprocedure risk of stroke while off aspirin therapy.  Restart aspirin immediately after procedure once hemodynamically stable.  Will complete clearance form as requested by Raliegh Ip orthopedic specialist   Follow-up as needed    I spent 15 minutes of non-face-to-face time with patient via telephone.  This included previsit chart review, lab review, study review, order entry, electronic health record documentation, patient education and discussion regarding procedure clearance and further instructions and answered all questions to patient satisfaction  Frann Rider, [facility]  [facility] [address] [place], [place] [address]  Phone [phone] Fax [phone] Note: This document was prepared with digital dictation and possible smart phrase technology. Any transcriptional errors that result from this process are unintentional.

## 2020-10-25 ENCOUNTER — Telehealth: Payer: Self-pay | Admitting: Physician Assistant

## 2020-10-25 NOTE — Telephone Encounter (Signed)
Patient is having surgery next week and would like to get some refills since she will be recovering from surgery. Benicar, and Microzide. [facility]. Thanks

## 2020-10-31 ENCOUNTER — Telehealth: Payer: Self-pay | Admitting: Physician Assistant

## 2020-10-31 NOTE — Telephone Encounter (Signed)
Patient needs a refill on Benicar and Microzide. Patient uses [facility]. Thanks

## 2020-11-01 ENCOUNTER — Telehealth: Payer: Self-pay | Admitting: Physician Assistant

## 2020-11-01 DIAGNOSIS — I10 Essential (primary) hypertension: Secondary | ICD-10-CM

## 2020-11-01 DIAGNOSIS — I6381 Other cerebral infarction due to occlusion or stenosis of small artery: Secondary | ICD-10-CM

## 2020-11-01 DIAGNOSIS — E785 Hyperlipidemia, unspecified: Secondary | ICD-10-CM

## 2020-11-01 MED ORDER — ATORVASTATIN CALCIUM 80 MG PO TABS: 80.0000 mg | ORAL_TABLET | Freq: Every day | ORAL | 0 refills | Status: AC

## 2020-11-01 MED ORDER — OLMESARTAN MEDOXOMIL 20 MG PO TABS: 20.0000 mg | ORAL_TABLET | Freq: Every day | ORAL | 0 refills | Status: DC

## 2020-11-01 MED ORDER — HYDROCHLOROTHIAZIDE 12.5 MG PO CAPS: 12.5000 mg | ORAL_CAPSULE | Freq: Every day | ORAL | 0 refills | Status: AC

## 2020-11-01 NOTE — Telephone Encounter (Signed)
Refill sent to pharmacy. AS, CMA 

## 2020-11-01 NOTE — Telephone Encounter (Signed)
Original messages for med refills never routed to clinical staff (10/25/20 and 10/31/20)  Patient called again today requesting med refills.   Med refill sent to pharmacy. Patient needs apt for medication refills.  AS, CMA

## 2020-11-02 DIAGNOSIS — M7542 Impingement syndrome of left shoulder: Secondary | ICD-10-CM | POA: Diagnosis not present

## 2020-11-02 DIAGNOSIS — S46812A Strain of other muscles, fascia and tendons at shoulder and upper arm level, left arm, initial encounter: Secondary | ICD-10-CM | POA: Diagnosis not present

## 2020-11-02 DIAGNOSIS — Y999 Unspecified external cause status: Secondary | ICD-10-CM | POA: Diagnosis not present

## 2020-11-02 DIAGNOSIS — S43432A Superior glenoid labrum lesion of left shoulder, initial encounter: Secondary | ICD-10-CM | POA: Diagnosis not present

## 2020-11-02 DIAGNOSIS — M7522 Bicipital tendinitis, left shoulder: Secondary | ICD-10-CM | POA: Diagnosis not present

## 2020-11-02 DIAGNOSIS — X58XXXA Exposure to other specified factors, initial encounter: Secondary | ICD-10-CM | POA: Diagnosis not present

## 2020-11-02 DIAGNOSIS — S46012A Strain of muscle(s) and tendon(s) of the rotator cuff of left shoulder, initial encounter: Secondary | ICD-10-CM | POA: Diagnosis not present

## 2020-11-02 DIAGNOSIS — M24112 Other articular cartilage disorders, left shoulder: Secondary | ICD-10-CM | POA: Diagnosis not present

## 2020-11-02 DIAGNOSIS — G8918 Other acute postprocedural pain: Secondary | ICD-10-CM | POA: Diagnosis not present

## 2020-11-02 DIAGNOSIS — M75122 Complete rotator cuff tear or rupture of left shoulder, not specified as traumatic: Secondary | ICD-10-CM | POA: Diagnosis not present

## 2020-11-14 DIAGNOSIS — M24112 Other articular cartilage disorders, left shoulder: Secondary | ICD-10-CM | POA: Diagnosis not present

## 2020-11-14 DIAGNOSIS — M7522 Bicipital tendinitis, left shoulder: Secondary | ICD-10-CM | POA: Diagnosis not present

## 2020-12-12 DIAGNOSIS — M24112 Other articular cartilage disorders, left shoulder: Secondary | ICD-10-CM | POA: Diagnosis not present

## 2020-12-12 DIAGNOSIS — M7522 Bicipital tendinitis, left shoulder: Secondary | ICD-10-CM | POA: Diagnosis not present

## 2020-12-19 DIAGNOSIS — M25612 Stiffness of left shoulder, not elsewhere classified: Secondary | ICD-10-CM | POA: Diagnosis not present

## 2020-12-19 DIAGNOSIS — M75122 Complete rotator cuff tear or rupture of left shoulder, not specified as traumatic: Secondary | ICD-10-CM | POA: Diagnosis not present

## 2020-12-19 DIAGNOSIS — M6281 Muscle weakness (generalized): Secondary | ICD-10-CM | POA: Diagnosis not present

## 2020-12-20 ENCOUNTER — Other Ambulatory Visit: Payer: Self-pay | Admitting: Physician Assistant

## 2020-12-20 ENCOUNTER — Telehealth: Payer: Self-pay | Admitting: Physician Assistant

## 2020-12-20 DIAGNOSIS — M797 Fibromyalgia: Secondary | ICD-10-CM

## 2020-12-20 DIAGNOSIS — Z1231 Encounter for screening mammogram for malignant neoplasm of breast: Secondary | ICD-10-CM

## 2020-12-20 MED ORDER — PREGABALIN 75 MG PO CAPS: 75.0000 mg | ORAL_CAPSULE | Freq: Two times a day (BID) | ORAL | 0 refills | Status: DC

## 2020-12-20 NOTE — Telephone Encounter (Signed)
rx printed, signed by provider and given to front desk to fax to pharmacy. AS, CMA

## 2020-12-24 ENCOUNTER — Telehealth: Payer: Self-pay | Admitting: Physician Assistant

## 2020-12-24 DIAGNOSIS — M75122 Complete rotator cuff tear or rupture of left shoulder, not specified as traumatic: Secondary | ICD-10-CM | POA: Diagnosis not present

## 2020-12-24 DIAGNOSIS — M6281 Muscle weakness (generalized): Secondary | ICD-10-CM | POA: Diagnosis not present

## 2020-12-24 DIAGNOSIS — M25612 Stiffness of left shoulder, not elsewhere classified: Secondary | ICD-10-CM | POA: Diagnosis not present

## 2020-12-24 NOTE — Telephone Encounter (Signed)
Patient called in requesting her Lyrica to be refilled and sent to [facility]. It looks like it was given to fax on February 3rd. It was faxed. Thanks

## 2020-12-24 NOTE — Telephone Encounter (Signed)
Patient left voicemail explaining that prescription was faxed and [facility].

## 2020-12-24 NOTE — Telephone Encounter (Signed)
Medication refill printed on Script paper and given to [place] at front desk for faxing on 12/20/20.

## 2020-12-26 DIAGNOSIS — L648 Other androgenic alopecia: Secondary | ICD-10-CM | POA: Diagnosis not present

## 2020-12-26 DIAGNOSIS — M25612 Stiffness of left shoulder, not elsewhere classified: Secondary | ICD-10-CM | POA: Diagnosis not present

## 2020-12-26 DIAGNOSIS — L258 Unspecified contact dermatitis due to other agents: Secondary | ICD-10-CM | POA: Diagnosis not present

## 2020-12-26 DIAGNOSIS — M6281 Muscle weakness (generalized): Secondary | ICD-10-CM | POA: Diagnosis not present

## 2020-12-26 DIAGNOSIS — M75122 Complete rotator cuff tear or rupture of left shoulder, not specified as traumatic: Secondary | ICD-10-CM | POA: Diagnosis not present

## 2020-12-31 DIAGNOSIS — S83242A Other tear of medial meniscus, current injury, left knee, initial encounter: Secondary | ICD-10-CM | POA: Diagnosis not present

## 2020-12-31 DIAGNOSIS — M75122 Complete rotator cuff tear or rupture of left shoulder, not specified as traumatic: Secondary | ICD-10-CM | POA: Diagnosis not present

## 2020-12-31 DIAGNOSIS — M6281 Muscle weakness (generalized): Secondary | ICD-10-CM | POA: Diagnosis not present

## 2020-12-31 DIAGNOSIS — M25612 Stiffness of left shoulder, not elsewhere classified: Secondary | ICD-10-CM | POA: Diagnosis not present

## 2021-01-02 DIAGNOSIS — M6281 Muscle weakness (generalized): Secondary | ICD-10-CM | POA: Diagnosis not present

## 2021-01-02 DIAGNOSIS — M75122 Complete rotator cuff tear or rupture of left shoulder, not specified as traumatic: Secondary | ICD-10-CM | POA: Diagnosis not present

## 2021-01-02 DIAGNOSIS — M25612 Stiffness of left shoulder, not elsewhere classified: Secondary | ICD-10-CM | POA: Diagnosis not present

## 2021-01-07 DIAGNOSIS — M6281 Muscle weakness (generalized): Secondary | ICD-10-CM | POA: Diagnosis not present

## 2021-01-07 DIAGNOSIS — M75122 Complete rotator cuff tear or rupture of left shoulder, not specified as traumatic: Secondary | ICD-10-CM | POA: Diagnosis not present

## 2021-01-07 DIAGNOSIS — M25612 Stiffness of left shoulder, not elsewhere classified: Secondary | ICD-10-CM | POA: Diagnosis not present

## 2021-01-09 DIAGNOSIS — M25612 Stiffness of left shoulder, not elsewhere classified: Secondary | ICD-10-CM | POA: Diagnosis not present

## 2021-01-09 DIAGNOSIS — M75122 Complete rotator cuff tear or rupture of left shoulder, not specified as traumatic: Secondary | ICD-10-CM | POA: Diagnosis not present

## 2021-01-09 DIAGNOSIS — M6281 Muscle weakness (generalized): Secondary | ICD-10-CM | POA: Diagnosis not present

## 2021-01-11 DIAGNOSIS — L659 Nonscarring hair loss, unspecified: Secondary | ICD-10-CM | POA: Diagnosis not present

## 2021-01-11 DIAGNOSIS — I10 Essential (primary) hypertension: Secondary | ICD-10-CM | POA: Diagnosis not present

## 2021-01-11 DIAGNOSIS — E782 Mixed hyperlipidemia: Secondary | ICD-10-CM | POA: Diagnosis not present

## 2021-01-11 DIAGNOSIS — Z8673 Personal history of transient ischemic attack (TIA), and cerebral infarction without residual deficits: Secondary | ICD-10-CM | POA: Diagnosis not present

## 2021-01-14 DIAGNOSIS — M25612 Stiffness of left shoulder, not elsewhere classified: Secondary | ICD-10-CM | POA: Diagnosis not present

## 2021-01-14 DIAGNOSIS — M6281 Muscle weakness (generalized): Secondary | ICD-10-CM | POA: Diagnosis not present

## 2021-01-14 DIAGNOSIS — M75122 Complete rotator cuff tear or rupture of left shoulder, not specified as traumatic: Secondary | ICD-10-CM | POA: Diagnosis not present

## 2021-01-15 ENCOUNTER — Other Ambulatory Visit: Payer: Self-pay | Admitting: Family Medicine

## 2021-01-15 DIAGNOSIS — Z780 Asymptomatic menopausal state: Secondary | ICD-10-CM

## 2021-01-16 ENCOUNTER — Ambulatory Visit: Payer: BC Managed Care – PPO | Admitting: Physician Assistant

## 2021-01-23 DIAGNOSIS — M6281 Muscle weakness (generalized): Secondary | ICD-10-CM | POA: Diagnosis not present

## 2021-01-23 DIAGNOSIS — M25612 Stiffness of left shoulder, not elsewhere classified: Secondary | ICD-10-CM | POA: Diagnosis not present

## 2021-01-23 DIAGNOSIS — M75122 Complete rotator cuff tear or rupture of left shoulder, not specified as traumatic: Secondary | ICD-10-CM | POA: Diagnosis not present

## 2021-01-25 DIAGNOSIS — M6281 Muscle weakness (generalized): Secondary | ICD-10-CM | POA: Diagnosis not present

## 2021-01-25 DIAGNOSIS — M75122 Complete rotator cuff tear or rupture of left shoulder, not specified as traumatic: Secondary | ICD-10-CM | POA: Diagnosis not present

## 2021-01-25 DIAGNOSIS — M25612 Stiffness of left shoulder, not elsewhere classified: Secondary | ICD-10-CM | POA: Diagnosis not present

## 2021-01-28 DIAGNOSIS — M25612 Stiffness of left shoulder, not elsewhere classified: Secondary | ICD-10-CM | POA: Diagnosis not present

## 2021-01-28 DIAGNOSIS — M6281 Muscle weakness (generalized): Secondary | ICD-10-CM | POA: Diagnosis not present

## 2021-01-28 DIAGNOSIS — M75122 Complete rotator cuff tear or rupture of left shoulder, not specified as traumatic: Secondary | ICD-10-CM | POA: Diagnosis not present

## 2021-01-30 DIAGNOSIS — M25612 Stiffness of left shoulder, not elsewhere classified: Secondary | ICD-10-CM | POA: Diagnosis not present

## 2021-01-30 DIAGNOSIS — M6281 Muscle weakness (generalized): Secondary | ICD-10-CM | POA: Diagnosis not present

## 2021-01-30 DIAGNOSIS — M75122 Complete rotator cuff tear or rupture of left shoulder, not specified as traumatic: Secondary | ICD-10-CM | POA: Diagnosis not present

## 2021-02-06 ENCOUNTER — Ambulatory Visit: Payer: BC Managed Care – PPO

## 2021-02-12 ENCOUNTER — Inpatient Hospital Stay: Admission: RE | Admit: 2021-02-12 | Payer: BC Managed Care – PPO | Source: Ambulatory Visit

## 2021-02-12 DIAGNOSIS — M545 Low back pain, unspecified: Secondary | ICD-10-CM | POA: Diagnosis not present

## 2021-02-19 IMAGING — MR MR SHOULDER*L* W/O CM
5 series · 35 of 40 positions shown · non-contrast
Comparison: None.

CLINICAL DATA: Left shoulder pain for 6 months.

EXAM:
MRI OF THE LEFT SHOULDER WITHOUT CONTRAST
TECHNIQUE: Multiplanar, multisequence MR imaging of the shoulder was performed.
No intravenous contrast was administered.

[Series 3: PD fat-sat · axial · 4.0mm · 0.55mm/px · z∈[-99,+9]mm · 9 of 26 slices shown]
[im 1/26]
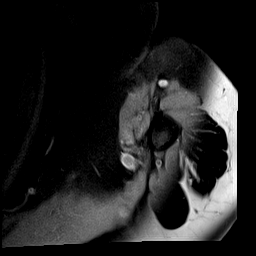
[im 4/26]
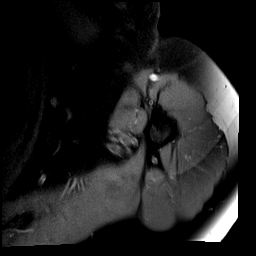
[im 7/26]
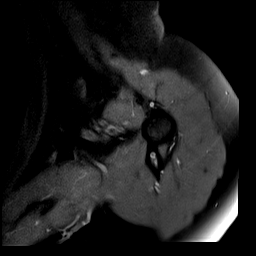
[im 10/26]
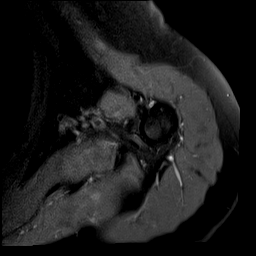
[im 13/26]
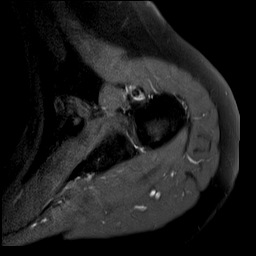
[im 16/26]
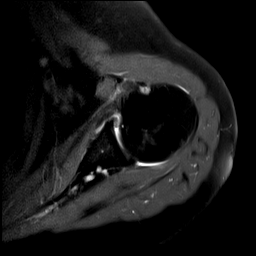
[im 19/26]
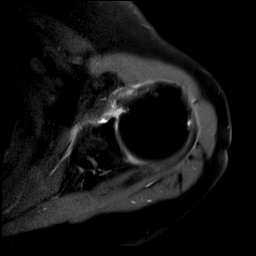
[im 22/26]
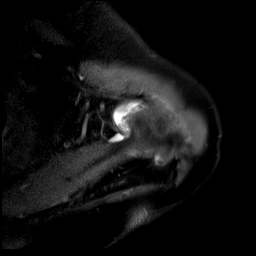
[im 26/26]
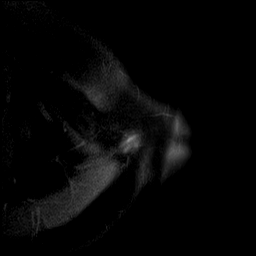

[Series 6: T1 · coronal · 4.0mm · 0.27mm/px · 3 of 23 slices shown]
[im 1/23]
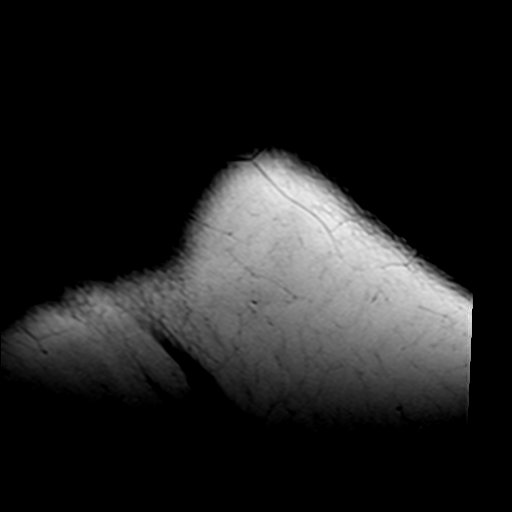
[im 4/23]
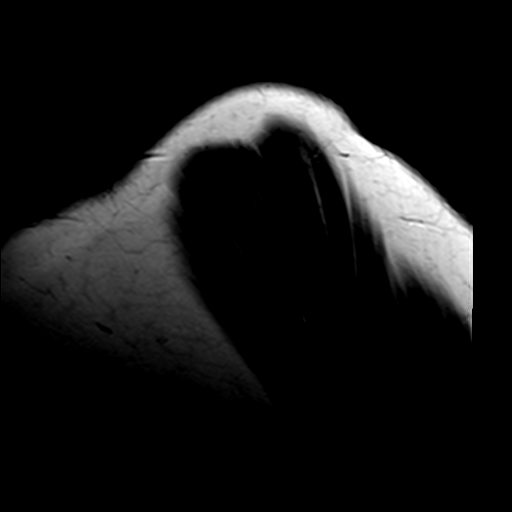
[im 7/23]
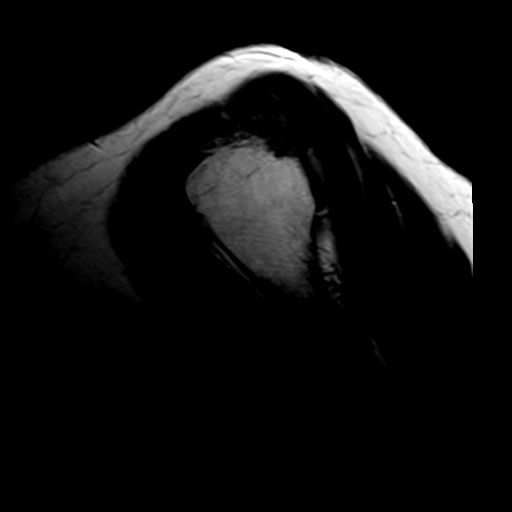

[Series 7: T2 fat-sat · coronal · 4.0mm · 0.55mm/px · 9 of 25 slices shown (1 of 2)]
[im 1/25]
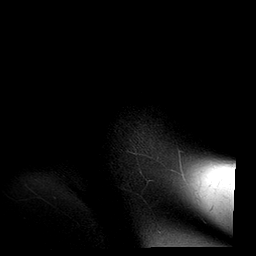
[im 4/25]
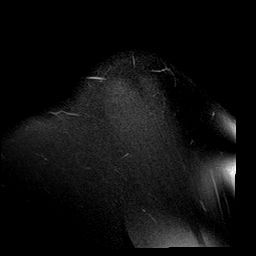
[im 7/25]
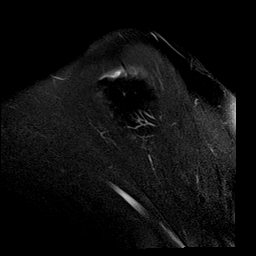
[im 10/25]
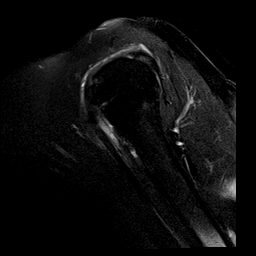
[im 13/25]
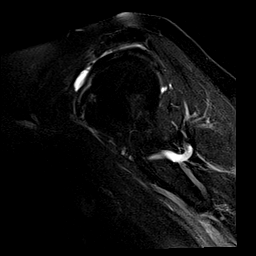
[im 16/25]
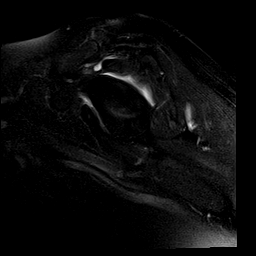
[im 19/25]
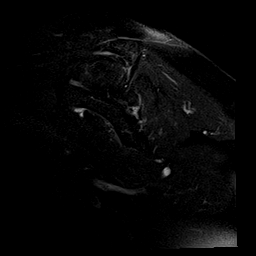
[im 22/25]
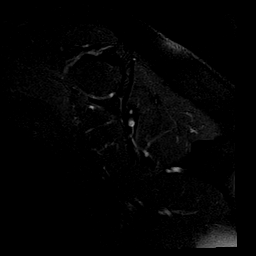
[im 25/25]
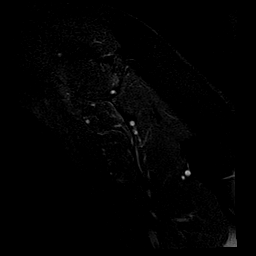

[Series 8: T2 fat-sat · oblique · 4.0mm · 0.55mm/px · 7 of 19 slices shown (2 of 2)]
[im 1/19]
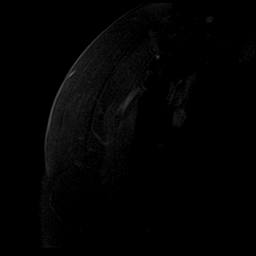
[im 4/19]
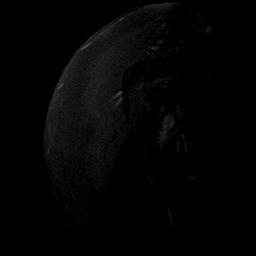
[im 7/19]
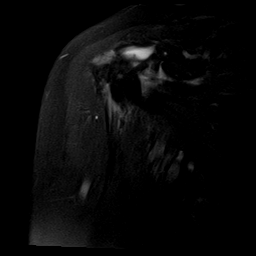
[im 10/19]
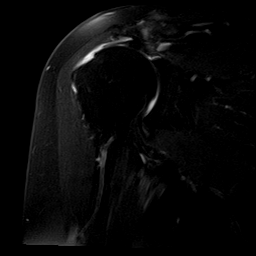
[im 13/19]
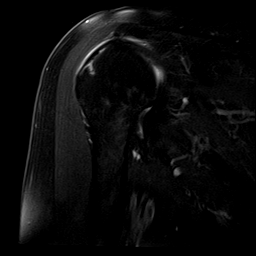
[im 16/19]
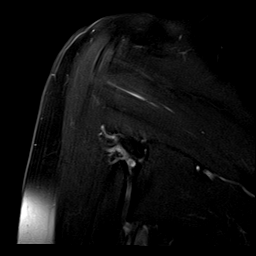
[im 19/19]
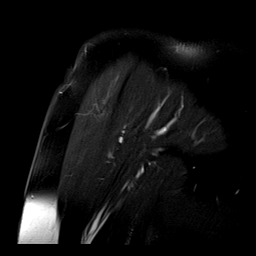

[Series 9: PD · oblique · 4.0mm · 0.27mm/px · 7 of 19 slices shown]
[im 1/19]
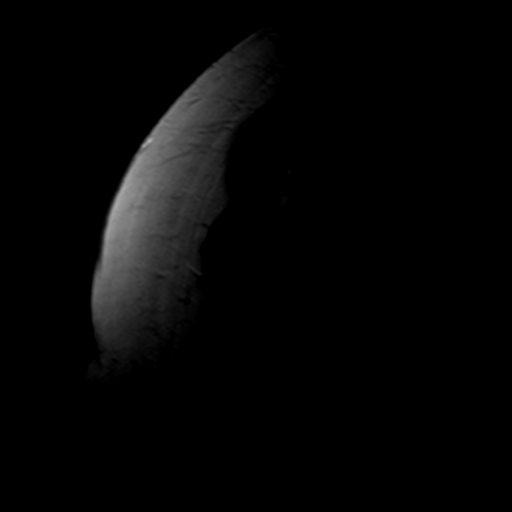
[im 4/19]
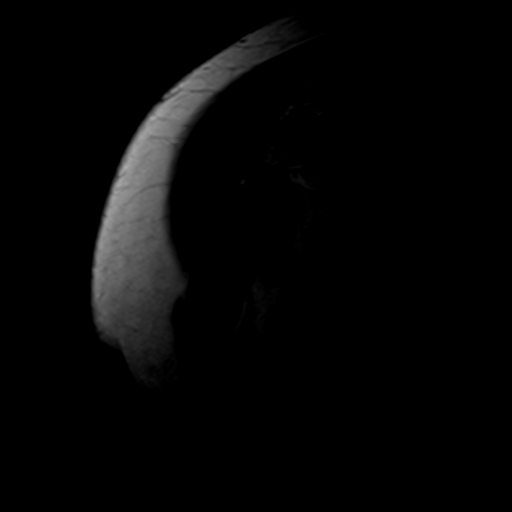
[im 7/19]
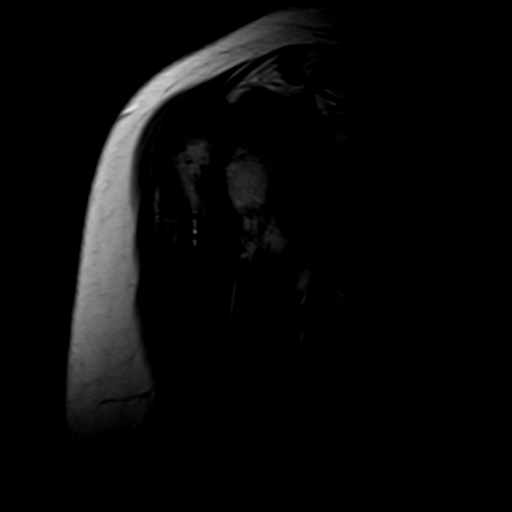
[im 10/19]
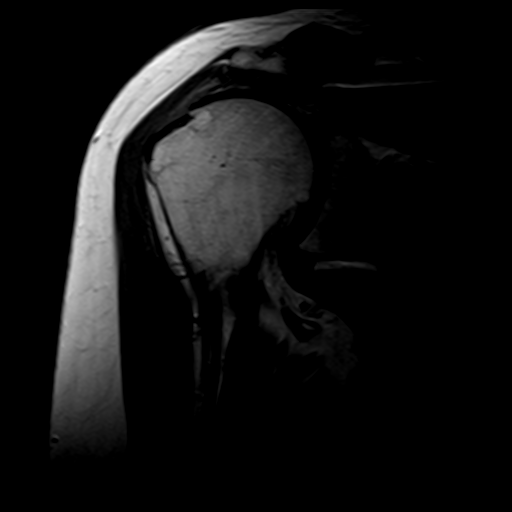
[im 13/19]
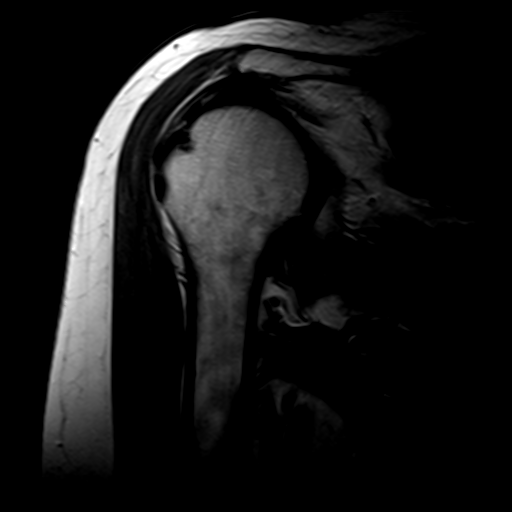
[im 16/19]
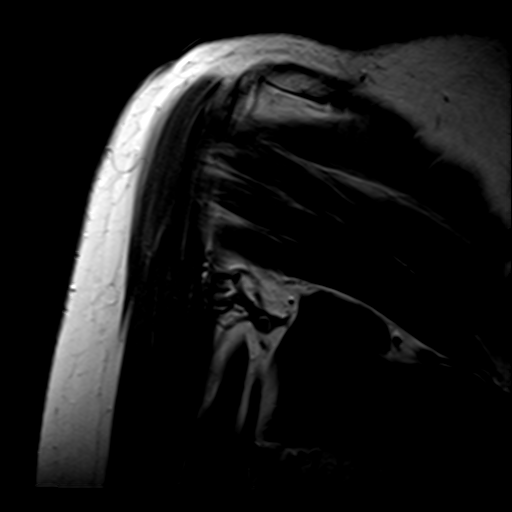
[im 19/19]
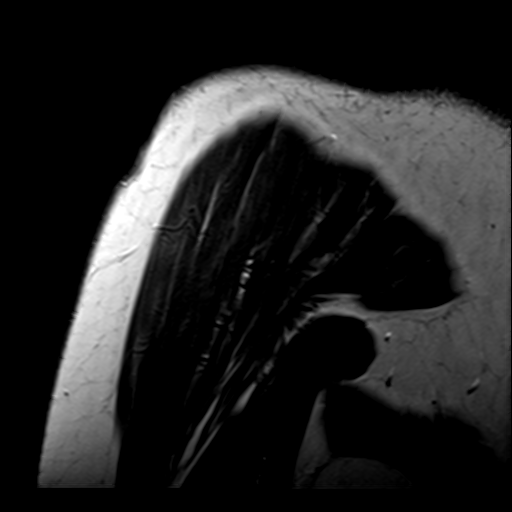

[35 of 40 positions shown; findings below may reference images not displayed]

FINDINGS: Despite efforts by the technologist and patient, motion artifact is
present on today's exam and could not be eliminated. This reduces
exam sensitivity and specificity.

Rotator cuff: Full-thickness full width rupture of the supraspinatus
tendon, with the thin distal margin of the tendon retracted back
about 2.4 cm.

Partial thickness articular surface tearing of the thinned distal
infraspinatus tendon with moderate infraspinatus tendinopathy.

Partial thickness articular surface tearing of the subscapularis
tendon with moderate tendinopathy along the superior margin.

Muscles: No muscular edema or disproportion at atrophy, this
includes the supraspinatus muscle.

Biceps long head: Mildly medially displaced in its intra-articular
segment for example on image 11 of series 3, otherwise unremarkable.

Acromioclavicular Joint: Mild spurring compatible with mild
degenerative AC joint arthropathy. Type II acromion. As expected,
there is communication of glenohumeral joint fluid with the
subacromial subdeltoid bursa.

Glenohumeral Joint: Moderate degenerative chondral thinning. Mild
superior subluxation of the humeral head. Mild spurring of the
humeral head.

Labrum:  Grossly intact.

Bones: No significant extra-articular osseous abnormalities
identified.

Other: No supplemental non-categorized findings.
IMPRESSION: 1. Full-thickness full width rupture of the supraspinatus tendon,
with the thin distal margin of the tendon retracted back about
cm. No supraspinatus muscular atrophy.
2. Partial thickness articular surface tearing of the thinned distal
infraspinatus tendon with moderate infraspinatus tendinopathy.
3. Partial thickness articular surface tearing of the subscapularis
tendon with moderate tendinopathy along the superior margin.
4. Mildly medially displaced long head of the biceps in its
intra-articular segment.
5. Moderate degenerative chondral thinning in the glenohumeral
joint. Mild degenerative AC joint arthropathy.
6. Despite efforts by the technologist and patient, motion artifact
is present on today's exam and could not be eliminated. This reduces
exam sensitivity and specificity.

## 2021-02-20 DIAGNOSIS — M25512 Pain in left shoulder: Secondary | ICD-10-CM | POA: Diagnosis not present

## 2021-03-20 DIAGNOSIS — M1712 Unilateral primary osteoarthritis, left knee: Secondary | ICD-10-CM | POA: Diagnosis not present

## 2021-04-05 ENCOUNTER — Other Ambulatory Visit: Payer: Self-pay

## 2021-04-05 ENCOUNTER — Ambulatory Visit
Admission: RE | Admit: 2021-04-05 | Discharge: 2021-04-05 | Disposition: A | Discharge status: Home / Self Care | Payer: BC Managed Care – PPO | Source: Ambulatory Visit | Attending: Physician Assistant | Admitting: Physician Assistant

## 2021-04-05 DIAGNOSIS — Z1231 Encounter for screening mammogram for malignant neoplasm of breast: Secondary | ICD-10-CM | POA: Diagnosis not present

## 2021-06-12 DIAGNOSIS — M1712 Unilateral primary osteoarthritis, left knee: Secondary | ICD-10-CM | POA: Diagnosis not present

## 2021-07-23 ENCOUNTER — Other Ambulatory Visit: Payer: Self-pay

## 2021-07-23 ENCOUNTER — Ambulatory Visit
Admission: RE | Admit: 2021-07-23 | Discharge: 2021-07-23 | Disposition: A | Discharge status: Home / Self Care | Payer: BC Managed Care – PPO | Source: Ambulatory Visit | Attending: Family Medicine | Admitting: Family Medicine

## 2021-07-23 DIAGNOSIS — M8589 Other specified disorders of bone density and structure, multiple sites: Secondary | ICD-10-CM | POA: Diagnosis not present

## 2021-07-23 DIAGNOSIS — Z780 Asymptomatic menopausal state: Secondary | ICD-10-CM | POA: Diagnosis not present

## 2021-08-09 DIAGNOSIS — Z23 Encounter for immunization: Secondary | ICD-10-CM | POA: Diagnosis not present

## 2021-08-09 DIAGNOSIS — R1084 Generalized abdominal pain: Secondary | ICD-10-CM | POA: Diagnosis not present

## 2021-08-26 ENCOUNTER — Other Ambulatory Visit: Payer: Self-pay | Admitting: Sports Medicine

## 2021-08-26 DIAGNOSIS — M1712 Unilateral primary osteoarthritis, left knee: Secondary | ICD-10-CM | POA: Diagnosis not present

## 2021-08-26 DIAGNOSIS — M25562 Pain in left knee: Secondary | ICD-10-CM

## 2021-08-28 DIAGNOSIS — E782 Mixed hyperlipidemia: Secondary | ICD-10-CM | POA: Diagnosis not present

## 2021-08-28 DIAGNOSIS — E559 Vitamin D deficiency, unspecified: Secondary | ICD-10-CM | POA: Diagnosis not present

## 2021-08-28 DIAGNOSIS — M797 Fibromyalgia: Secondary | ICD-10-CM | POA: Diagnosis not present

## 2021-08-28 DIAGNOSIS — I10 Essential (primary) hypertension: Secondary | ICD-10-CM | POA: Diagnosis not present

## 2021-09-04 ENCOUNTER — Other Ambulatory Visit: Payer: Self-pay

## 2021-09-04 ENCOUNTER — Ambulatory Visit
Admission: RE | Admit: 2021-09-04 | Discharge: 2021-09-04 | Disposition: A | Discharge status: Home / Self Care | Payer: BC Managed Care – PPO | Source: Ambulatory Visit | Attending: Sports Medicine | Admitting: Sports Medicine

## 2021-09-04 DIAGNOSIS — M25562 Pain in left knee: Secondary | ICD-10-CM

## 2021-09-10 DIAGNOSIS — H401132 Primary open-angle glaucoma, bilateral, moderate stage: Secondary | ICD-10-CM | POA: Diagnosis not present

## 2021-09-11 DIAGNOSIS — S83242A Other tear of medial meniscus, current injury, left knee, initial encounter: Secondary | ICD-10-CM | POA: Diagnosis not present

## 2021-09-30 DIAGNOSIS — H401132 Primary open-angle glaucoma, bilateral, moderate stage: Secondary | ICD-10-CM | POA: Diagnosis not present

## 2021-10-15 DIAGNOSIS — J069 Acute upper respiratory infection, unspecified: Secondary | ICD-10-CM | POA: Diagnosis not present

## 2021-10-15 DIAGNOSIS — N3000 Acute cystitis without hematuria: Secondary | ICD-10-CM | POA: Diagnosis not present

## 2021-11-15 DIAGNOSIS — N3000 Acute cystitis without hematuria: Secondary | ICD-10-CM | POA: Diagnosis not present

## 2021-11-15 DIAGNOSIS — R350 Frequency of micturition: Secondary | ICD-10-CM | POA: Diagnosis not present

## 2021-11-15 DIAGNOSIS — R300 Dysuria: Secondary | ICD-10-CM | POA: Diagnosis not present

## 2021-11-29 DIAGNOSIS — M1712 Unilateral primary osteoarthritis, left knee: Secondary | ICD-10-CM | POA: Diagnosis not present

## 2021-12-12 DIAGNOSIS — R350 Frequency of micturition: Secondary | ICD-10-CM | POA: Diagnosis not present

## 2021-12-24 DIAGNOSIS — N390 Urinary tract infection, site not specified: Secondary | ICD-10-CM | POA: Diagnosis not present

## 2021-12-24 DIAGNOSIS — R300 Dysuria: Secondary | ICD-10-CM | POA: Diagnosis not present

## 2021-12-24 DIAGNOSIS — R8279 Other abnormal findings on microbiological examination of urine: Secondary | ICD-10-CM | POA: Diagnosis not present

## 2021-12-24 DIAGNOSIS — N952 Postmenopausal atrophic vaginitis: Secondary | ICD-10-CM | POA: Diagnosis not present

## 2021-12-24 DIAGNOSIS — B9620 Unspecified Escherichia coli [E. coli] as the cause of diseases classified elsewhere: Secondary | ICD-10-CM | POA: Diagnosis not present

## 2022-01-01 DIAGNOSIS — I700 Atherosclerosis of aorta: Secondary | ICD-10-CM | POA: Diagnosis not present

## 2022-01-01 DIAGNOSIS — K5730 Diverticulosis of large intestine without perforation or abscess without bleeding: Secondary | ICD-10-CM | POA: Diagnosis not present

## 2022-01-01 DIAGNOSIS — N3021 Other chronic cystitis with hematuria: Secondary | ICD-10-CM | POA: Diagnosis not present

## 2022-01-01 DIAGNOSIS — N2889 Other specified disorders of kidney and ureter: Secondary | ICD-10-CM | POA: Diagnosis not present

## 2022-01-07 ENCOUNTER — Other Ambulatory Visit: Payer: Self-pay | Admitting: Urology

## 2022-01-07 DIAGNOSIS — N281 Cyst of kidney, acquired: Secondary | ICD-10-CM

## 2022-01-07 DIAGNOSIS — N3021 Other chronic cystitis with hematuria: Secondary | ICD-10-CM

## 2022-01-13 ENCOUNTER — Ambulatory Visit
Admission: RE | Admit: 2022-01-13 | Discharge: 2022-01-13 | Disposition: A | Discharge status: Home / Self Care | Payer: BC Managed Care – PPO | Source: Ambulatory Visit | Attending: Urology | Admitting: Urology

## 2022-01-13 DIAGNOSIS — N261 Atrophy of kidney (terminal): Secondary | ICD-10-CM | POA: Diagnosis not present

## 2022-01-13 DIAGNOSIS — N281 Cyst of kidney, acquired: Secondary | ICD-10-CM | POA: Diagnosis not present

## 2022-01-13 DIAGNOSIS — N3021 Other chronic cystitis with hematuria: Secondary | ICD-10-CM

## 2022-01-13 DIAGNOSIS — N1330 Unspecified hydronephrosis: Secondary | ICD-10-CM | POA: Diagnosis not present

## 2022-01-15 ENCOUNTER — Encounter: Payer: Self-pay | Admitting: Internal Medicine

## 2022-01-15 ENCOUNTER — Ambulatory Visit: Payer: BC Managed Care – PPO | Admitting: Internal Medicine

## 2022-01-15 ENCOUNTER — Other Ambulatory Visit: Payer: Self-pay

## 2022-01-15 ENCOUNTER — Other Ambulatory Visit (HOSPITAL_COMMUNITY): Payer: Self-pay

## 2022-01-15 VITALS — BP 151/88 | HR 60 | Temp 97.6°F | Wt 149.0 lb

## 2022-01-15 DIAGNOSIS — R300 Dysuria: Secondary | ICD-10-CM | POA: Diagnosis not present

## 2022-01-15 MED ORDER — FOSFOMYCIN TROMETHAMINE 3 G PO PACK: 3.0000 g | PACK | ORAL | 0 refills | Status: AC

## 2022-01-15 NOTE — Progress Notes (Signed)
?  ? ? ? ? ?[place] [facility] ? ?Reason for Consult:dysuria ?Referring Provider: [facility] ? ? ? ?Patient Active Problem List  ? Diagnosis Date Noted  ? Anticoagulated 06/28/2019  ? Numbness and tingling of right side of face 06/08/2019  ? Left thalamic infarction ([place]) 06/08/2019  ? Prediabetes- newly Dx Feb 2020 12/29/2018  ? History of adjustment disorder 10/12/2018  ? Vitamin D deficiency 10/12/2018  ? Chronic foot pain, right-and ankle; sees Dr. Paulla Dolly of podiatry  10/12/2018  ? Neuropathic pain-due to fibromyalgia, sees Dr. Lenna Gilford of rheumatology 10/12/2018  ? Long-term current use of opiate analgesic 04/13/2018  ? Allergic contact dermatitis due to plants, except food 03/15/2018  ? Low back pain at multiple sites 01/21/2018  ? Estrogen deficiency 09/16/2016  ? Primary osteoarthritis of right shoulder 04/17/2015  ? Nonspecific abnormal electrocardiogram (ECG) (EKG) 06/28/2014  ? Middle insomnia 06/28/2014  ? Cervical radiculitis 07/06/2013  ? Bradycardia 06/23/2013  ? Fibromyalgia muscle pain 07/28/2012  ? Routine general medical examination at a health care facility 04/19/2012  ? Allergic rhinitis due to other allergen 03/20/2010  ? Hyperlipidemia with target LDL less than 130 11/30/2009  ? DEPRESSIVE DISORDER 11/30/2009  ? Essential hypertension, benign 11/30/2009  ? Osteoporosis 11/30/2009  ? ? ? ? ?HPI: Pam Scott is a 76 y.o. female hx hysterectomy, cystocele repair 2005, glaucoma, htn/hlp, cva, referred here by [facility] for dysuria ? ?I reviewed chart from [facility] dated on 12/24/2021 ?"Hx dysuria. Tx with bactrim in November for "uti" by pcp. Urine cx 11/2021 negative. No uti the past year 2022 (?except that treated by pcp 09/2021). No hx stone. Occasional urgency/frequency but not bothered. Nocturia once a night.  ?GU examination mentioneds mild-mod periurethral atrophy/small cystocele/mild vaginal atrophy. ?Allergic to erythromycin. ?Urine culture 2/7  >100k esbl ecoli (S amikacin, cefotetan, ertapenem, nitrofurantoin, gentamicin, minocycline, piptazo, tetracycline; R tobra, levoflox, bactrim)" ? ?Never had uti infection before ?Televisit 09/2021 with pcp for flu. Discussed "discomfort" more of a burning but not extreme "all the time" but no urgency/frequency. Given bactrim -- no effect ?10/2021 complained again. No culture done. Given amoxicillin.  ?11/2021 revisited pcp visit given bactrim. Urine culture negative. Referred to urology ? ?12/2021 saw urology given estradiol cream (has been using since 12/24/21), but then urine culture esbl ecoli. The estrogen cream hasn't done anything to her sx.  ?CT urogram -- small cyst left kidney. Ultrasound kidney nothing abnormal "no need for follow up." ?She was given then nitrofurantoin by her urologist after the urine culture. Took 1 day but severe nausea/vomiting so stopped.  ? ?Still have sexual intercourse but not since she started. No vaginal discharge. ? ? ?Again sx is rather constant 6/10. No urgency/frequency. When she voids, she voids only a little at a time, but better volume at night (once a night). The sx doesn't get better with voiding but remains the same.  ? ?No blood in urine ?No fever, chill ?No malaise ? ? ?Meds: ?Aspirin ?Atorvastatin ?Latanoprost eye drop ?Pregabalin ?Timolol eye drop ?Trazodone ?Vitamin d3 ? ? ?Review of Systems: ?ROS ? ?All other ros negative ? ? ? ? ?Past Medical History:  ?Diagnosis Date  ? Arthritis   ? Cataract   ? surgery on Rt eye  ? Fibromyalgia   ? Glaucoma   ? Hyperlipidemia   ? Hypertension   ? Osteopenia   ? Stroke [facility])   ? July 2020- lips and fingertips numb are only residule effects  ? ? ?Social History  ? ?  Tobacco Use  ? Smoking status: Never  ? Smokeless tobacco: Never  ?Vaping Use  ? Vaping Use: Never used  ?Substance Use Topics  ? Alcohol use: Yes  ?  Alcohol/week: 5.0 - 7.0 standard drinks  ?  Types: 5 - 7 Standard drinks or equivalent per week  ? Drug use: No   ? ? ?Family History  ?Problem Relation Age of Onset  ? Hypertension Father   ? Hodgkin's lymphoma Mother   ? Cancer Neg Hx   ? Hyperlipidemia Neg Hx   ? Heart disease Neg Hx   ? Diabetes Neg Hx   ? Stroke Neg Hx   ? Colon cancer Neg Hx   ? Esophageal cancer Neg Hx   ? Rectal cancer Neg Hx   ? Stomach cancer Neg Hx   ? ? ?Allergies  ?Allergen Reactions  ? Amlodipine   ?  edema  ? Simvastatin   ?  myalgias  ? Erythromycin Dermatitis  ? ? ?OBJECTIVE: ?Vitals:  ? 01/15/22 1357  ?Weight: 149 lb (67.6 kg)  ? ?Body mass index is 25.58 kg/m?. ? ? ?Physical Exam ?General/constitutional: no distress, pleasant ?HEENT: Normocephalic, PER, Conj Clear, EOMI, Oropharynx clear ?Neck supple ?CV: rrr no mrg ?Lungs: clear to auscultation, normal respiratory effort ?Abd: Soft, Nontender ?Ext: no edema ?Skin: No Rash ?Neuro: nonfocal ?MSK: no peripheral joint swelling/tenderness/warmth; back spines nontender ?Psych: alert/oriented ? ? ?Lab: ? ?Microbiology: ? ?Serology: ? ?Imaging: ? ? ?Assessment/plan: ?Problem List Items Addressed This Visit   ?None ?Visit Diagnoses   ? ? Dysuria    -  Primary  ? Relevant Orders  ? Urinalysis, Routine w reflex microscopic  ? Urine Culture  ? ?  ? ?Reviewed sx of uti with patient, and this doesn't appear to be one, especially with the duration of sx that she has had for more than 3 months without any ascending or systemic s/s of sepsis otherwise ? ?Discussed the common phenomenom of assymptomatic bacteriuria/colonization ? ?Discussed alternative noninfectious explanation to her sx such as IC/mucosal atrophy ? ?I do not think this is viral process or other atypical bacterial process  ? ? ?-I will repeat urinalysis and cx and trial of fosfomycin just to completely take ID related variables out of the equation ?-fosfomycin 3 gram q72 hours x3 doses ?-ua with reflex ?-f/u with urology; consider gynecologic evaluation as well ? ? ? ? ? ? ? ? ? ?Follow-up: Return if symptoms worsen or fail to  improve. ? ?Jabier Mutton, MD ?[facility] ?[place] ?[phone] pager   [phone] cell ?01/15/2022, 2:07 PM ? ?

## 2022-01-15 NOTE — Patient Instructions (Signed)
This doesn't appear to be uti, but will get diagnostics and give you an appropriate antibiotics trial to definitively rule this out ? ?I suspect if symptom is persistent you'll have to further discuss with your urologist or gynecologist about how to manage the symptoms  ? ? ? ?

## 2022-01-16 LAB — URINALYSIS, ROUTINE W REFLEX MICROSCOPIC
Bacteria, UA: NONE SEEN /HPF
Bilirubin Urine: NEGATIVE
Glucose, UA: NEGATIVE
Hgb urine dipstick: NEGATIVE
Hyaline Cast: NONE SEEN /LPF
Ketones, ur: NEGATIVE
Nitrite: NEGATIVE
Protein, ur: NEGATIVE
RBC / HPF: NONE SEEN /HPF (ref 0–2)
Specific Gravity, Urine: 1.011 (ref 1.001–1.035)
Squamous Epithelial / HPF: NONE SEEN /HPF (ref <=5)
WBC, UA: NONE SEEN /HPF (ref 0–5)
pH: 6 (ref 5.0–8.0)

## 2022-01-16 LAB — URINE CULTURE
MICRO NUMBER:: 13074298
SPECIMEN QUALITY:: ADEQUATE

## 2022-01-16 LAB — MICROSCOPIC MESSAGE

## 2022-01-17 ENCOUNTER — Encounter: Payer: Self-pay | Admitting: Internal Medicine

## 2022-01-22 DIAGNOSIS — Z23 Encounter for immunization: Secondary | ICD-10-CM | POA: Diagnosis not present

## 2022-01-22 DIAGNOSIS — E559 Vitamin D deficiency, unspecified: Secondary | ICD-10-CM | POA: Diagnosis not present

## 2022-01-22 DIAGNOSIS — Z0000 Encounter for general adult medical examination without abnormal findings: Secondary | ICD-10-CM | POA: Diagnosis not present

## 2022-01-22 DIAGNOSIS — E782 Mixed hyperlipidemia: Secondary | ICD-10-CM | POA: Diagnosis not present

## 2022-02-06 DIAGNOSIS — M1712 Unilateral primary osteoarthritis, left knee: Secondary | ICD-10-CM | POA: Diagnosis not present

## 2022-03-25 DIAGNOSIS — M1712 Unilateral primary osteoarthritis, left knee: Secondary | ICD-10-CM | POA: Diagnosis not present

## 2022-03-31 DIAGNOSIS — M1712 Unilateral primary osteoarthritis, left knee: Secondary | ICD-10-CM | POA: Diagnosis not present

## 2022-04-07 ENCOUNTER — Other Ambulatory Visit: Payer: Self-pay | Admitting: Family Medicine

## 2022-04-07 DIAGNOSIS — Z1231 Encounter for screening mammogram for malignant neoplasm of breast: Secondary | ICD-10-CM

## 2022-04-10 ENCOUNTER — Telehealth: Payer: Self-pay | Admitting: *Deleted

## 2022-04-10 NOTE — Telephone Encounter (Signed)
Received fax from [place] re: surgical clearance for left total knee replacement, scheduled for 06/19/22.  Patient last seen 10/16/20, placed on NP's desk for review.

## 2022-04-15 NOTE — Telephone Encounter (Signed)
Patient has not been seen since 2021 and advised f/u as needed as stable. Her stroke occurred in 05/2019. She would need to be scheduled for a f/u visit if stroke clearance is required prior to procedure

## 2022-04-15 NOTE — Telephone Encounter (Signed)
Called patient and informed her Pam Scott will need to see her for FU before giving surgical clearance. She is currently out of town, stated she will call back when she is home and will schedule it. She prefers video visit. Patient verbalized understanding, appreciation.

## 2022-04-23 ENCOUNTER — Ambulatory Visit: Payer: BC Managed Care – PPO

## 2022-04-28 NOTE — Telephone Encounter (Signed)
Preferably an in office visit is preferred in order to do appropriate neurological examination but if she refuses or out of town, can proceed with virtual visit. Again though, she can further discuss this with her surgeon and PCP as they may be able to provide clearance.

## 2022-04-28 NOTE — Telephone Encounter (Signed)
FYI: Pt called to f/u from last phone call. She is still  unsure why she needs a f/u for surgical clarence as she has been stable and her stroke occurred so long ago. Pt agreed to do f/u visit in order to proceed in having her surgery that's sch for 06/19/22.   Pt sch for a virtual f/u on 06/09/22 '@3'$ :30. Informed I would let the RN know of appt and to check forms are still available.   No need to return call unless we do not have forms or in person visit is preferred.

## 2022-04-28 NOTE — Telephone Encounter (Signed)
Called patient and reviewed NP's notes, recommendations. She stated she has left 3 VM for surgeon's office asking why they need neuro clearance. She stated she will keep VM, put her on wait list for in office visit. If her PCP agrees to give clearance she will call us and cancel VV. Patient verbalized understanding, appreciation.

## 2022-04-28 NOTE — Telephone Encounter (Signed)
I am personally unable to clear someone for surgery from a stroke standpoint when they have not been seen since 2021. She can speak with her surgeon to see if neurology clearance from a stroke standpoint is actually needed or if they are only requesting recommendations for blood thinner medication.  If only for medication, this can be completed by PCP who is currently prescribing.   In regards to the form, it was placed in outbox last week noting need for visit if form needed. Pam Scott or Pam Scott may have it.

## 2022-05-15 ENCOUNTER — Ambulatory Visit
Admission: RE | Admit: 2022-05-15 | Discharge: 2022-05-15 | Disposition: A | Discharge status: Home / Self Care | Payer: BC Managed Care – PPO | Source: Ambulatory Visit | Attending: Family Medicine | Admitting: Family Medicine

## 2022-05-15 DIAGNOSIS — Z1231 Encounter for screening mammogram for malignant neoplasm of breast: Secondary | ICD-10-CM

## 2022-05-19 ENCOUNTER — Other Ambulatory Visit: Payer: Self-pay | Admitting: Family Medicine

## 2022-05-19 DIAGNOSIS — R928 Other abnormal and inconclusive findings on diagnostic imaging of breast: Secondary | ICD-10-CM

## 2022-05-27 ENCOUNTER — Ambulatory Visit
Admission: RE | Admit: 2022-05-27 | Discharge: 2022-05-27 | Disposition: A | Discharge status: Home / Self Care | Payer: BC Managed Care – PPO | Source: Ambulatory Visit | Attending: Family Medicine | Admitting: Family Medicine

## 2022-05-27 ENCOUNTER — Ambulatory Visit: Payer: BC Managed Care – PPO

## 2022-05-27 DIAGNOSIS — R928 Other abnormal and inconclusive findings on diagnostic imaging of breast: Secondary | ICD-10-CM

## 2022-05-29 ENCOUNTER — Encounter: Payer: Self-pay | Admitting: Adult Health

## 2022-05-29 ENCOUNTER — Ambulatory Visit: Payer: BC Managed Care – PPO | Admitting: Adult Health

## 2022-05-29 VITALS — BP 101/60 | HR 72 | Ht 64.0 in | Wt 139.0 lb

## 2022-05-29 DIAGNOSIS — I6381 Other cerebral infarction due to occlusion or stenosis of small artery: Secondary | ICD-10-CM

## 2022-05-29 DIAGNOSIS — Z01818 Encounter for other preprocedural examination: Secondary | ICD-10-CM | POA: Diagnosis not present

## 2022-05-29 NOTE — Progress Notes (Signed)
[facility] [address] [place]. [place] [address] [phone]       OFFICE FOLLOW-UP NOTE  [name] Date of Birth:  1946-06-02 Medical Record Number:  [id number]   Chief Complaint  Patient presents with   Thalamic infarction    Rm 2, FU for surgical clearance       HPI:   Initial visit 07/11/2019 Dr. Leonie Man: Pam Scott is a pleasant 76 year old Caucasian lady seen today for initial office follow-up visit following hospital admission for stroke in July 2020.  History is obtained from the patient and review of electronic medical records and I personally reviewed imaging films in PACS.  She is a pleasant 76 year old lady with history of osteoporosis, hypertension, hyperlipidemia and fibromyalgia.  She presented with sudden onset of right face, hand and foot paresthesias and tingling and numbness on 06/07/2019.  She did not think much about it and actually went to sleep that night.  When she woke up she noticed the numbness involving the right face as well as leg which was worse.  She came to the emergency room and neurology was consulted.  She had NIH stroke scale of 1 for sensory disturbance on the right side.  CT scan of the head was unremarkable.  MRI scan showed a dorsolateral left thalamic infarct.  MRI of the brain and neck both did not show any significant large vessel stenosis or occlusion.  Echocardiogram showed normal ejection fraction.  LDL cholesterol was borderline at 77 mg percent and hemoglobin A1c was 6.1.  Patient was taking Lipitor 20 mg daily and dose was increased to 40 mg.  She was placed on dual antiplatelet therapy aspirin and Plavix for 3 weeks and has since stopped Plavix and is on aspirin alone now.  She states she is noticed improvement in right leg numbness and and numbness is improved however she still has some tingling and numbness around her right lips as well as fingers of the right hand.  She states she is tolerating aspirin well  without bruising or bleeding.  Her blood pressures well controlled today it is 116/79.  She recently had some lab work done by her primary physician which included BMP and CBC which were unremarkable.  She has another appointment in 4 months to have annual physical exam as well as lipid profile checked.  She has no new complaints today.   Update 10/04/2019: Pam Scott is a 76 year old female who is being seen today for stroke follow-up.  Residual deficits right-sided numbness and fingertip numbness but denies worsening.  She does occasionally have increased pain at fingertips especially with colder weather.  At times this can interfere with daily functioning as she is right-handed.  She is currently on Lyrica for fibromyalgia prescribed at 75 mg twice daily but currently only takes daily.  Continues on aspirin and atorvastatin for secondary stroke prevention without side effects.  Blood pressure today satisfactory 119/76.  She has annual follow-up with PCP in 2 weeks and will obtain lab work at that time.  No further concerns at this time.   Update 05/29/2022 JM: patient returns for surgical clearance for left total knee replacement scheduled on 06/19/2022. Has been doing well from stroke standpoint since prior visit. Denies any new stroke/TIA symptoms.  Reports residual mild right hand numbness which has been persistent since her prior stroke and can worsen with cold weather.  Compliant on aspirin and atorvastatin, denies side effects. Blood pressure today 101/60. She has been experiencing left foot nerve  type pain over the past couple of months which can happen randomly and typically be present at either side of the foot or the top of her foot.  She has also had some lower back pain and recently seen by chiropractor with some improvement.  No new concerns at this time.      ROS:   14 system review of systems is positive for those listed in HPI only and all other systems negative  PMH:  Past Medical  History:  Diagnosis Date   Arthritis    Cataract    surgery on Rt eye   Fibromyalgia    Glaucoma    Hyperlipidemia    Hypertension    Osteopenia    Stroke [facility])    July 2020- lips and fingertips numb are only residule effects    Social History:  Social History   Socioeconomic History   Marital status: Married    Spouse name: Not on file   Number of children: Not on file   Years of education: Not on file   Highest education level: Not on file  Occupational History   Not on file  Tobacco Use   Smoking status: Never   Smokeless tobacco: Never  Vaping Use   Vaping Use: Never used  Substance and Sexual Activity   Alcohol use: Yes    Alcohol/week: 5.0 - 7.0 standard drinks of alcohol    Types: 5 - 7 Standard drinks or equivalent per week   Drug use: No   Sexual activity: Yes    Birth control/protection: Post-menopausal  Other Topics Concern   Not on file  Social History Narrative   Not on file   Social Determinants of Health   Financial Resource Strain: Not on file  Food Insecurity: Not on file  Transportation Needs: Not on file  Physical Activity: Not on file  Stress: Not on file  Social Connections: Not on file  Intimate Partner Violence: Not on file    Medications:   Current Outpatient Medications on File Prior to Visit  Medication Sig Dispense Refill   aspirin 81 MG EC tablet Take 1 tablet (81 mg total) by mouth daily. 30 tablet 0   atorvastatin (LIPITOR) 80 MG tablet Take 1 tablet (80 mg total) by mouth at bedtime. 90 tablet 0   Cholecalciferol (VITAMIN D3) 2000 UNITS TABS Take 1 tablet by mouth daily.      EPINEPHrine 0.3 mg/0.3 mL IJ SOAJ injection Inject 1 mg into the muscle as directed.     latanoprost (XALATAN) 0.005 % ophthalmic solution SMARTSIG:1 Drop(s) In Eye(s) Every Evening     MOUNJARO 5 MG/0.5ML Pen SMARTSIG:0.5 Milliliter(s) SUB-Q Once a Week     olmesartan-hydrochlorothiazide (BENICAR HCT) 20-12.5 MG tablet 1 tablet     pregabalin (LYRICA)  75 MG capsule 1 capsule     timolol (TIMOPTIC) 0.5 % ophthalmic solution 1 drop every morning.     traZODone (DESYREL) 50 MG tablet 1 tablet at bedtime as needed     hydrochlorothiazide (MICROZIDE) 12.5 MG capsule Take 1 capsule (12.5 mg total) by mouth daily. (Patient not taking: Reported on 01/15/2022) 90 capsule 0   No current facility-administered medications on file prior to visit.    Allergies:   Allergies  Allergen Reactions   Amlodipine     edema   Simvastatin     myalgias   Erythromycin Dermatitis    hives    Physical Exam  Today's Vitals   05/29/22 1405  BP: 101/60  Pulse:  72  Weight: 139 lb (63 kg)  Height: '5\' 4"'$  (1.626 m)    Body mass index is 23.86 kg/m.   General: Very pleasant elderly Caucasian lady, seated, in no evident distress Head: head normocephalic and atraumatic.  Neck: supple with no carotid or supraclavicular bruits Cardiovascular: regular rate and rhythm, no murmurs Musculoskeletal: no deformity Skin:  no rash/petichiae Vascular:  Normal pulses all extremities  Neurologic Exam Mental Status: Awake and fully alert.  Fluent speech and language.  Oriented to place and time. Recent and remote memory intact. Attention span, concentration and fund of knowledge appropriate. Mood and affect appropriate.  Cranial Nerves: Pupils equal, briskly reactive to light. Extraocular movements full without nystagmus. Visual fields full to confrontation. Hearing intact. Facial sensation intact. Face, tongue, palate moves normally and symmetrically  Motor: Normal bulk and tone. Normal strength in all tested extremity muscles. Sensory.: intact to touch ,pinprick .position and vibratory sensation.  Subjective mild right hand numbness Coordination: Rapid alternating movements normal in all extremities. Finger-to-nose and heel-to-shin performed accurately bilaterally. Gait and Station: Arises from chair with mild difficulty. Stance is normal. Gait demonstrates normal  stride length although moderate favoring of left knee.  Tandem walking heel toe not Reflexes: 1+ and symmetric. Toes downgoing.      ASSESSMENT/PLAN: 11 lady with left thalamic infarct in July 2020 secondary to small vessel disease with residual mild right hand numbness.  Vascular risk factors of hypertension and hyperlipidemia only.  Returns today, 05/29/2022, for surgical clearance to undergo left total knee replacement currently scheduled on 8/3    -As her stroke occurred 3 years ago and stable since that time, she is cleared to proceed with surgical procedure from a stroke standpoint. If aspirin is needing to be held, recommend holding for 3 to 5 days prior with small but acceptable risk of preprocedural stroke while off therapy and restart immediately after or once stable.  Patient verbalized understanding. Will complete clearance form and fax back to [facility] as requested  -left foot and back pain likely related to knee pain. Hopefully after replacement and with PT, these will improve.   -Continue aspirin 81 mg daily and atorvastatin 40 mg daily for secondary stroke prevention -Continue to follow with PCP for aggressive stroke risk factor management including BP goal<130/90 and HLD with LDL goal<70    Follow-up as needed   I spent 23 minutes of face-to-face and non-face-to-face time with patient.  This included previsit chart review, lab review, study review, electronic health record documentation, patient education and discussion regarding surgical clearance with history of stroke with residual deficit, secondary stroke prevention measures and aggressive stroke risk factor management and other discussion as noted above and answered all other questions to patients satisfaction.    Frann Rider, AGNP-BC  [facility] [address] [place], [place] [address]  Phone [phone] Fax [phone] Note: This document was prepared with digital  dictation and possible smart phrase technology. Any transcriptional errors that result from this process are unintentional.

## 2022-05-29 NOTE — Telephone Encounter (Signed)
Completed and placed in outbox.  Please fax back to [facility] as requested.  Thank you.

## 2022-05-29 NOTE — Telephone Encounter (Signed)
Faxed back to [employer] [facility] [employer], confirmation received.

## 2022-06-02 DIAGNOSIS — M25562 Pain in left knee: Secondary | ICD-10-CM | POA: Diagnosis not present

## 2022-06-02 DIAGNOSIS — M1712 Unilateral primary osteoarthritis, left knee: Secondary | ICD-10-CM | POA: Diagnosis not present

## 2022-06-02 DIAGNOSIS — Z01812 Encounter for preprocedural laboratory examination: Secondary | ICD-10-CM | POA: Diagnosis not present

## 2022-06-09 ENCOUNTER — Telehealth: Payer: BC Managed Care – PPO | Admitting: Adult Health

## 2022-06-16 DIAGNOSIS — M1712 Unilateral primary osteoarthritis, left knee: Secondary | ICD-10-CM | POA: Diagnosis not present

## 2022-06-19 DIAGNOSIS — G8918 Other acute postprocedural pain: Secondary | ICD-10-CM | POA: Diagnosis not present

## 2022-06-19 DIAGNOSIS — M1712 Unilateral primary osteoarthritis, left knee: Secondary | ICD-10-CM | POA: Diagnosis not present

## 2022-08-29 DIAGNOSIS — Z23 Encounter for immunization: Secondary | ICD-10-CM | POA: Diagnosis not present

## 2022-09-01 DIAGNOSIS — H401132 Primary open-angle glaucoma, bilateral, moderate stage: Secondary | ICD-10-CM | POA: Diagnosis not present

## 2022-09-01 DIAGNOSIS — H2512 Age-related nuclear cataract, left eye: Secondary | ICD-10-CM | POA: Diagnosis not present

## 2022-09-22 DIAGNOSIS — H401132 Primary open-angle glaucoma, bilateral, moderate stage: Secondary | ICD-10-CM | POA: Diagnosis not present

## 2022-09-24 DIAGNOSIS — Z96652 Presence of left artificial knee joint: Secondary | ICD-10-CM | POA: Diagnosis not present

## 2022-09-24 DIAGNOSIS — R269 Unspecified abnormalities of gait and mobility: Secondary | ICD-10-CM | POA: Diagnosis not present

## 2022-09-24 DIAGNOSIS — M6281 Muscle weakness (generalized): Secondary | ICD-10-CM | POA: Diagnosis not present

## 2022-09-30 DIAGNOSIS — M6281 Muscle weakness (generalized): Secondary | ICD-10-CM | POA: Diagnosis not present

## 2022-09-30 DIAGNOSIS — Z96652 Presence of left artificial knee joint: Secondary | ICD-10-CM | POA: Diagnosis not present

## 2022-09-30 DIAGNOSIS — R269 Unspecified abnormalities of gait and mobility: Secondary | ICD-10-CM | POA: Diagnosis not present

## 2022-10-08 DIAGNOSIS — Z96652 Presence of left artificial knee joint: Secondary | ICD-10-CM | POA: Diagnosis not present

## 2022-10-08 DIAGNOSIS — M6281 Muscle weakness (generalized): Secondary | ICD-10-CM | POA: Diagnosis not present

## 2022-10-08 DIAGNOSIS — R269 Unspecified abnormalities of gait and mobility: Secondary | ICD-10-CM | POA: Diagnosis not present

## 2022-11-05 DIAGNOSIS — L718 Other rosacea: Secondary | ICD-10-CM | POA: Diagnosis not present

## 2022-11-05 DIAGNOSIS — D225 Melanocytic nevi of trunk: Secondary | ICD-10-CM | POA: Diagnosis not present

## 2022-11-05 DIAGNOSIS — Z1283 Encounter for screening for malignant neoplasm of skin: Secondary | ICD-10-CM | POA: Diagnosis not present

## 2022-11-05 DIAGNOSIS — D485 Neoplasm of uncertain behavior of skin: Secondary | ICD-10-CM | POA: Diagnosis not present

## 2022-11-05 DIAGNOSIS — L308 Other specified dermatitis: Secondary | ICD-10-CM | POA: Diagnosis not present

## 2022-11-13 DIAGNOSIS — H25042 Posterior subcapsular polar age-related cataract, left eye: Secondary | ICD-10-CM | POA: Diagnosis not present

## 2022-11-13 DIAGNOSIS — H2512 Age-related nuclear cataract, left eye: Secondary | ICD-10-CM | POA: Diagnosis not present

## 2022-11-13 DIAGNOSIS — H25812 Combined forms of age-related cataract, left eye: Secondary | ICD-10-CM | POA: Diagnosis not present

## 2022-11-13 DIAGNOSIS — H401122 Primary open-angle glaucoma, left eye, moderate stage: Secondary | ICD-10-CM | POA: Diagnosis not present

## 2022-11-13 DIAGNOSIS — H25012 Cortical age-related cataract, left eye: Secondary | ICD-10-CM | POA: Diagnosis not present

## 2022-12-19 DIAGNOSIS — R42 Dizziness and giddiness: Secondary | ICD-10-CM | POA: Diagnosis not present

## 2022-12-19 DIAGNOSIS — I10 Essential (primary) hypertension: Secondary | ICD-10-CM | POA: Diagnosis not present

## 2022-12-19 DIAGNOSIS — Z8673 Personal history of transient ischemic attack (TIA), and cerebral infarction without residual deficits: Secondary | ICD-10-CM | POA: Diagnosis not present

## 2022-12-19 DIAGNOSIS — E559 Vitamin D deficiency, unspecified: Secondary | ICD-10-CM | POA: Diagnosis not present

## 2022-12-19 DIAGNOSIS — R5383 Other fatigue: Secondary | ICD-10-CM | POA: Diagnosis not present

## 2022-12-19 DIAGNOSIS — R2689 Other abnormalities of gait and mobility: Secondary | ICD-10-CM | POA: Diagnosis not present

## 2022-12-31 DIAGNOSIS — Z96652 Presence of left artificial knee joint: Secondary | ICD-10-CM | POA: Diagnosis not present

## 2022-12-31 DIAGNOSIS — E538 Deficiency of other specified B group vitamins: Secondary | ICD-10-CM | POA: Diagnosis not present

## 2022-12-31 DIAGNOSIS — Z03818 Encounter for observation for suspected exposure to other biological agents ruled out: Secondary | ICD-10-CM | POA: Diagnosis not present

## 2022-12-31 DIAGNOSIS — J029 Acute pharyngitis, unspecified: Secondary | ICD-10-CM | POA: Diagnosis not present

## 2022-12-31 DIAGNOSIS — I10 Essential (primary) hypertension: Secondary | ICD-10-CM | POA: Diagnosis not present

## 2023-01-23 DIAGNOSIS — E538 Deficiency of other specified B group vitamins: Secondary | ICD-10-CM | POA: Diagnosis not present

## 2023-01-30 DIAGNOSIS — E538 Deficiency of other specified B group vitamins: Secondary | ICD-10-CM | POA: Diagnosis not present

## 2023-02-06 DIAGNOSIS — E538 Deficiency of other specified B group vitamins: Secondary | ICD-10-CM | POA: Diagnosis not present

## 2023-02-12 DIAGNOSIS — E538 Deficiency of other specified B group vitamins: Secondary | ICD-10-CM | POA: Diagnosis not present

## 2023-04-30 DIAGNOSIS — Z8673 Personal history of transient ischemic attack (TIA), and cerebral infarction without residual deficits: Secondary | ICD-10-CM | POA: Diagnosis not present

## 2023-04-30 DIAGNOSIS — E538 Deficiency of other specified B group vitamins: Secondary | ICD-10-CM | POA: Diagnosis not present

## 2023-04-30 DIAGNOSIS — Z0000 Encounter for general adult medical examination without abnormal findings: Secondary | ICD-10-CM | POA: Diagnosis not present

## 2023-04-30 DIAGNOSIS — I10 Essential (primary) hypertension: Secondary | ICD-10-CM | POA: Diagnosis not present

## 2023-04-30 DIAGNOSIS — Z1159 Encounter for screening for other viral diseases: Secondary | ICD-10-CM | POA: Diagnosis not present

## 2023-04-30 DIAGNOSIS — E782 Mixed hyperlipidemia: Secondary | ICD-10-CM | POA: Diagnosis not present

## 2023-04-30 DIAGNOSIS — M797 Fibromyalgia: Secondary | ICD-10-CM | POA: Diagnosis not present

## 2023-05-05 ENCOUNTER — Other Ambulatory Visit: Payer: Self-pay | Admitting: Family Medicine

## 2023-05-05 DIAGNOSIS — M8580 Other specified disorders of bone density and structure, unspecified site: Secondary | ICD-10-CM

## 2023-05-05 DIAGNOSIS — Z1231 Encounter for screening mammogram for malignant neoplasm of breast: Secondary | ICD-10-CM

## 2023-05-07 DIAGNOSIS — H401131 Primary open-angle glaucoma, bilateral, mild stage: Secondary | ICD-10-CM | POA: Diagnosis not present

## 2023-06-16 DIAGNOSIS — I10 Essential (primary) hypertension: Secondary | ICD-10-CM | POA: Diagnosis not present

## 2023-07-01 ENCOUNTER — Ambulatory Visit: Payer: BC Managed Care – PPO

## 2023-07-17 DIAGNOSIS — N3021 Other chronic cystitis with hematuria: Secondary | ICD-10-CM | POA: Diagnosis not present

## 2023-07-17 DIAGNOSIS — R300 Dysuria: Secondary | ICD-10-CM | POA: Diagnosis not present

## 2023-07-22 ENCOUNTER — Ambulatory Visit
Admission: RE | Admit: 2023-07-22 | Discharge: 2023-07-22 | Disposition: A | Discharge status: Home / Self Care | Payer: BC Managed Care – PPO | Source: Ambulatory Visit | Attending: Family Medicine | Admitting: Family Medicine

## 2023-07-22 DIAGNOSIS — Z1231 Encounter for screening mammogram for malignant neoplasm of breast: Secondary | ICD-10-CM

## 2023-07-22 DIAGNOSIS — Z96652 Presence of left artificial knee joint: Secondary | ICD-10-CM | POA: Diagnosis not present

## 2023-08-17 DIAGNOSIS — R944 Abnormal results of kidney function studies: Secondary | ICD-10-CM | POA: Diagnosis not present

## 2023-09-01 DIAGNOSIS — N3021 Other chronic cystitis with hematuria: Secondary | ICD-10-CM | POA: Diagnosis not present

## 2023-09-01 DIAGNOSIS — N952 Postmenopausal atrophic vaginitis: Secondary | ICD-10-CM | POA: Diagnosis not present

## 2023-09-01 DIAGNOSIS — N3020 Other chronic cystitis without hematuria: Secondary | ICD-10-CM | POA: Diagnosis not present

## 2023-09-24 DIAGNOSIS — N3000 Acute cystitis without hematuria: Secondary | ICD-10-CM | POA: Diagnosis not present

## 2023-09-24 DIAGNOSIS — R300 Dysuria: Secondary | ICD-10-CM | POA: Diagnosis not present

## 2023-09-24 DIAGNOSIS — H401131 Primary open-angle glaucoma, bilateral, mild stage: Secondary | ICD-10-CM | POA: Diagnosis not present

## 2023-10-30 DIAGNOSIS — E782 Mixed hyperlipidemia: Secondary | ICD-10-CM | POA: Diagnosis not present

## 2023-10-30 DIAGNOSIS — I10 Essential (primary) hypertension: Secondary | ICD-10-CM | POA: Diagnosis not present

## 2023-10-30 DIAGNOSIS — R5383 Other fatigue: Secondary | ICD-10-CM | POA: Diagnosis not present

## 2023-10-30 DIAGNOSIS — E538 Deficiency of other specified B group vitamins: Secondary | ICD-10-CM | POA: Diagnosis not present

## 2023-10-30 DIAGNOSIS — Z87440 Personal history of urinary (tract) infections: Secondary | ICD-10-CM | POA: Diagnosis not present

## 2023-11-21 DIAGNOSIS — N390 Urinary tract infection, site not specified: Secondary | ICD-10-CM | POA: Diagnosis not present

## 2023-12-14 ENCOUNTER — Ambulatory Visit
Admission: RE | Admit: 2023-12-14 | Discharge: 2023-12-14 | Disposition: A | Discharge status: Home / Self Care | Payer: BC Managed Care – PPO | Source: Ambulatory Visit | Attending: Family Medicine | Admitting: Family Medicine

## 2023-12-14 DIAGNOSIS — N958 Other specified menopausal and perimenopausal disorders: Secondary | ICD-10-CM | POA: Diagnosis not present

## 2023-12-14 DIAGNOSIS — M8588 Other specified disorders of bone density and structure, other site: Secondary | ICD-10-CM | POA: Diagnosis not present

## 2023-12-14 DIAGNOSIS — E2839 Other primary ovarian failure: Secondary | ICD-10-CM | POA: Diagnosis not present

## 2023-12-14 DIAGNOSIS — M8580 Other specified disorders of bone density and structure, unspecified site: Secondary | ICD-10-CM

## 2024-01-11 DIAGNOSIS — R300 Dysuria: Secondary | ICD-10-CM | POA: Diagnosis not present

## 2024-01-11 DIAGNOSIS — B9620 Unspecified Escherichia coli [E. coli] as the cause of diseases classified elsewhere: Secondary | ICD-10-CM | POA: Diagnosis not present

## 2024-01-11 DIAGNOSIS — N390 Urinary tract infection, site not specified: Secondary | ICD-10-CM | POA: Diagnosis not present

## 2024-01-11 DIAGNOSIS — N952 Postmenopausal atrophic vaginitis: Secondary | ICD-10-CM | POA: Diagnosis not present

## 2024-01-11 DIAGNOSIS — N3021 Other chronic cystitis with hematuria: Secondary | ICD-10-CM | POA: Diagnosis not present

## 2024-01-11 DIAGNOSIS — R102 Pelvic and perineal pain: Secondary | ICD-10-CM | POA: Diagnosis not present

## 2024-01-21 DIAGNOSIS — H401131 Primary open-angle glaucoma, bilateral, mild stage: Secondary | ICD-10-CM | POA: Diagnosis not present

## 2024-04-04 DIAGNOSIS — N3021 Other chronic cystitis with hematuria: Secondary | ICD-10-CM | POA: Diagnosis not present

## 2024-04-04 DIAGNOSIS — N952 Postmenopausal atrophic vaginitis: Secondary | ICD-10-CM | POA: Diagnosis not present

## 2024-04-04 DIAGNOSIS — R102 Pelvic and perineal pain: Secondary | ICD-10-CM | POA: Diagnosis not present

## 2024-05-13 DIAGNOSIS — Z0000 Encounter for general adult medical examination without abnormal findings: Secondary | ICD-10-CM | POA: Diagnosis not present

## 2024-05-13 DIAGNOSIS — E538 Deficiency of other specified B group vitamins: Secondary | ICD-10-CM | POA: Diagnosis not present

## 2024-05-13 DIAGNOSIS — E782 Mixed hyperlipidemia: Secondary | ICD-10-CM | POA: Diagnosis not present

## 2024-05-13 DIAGNOSIS — I10 Essential (primary) hypertension: Secondary | ICD-10-CM | POA: Diagnosis not present

## 2024-05-13 DIAGNOSIS — F5101 Primary insomnia: Secondary | ICD-10-CM | POA: Diagnosis not present

## 2024-05-13 DIAGNOSIS — M797 Fibromyalgia: Secondary | ICD-10-CM | POA: Diagnosis not present

## 2024-05-13 DIAGNOSIS — Z0184 Encounter for antibody response examination: Secondary | ICD-10-CM | POA: Diagnosis not present

## 2024-05-13 DIAGNOSIS — E559 Vitamin D deficiency, unspecified: Secondary | ICD-10-CM | POA: Diagnosis not present

## 2024-05-25 DIAGNOSIS — R42 Dizziness and giddiness: Secondary | ICD-10-CM | POA: Diagnosis not present

## 2024-05-25 DIAGNOSIS — M542 Cervicalgia: Secondary | ICD-10-CM | POA: Diagnosis not present

## 2024-05-25 DIAGNOSIS — R519 Headache, unspecified: Secondary | ICD-10-CM | POA: Diagnosis not present

## 2024-05-25 DIAGNOSIS — I10 Essential (primary) hypertension: Secondary | ICD-10-CM | POA: Diagnosis not present

## 2024-06-29 ENCOUNTER — Other Ambulatory Visit: Payer: Self-pay | Admitting: Family Medicine

## 2024-06-29 DIAGNOSIS — Z1231 Encounter for screening mammogram for malignant neoplasm of breast: Secondary | ICD-10-CM

## 2024-07-28 ENCOUNTER — Ambulatory Visit

## 2024-08-03 ENCOUNTER — Ambulatory Visit
Admission: RE | Admit: 2024-08-03 | Discharge: 2024-08-03 | Disposition: A | Discharge status: Home / Self Care | Source: Ambulatory Visit | Attending: Family Medicine | Admitting: Family Medicine

## 2024-08-03 DIAGNOSIS — Z1231 Encounter for screening mammogram for malignant neoplasm of breast: Secondary | ICD-10-CM

## 2024-08-08 ENCOUNTER — Other Ambulatory Visit: Payer: Self-pay | Admitting: Family Medicine

## 2024-08-08 DIAGNOSIS — R928 Other abnormal and inconclusive findings on diagnostic imaging of breast: Secondary | ICD-10-CM

## 2024-08-13 ENCOUNTER — Other Ambulatory Visit

## 2024-08-13 ENCOUNTER — Encounter

## 2024-08-17 ENCOUNTER — Ambulatory Visit
Admission: RE | Admit: 2024-08-17 | Discharge: 2024-08-17 | Disposition: A | Discharge status: Home / Self Care | Source: Ambulatory Visit | Attending: Family Medicine | Admitting: Family Medicine

## 2024-08-17 DIAGNOSIS — R928 Other abnormal and inconclusive findings on diagnostic imaging of breast: Secondary | ICD-10-CM

## 2024-08-17 DIAGNOSIS — N6489 Other specified disorders of breast: Secondary | ICD-10-CM | POA: Diagnosis not present

## 2024-11-23 ENCOUNTER — Ambulatory Visit: Admitting: Podiatry

## 2024-11-24 ENCOUNTER — Ambulatory Visit: Admitting: Podiatry

## 2024-11-25 ENCOUNTER — Ambulatory Visit: Admitting: Podiatry

## 2024-11-28 ENCOUNTER — Ambulatory Visit: Admitting: Podiatry

## 2024-12-01 ENCOUNTER — Other Ambulatory Visit: Payer: Self-pay | Admitting: Internal Medicine

## 2024-12-02 ENCOUNTER — Other Ambulatory Visit: Payer: Self-pay | Admitting: Family Medicine

## 2024-12-02 DIAGNOSIS — R1013 Epigastric pain: Secondary | ICD-10-CM

## 2024-12-15 ENCOUNTER — Other Ambulatory Visit

## 2025-01-03 ENCOUNTER — Other Ambulatory Visit
